# Patient Record
Sex: Female | Born: 1960 | Race: White | Hispanic: No | Marital: Married | Smoking: Never smoker
Health system: Southern US, Community
[De-identification: ages and names within clinical notes are randomized; demographics above are authoritative.]

## PROBLEM LIST (undated history)

## (undated) DIAGNOSIS — N809 Endometriosis, unspecified: Secondary | ICD-10-CM

## (undated) DIAGNOSIS — G43909 Migraine, unspecified, not intractable, without status migrainosus: Secondary | ICD-10-CM

## (undated) DIAGNOSIS — M3500 Sicca syndrome, unspecified: Secondary | ICD-10-CM

## (undated) DIAGNOSIS — Z87442 Personal history of urinary calculi: Secondary | ICD-10-CM

## (undated) DIAGNOSIS — F329 Major depressive disorder, single episode, unspecified: Secondary | ICD-10-CM

## (undated) DIAGNOSIS — D259 Leiomyoma of uterus, unspecified: Secondary | ICD-10-CM

## (undated) DIAGNOSIS — E039 Hypothyroidism, unspecified: Secondary | ICD-10-CM

## (undated) DIAGNOSIS — Z8669 Personal history of other diseases of the nervous system and sense organs: Secondary | ICD-10-CM

## (undated) DIAGNOSIS — K219 Gastro-esophageal reflux disease without esophagitis: Secondary | ICD-10-CM

## (undated) DIAGNOSIS — N9489 Other specified conditions associated with female genital organs and menstrual cycle: Secondary | ICD-10-CM

## (undated) DIAGNOSIS — R011 Cardiac murmur, unspecified: Secondary | ICD-10-CM

## (undated) DIAGNOSIS — M1990 Unspecified osteoarthritis, unspecified site: Secondary | ICD-10-CM

## (undated) DIAGNOSIS — G4700 Insomnia, unspecified: Secondary | ICD-10-CM

## (undated) DIAGNOSIS — M19072 Primary osteoarthritis, left ankle and foot: Secondary | ICD-10-CM

## (undated) DIAGNOSIS — F32A Depression, unspecified: Secondary | ICD-10-CM

## (undated) DIAGNOSIS — M19042 Primary osteoarthritis, left hand: Secondary | ICD-10-CM

## (undated) DIAGNOSIS — M9909 Segmental and somatic dysfunction of abdomen and other regions: Secondary | ICD-10-CM

## (undated) DIAGNOSIS — N29 Other disorders of kidney and ureter in diseases classified elsewhere: Secondary | ICD-10-CM

## (undated) DIAGNOSIS — N189 Chronic kidney disease, unspecified: Secondary | ICD-10-CM

## (undated) DIAGNOSIS — S82143A Displaced bicondylar fracture of unspecified tibia, initial encounter for closed fracture: Secondary | ICD-10-CM

## (undated) DIAGNOSIS — M19071 Primary osteoarthritis, right ankle and foot: Secondary | ICD-10-CM

## (undated) DIAGNOSIS — N950 Postmenopausal bleeding: Secondary | ICD-10-CM

## (undated) DIAGNOSIS — E559 Vitamin D deficiency, unspecified: Secondary | ICD-10-CM

## (undated) DIAGNOSIS — D219 Benign neoplasm of connective and other soft tissue, unspecified: Secondary | ICD-10-CM

## (undated) DIAGNOSIS — M170 Bilateral primary osteoarthritis of knee: Secondary | ICD-10-CM

## (undated) DIAGNOSIS — M19041 Primary osteoarthritis, right hand: Secondary | ICD-10-CM

## (undated) HISTORY — DX: Insomnia, unspecified: G47.00

## (undated) HISTORY — DX: Primary osteoarthritis, left hand: M19.042

## (undated) HISTORY — DX: Endometriosis, unspecified: N80.9

## (undated) HISTORY — DX: Gastro-esophageal reflux disease without esophagitis: K21.9

## (undated) HISTORY — DX: Other disorders of kidney and ureter in diseases classified elsewhere: N29

## (undated) HISTORY — DX: Hypothyroidism, unspecified: E03.9

## (undated) HISTORY — DX: Bilateral primary osteoarthritis of knee: M17.0

## (undated) HISTORY — DX: Vitamin D deficiency, unspecified: E55.9

## (undated) HISTORY — DX: Primary osteoarthritis, right hand: M19.041

## (undated) HISTORY — DX: Unspecified osteoarthritis, unspecified site: M19.90

## (undated) HISTORY — DX: Cardiac murmur, unspecified: R01.1

## (undated) HISTORY — DX: Primary osteoarthritis, left ankle and foot: M19.072

## (undated) HISTORY — DX: Benign neoplasm of connective and other soft tissue, unspecified: D21.9

## (undated) HISTORY — DX: Chronic kidney disease, unspecified: N18.9

## (undated) HISTORY — DX: Primary osteoarthritis, right ankle and foot: M19.071

## (undated) HISTORY — DX: Sicca syndrome, unspecified: M35.00

## (undated) HISTORY — DX: Major depressive disorder, single episode, unspecified: F32.9

## (undated) HISTORY — PX: DIAGNOSTIC LAPAROSCOPY: SUR761

## (undated) HISTORY — DX: Other disorders of calcium metabolism: E83.59

## (undated) HISTORY — DX: Depression, unspecified: F32.A

## (undated) HISTORY — DX: Migraine, unspecified, not intractable, without status migrainosus: G43.909

## (undated) HISTORY — DX: Displaced bicondylar fracture of unspecified tibia, initial encounter for closed fracture: S82.143A

---

## 1991-10-29 HISTORY — PX: PELVIC LAPAROSCOPY: SHX162

## 1992-10-28 HISTORY — PX: LAPAROSCOPY: SHX197

## 1999-01-17 ENCOUNTER — Encounter: Payer: Self-pay | Admitting: Gynecology

## 1999-01-17 ENCOUNTER — Ambulatory Visit (HOSPITAL_COMMUNITY): Admission: RE | Admit: 1999-01-17 | Discharge: 1999-01-17 | Payer: Self-pay | Admitting: Gynecology

## 2002-05-19 ENCOUNTER — Other Ambulatory Visit: Admission: RE | Admit: 2002-05-19 | Discharge: 2002-05-19 | Payer: Self-pay | Admitting: Gynecology

## 2003-02-21 ENCOUNTER — Encounter: Payer: Self-pay | Admitting: Gynecology

## 2003-02-21 ENCOUNTER — Ambulatory Visit (HOSPITAL_COMMUNITY): Admission: RE | Admit: 2003-02-21 | Discharge: 2003-02-21 | Payer: Self-pay | Admitting: Gynecology

## 2004-01-23 ENCOUNTER — Other Ambulatory Visit: Admission: RE | Admit: 2004-01-23 | Discharge: 2004-01-23 | Payer: Self-pay | Admitting: Gynecology

## 2004-04-06 ENCOUNTER — Ambulatory Visit (HOSPITAL_COMMUNITY): Admission: RE | Admit: 2004-04-06 | Discharge: 2004-04-06 | Payer: Self-pay | Admitting: Gynecology

## 2005-03-14 ENCOUNTER — Other Ambulatory Visit: Admission: RE | Admit: 2005-03-14 | Discharge: 2005-03-14 | Payer: Self-pay | Admitting: Gynecology

## 2006-09-26 ENCOUNTER — Ambulatory Visit (HOSPITAL_COMMUNITY): Admission: RE | Admit: 2006-09-26 | Discharge: 2006-09-26 | Payer: Self-pay | Admitting: Gynecology

## 2006-10-14 ENCOUNTER — Other Ambulatory Visit: Admission: RE | Admit: 2006-10-14 | Discharge: 2006-10-14 | Payer: Self-pay | Admitting: Gynecology

## 2008-01-08 ENCOUNTER — Ambulatory Visit (HOSPITAL_COMMUNITY): Admission: RE | Admit: 2008-01-08 | Discharge: 2008-01-08 | Payer: Self-pay | Admitting: Gynecology

## 2008-04-05 ENCOUNTER — Other Ambulatory Visit: Admission: RE | Admit: 2008-04-05 | Discharge: 2008-04-05 | Payer: Self-pay | Admitting: Gynecology

## 2009-02-14 ENCOUNTER — Ambulatory Visit (HOSPITAL_COMMUNITY): Admission: RE | Admit: 2009-02-14 | Discharge: 2009-02-14 | Payer: Self-pay | Admitting: Orthopedic Surgery

## 2010-11-12 ENCOUNTER — Ambulatory Visit (HOSPITAL_COMMUNITY)
Admission: RE | Admit: 2010-11-12 | Discharge: 2010-11-12 | Payer: Self-pay | Source: Home / Self Care | Attending: Unknown Physician Specialty | Admitting: Unknown Physician Specialty

## 2012-04-29 ENCOUNTER — Encounter: Payer: Self-pay | Admitting: Internal Medicine

## 2012-04-29 ENCOUNTER — Ambulatory Visit (INDEPENDENT_AMBULATORY_CARE_PROVIDER_SITE_OTHER): Payer: 59 | Admitting: Internal Medicine

## 2012-04-29 ENCOUNTER — Ambulatory Visit (HOSPITAL_BASED_OUTPATIENT_CLINIC_OR_DEPARTMENT_OTHER)
Admission: RE | Admit: 2012-04-29 | Discharge: 2012-04-29 | Disposition: A | Discharge status: Home / Self Care | Payer: 59 | Source: Ambulatory Visit | Attending: Internal Medicine | Admitting: Internal Medicine

## 2012-04-29 VITALS — BP 148/80 | HR 65 | Temp 97.3°F | Resp 16 | Ht 66.0 in | Wt 161.0 lb

## 2012-04-29 DIAGNOSIS — M2550 Pain in unspecified joint: Secondary | ICD-10-CM

## 2012-04-29 DIAGNOSIS — R454 Irritability and anger: Secondary | ICD-10-CM

## 2012-04-29 DIAGNOSIS — D72819 Decreased white blood cell count, unspecified: Secondary | ICD-10-CM

## 2012-04-29 DIAGNOSIS — J309 Allergic rhinitis, unspecified: Secondary | ICD-10-CM

## 2012-04-29 DIAGNOSIS — G5620 Lesion of ulnar nerve, unspecified upper limb: Secondary | ICD-10-CM

## 2012-04-29 DIAGNOSIS — K219 Gastro-esophageal reflux disease without esophagitis: Secondary | ICD-10-CM

## 2012-04-29 DIAGNOSIS — E039 Hypothyroidism, unspecified: Secondary | ICD-10-CM

## 2012-04-29 DIAGNOSIS — Z8669 Personal history of other diseases of the nervous system and sense organs: Secondary | ICD-10-CM

## 2012-04-29 DIAGNOSIS — Z87442 Personal history of urinary calculi: Secondary | ICD-10-CM

## 2012-04-29 DIAGNOSIS — Z1231 Encounter for screening mammogram for malignant neoplasm of breast: Secondary | ICD-10-CM | POA: Insufficient documentation

## 2012-04-29 DIAGNOSIS — Z139 Encounter for screening, unspecified: Secondary | ICD-10-CM

## 2012-04-29 LAB — TSH: TSH: 1.712 u[IU]/mL (ref 0.350–4.500)

## 2012-04-29 LAB — CBC WITH DIFFERENTIAL/PLATELET
Basophils Absolute: 0 10*3/uL (ref 0.0–0.1)
Basophils Relative: 0 % (ref 0–1)
Eosinophils Absolute: 0.1 10*3/uL (ref 0.0–0.7)
HCT: 40.5 % (ref 36.0–46.0)
MCHC: 33.6 g/dL (ref 30.0–36.0)
Monocytes Absolute: 0.4 10*3/uL (ref 0.1–1.0)
Neutro Abs: 2.6 10*3/uL (ref 1.7–7.7)
RDW: 13.1 % (ref 11.5–15.5)

## 2012-04-29 LAB — URIC ACID: Uric Acid, Serum: 2.5 mg/dL (ref 2.4–7.0)

## 2012-04-29 MED ORDER — NABUMETONE 500 MG PO TABS: 500.0000 mg | ORAL_TABLET | Freq: Two times a day (BID) | ORAL | Status: AC

## 2012-04-30 LAB — RHEUMATOID FACTOR: Rhuematoid fact SerPl-aCnc: <10 IU/mL (ref <=14)

## 2012-04-30 LAB — COMPLETE METABOLIC PANEL WITH GFR
AST: 15 U/L (ref 0–37)
Albumin: 4.6 g/dL (ref 3.5–5.2)
Alkaline Phosphatase: 73 U/L (ref 39–117)
BUN: 11 mg/dL (ref 6–23)
Creat: 0.52 mg/dL (ref 0.50–1.10)
Potassium: 4.5 mEq/L (ref 3.5–5.3)
Total Bilirubin: 0.3 mg/dL (ref 0.3–1.2)

## 2012-04-30 LAB — VITAMIN D 25 HYDROXY (VIT D DEFICIENCY, FRACTURES): Vit D, 25-Hydroxy: 31 ng/mL (ref 30–89)

## 2012-05-01 ENCOUNTER — Encounter: Payer: Self-pay | Admitting: Internal Medicine

## 2012-05-01 DIAGNOSIS — G5620 Lesion of ulnar nerve, unspecified upper limb: Secondary | ICD-10-CM | POA: Insufficient documentation

## 2012-05-01 DIAGNOSIS — R454 Irritability and anger: Secondary | ICD-10-CM | POA: Insufficient documentation

## 2012-05-01 DIAGNOSIS — K219 Gastro-esophageal reflux disease without esophagitis: Secondary | ICD-10-CM | POA: Insufficient documentation

## 2012-05-01 DIAGNOSIS — J309 Allergic rhinitis, unspecified: Secondary | ICD-10-CM | POA: Insufficient documentation

## 2012-05-01 DIAGNOSIS — Z87442 Personal history of urinary calculi: Secondary | ICD-10-CM | POA: Insufficient documentation

## 2012-05-01 DIAGNOSIS — D72819 Decreased white blood cell count, unspecified: Secondary | ICD-10-CM | POA: Insufficient documentation

## 2012-05-01 DIAGNOSIS — Z8669 Personal history of other diseases of the nervous system and sense organs: Secondary | ICD-10-CM | POA: Insufficient documentation

## 2012-05-01 DIAGNOSIS — E039 Hypothyroidism, unspecified: Secondary | ICD-10-CM | POA: Insufficient documentation

## 2012-05-01 NOTE — Progress Notes (Signed)
Subjective:    Patient ID: Shelly [name], female    DOB: 08-11-1961, 51 y.o.   MRN: [id number]  HPI Shelly [name] is here for first visit to establish primary care.  PMH of GERD controlled with Zantac,allergic rhinitis, irritability treated with Wellbutrin in the past,  Migraine headache treated with Excedrin Migraine, hypothyroidism diagnosed by blood test at age [age] yo., chronic low WBC per her report, R renal calculi in past, and chronic arthralgias.  She was told by her neurologist Dr. 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    Shelly [name] continues to have chronic arthralgias although she denies joint warmth or redness.  Occasionally fingers swell,  She does have an aunt with RA.  She has never seen a rheumatologist but would like to be referred to one.  She take Ibuprofen fairly regularly.    She also has noted 15 lb weight gain over last 1.5 years and would like her thyroid checked since it has been a while.  She is behind on preventive exams, such as pap and mammmogram  Allergies  Allergen Reactions  . Compazine (Prochlorperazine Edisylate) Other (See Comments)    Muscle contractions   Past Medical History  Diagnosis Date  . Depression   . Migraine   . GERD (gastroesophageal reflux disease)   . Hypothyroid    Past Surgical History  Procedure Date  . Laparoscopy 1994    endometriosis   History   Social History  . Marital Status: Married    Spouse Name: N/A    Number of Children: N/A  . Years of Education: N/A   Occupational History  . Not on file.   Social History Main Topics  . Smoking status: Never Smoker   . Smokeless tobacco: Never Used  . Alcohol Use: No  . Drug Use: No  . Sexually Active: Yes -- Female partner(s)   Other Topics Concern  . Not on file   Social History Narrative  . No narrative on file   Family History  Problem Relation Age of Onset  . Hypertension Mother   . Hypertension Father   . Hypertension Brother   .  Hypertension Maternal Grandmother   . Hypertension Maternal Grandfather   . Kidney failure Maternal Grandfather   . Aneurysm Maternal Grandfather   . Stroke Father   . Cancer Maternal Aunt 42    breast  . Cancer Cousin     breast   Patient Active Problem List  Diagnosis  . Irritability  . GERD (gastroesophageal reflux disease)  . History of migraine  . Hypothyroidism  . History of renal calculi  . Leukopenia  . Ulnar nerve entrapment at elbow   Current Outpatient Prescriptions on File Prior to Visit  Medication Sig Dispense Refill  . amitriptyline (ELAVIL) 10 MG tablet Take 10 mg by mouth at bedtime. Takes 30mg  @@ hs      . levothyroxine (SYNTHROID, LEVOTHROID) 75 MCG tablet Take 75 mcg by mouth daily.      . ranitidine (ZANTAC) 75 MG tablet Take 75 mg by mouth daily.           Review of Systems    see HPI Objective:   Physical Exam Physical Exam  Nursing note and vitals reviewed.  Constitutional: She is oriented to person, place, and time. She appears well-developed and well-nourished.  HENT:  Head: Normocephalic and atraumatic.  Cardiovascular: Normal rate and regular rhythm. Exam reveals no gallop and no friction rub.  No  murmur heard.  Pulmonary/Chest: Breath sounds normal. She has no wheezes. She has no rales.  Neurological: She is alert and oriented to person, place, and time.  Skin: Skin is warm and dry.  Psychiatric: She has a normal mood and affect. Her behavior is normal. M/S  No active synovitis to any joints              Assessment & Plan:  Chronic arthralgia  Will get arthritis panel and refer to rheumatolgy  Ok to take Relafen 500 mg bid with food and Zantac.  Advised of GI and renal long term side effect with NSAIDS .  She voices understanding  GERD  See above  Hypothyroid  Will check levels today  History of renal calculi  History of  Low WBC per pt  History of ulnar nerve entrapment  Will get mammgoram today and schedule CPE

## 2012-05-04 ENCOUNTER — Telehealth: Payer: Self-pay | Admitting: *Deleted

## 2012-05-04 NOTE — Telephone Encounter (Signed)
Copy of labs mailed to pt's home address. 

## 2012-05-13 ENCOUNTER — Other Ambulatory Visit: Payer: Self-pay | Admitting: *Deleted

## 2012-05-13 ENCOUNTER — Telehealth: Payer: Self-pay | Admitting: Internal Medicine

## 2012-05-13 DIAGNOSIS — E039 Hypothyroidism, unspecified: Secondary | ICD-10-CM

## 2012-05-13 MED ORDER — LEVOTHYROXINE SODIUM 75 MCG PO TABS: 75.0000 ug | ORAL_TABLET | Freq: Every day | ORAL | Status: DC

## 2012-05-13 NOTE — Telephone Encounter (Signed)
Pt states she had her blood work and would like to know if she can get a renewal on her Levothyroxine Sodium (Tab) SYNTHROID, LEVOTHROID 75 MCG Take 75 mcg by mouth daily. Please call her at [phone]... Thank s

## 2012-05-13 NOTE — Telephone Encounter (Signed)
Pt called requesting RF on Synthroid.  RX sent to [facility].

## 2012-05-26 ENCOUNTER — Ambulatory Visit (HOSPITAL_COMMUNITY)
Admission: RE | Admit: 2012-05-26 | Discharge: 2012-05-26 | Disposition: A | Discharge status: Home / Self Care | Payer: 59 | Source: Ambulatory Visit | Attending: Internal Medicine | Admitting: Internal Medicine

## 2012-05-26 ENCOUNTER — Ambulatory Visit (INDEPENDENT_AMBULATORY_CARE_PROVIDER_SITE_OTHER): Payer: 59 | Admitting: Internal Medicine

## 2012-05-26 VITALS — BP 128/76 | HR 88 | Temp 98.0°F | Resp 16 | Ht 66.0 in | Wt 161.0 lb

## 2012-05-26 DIAGNOSIS — R350 Frequency of micturition: Secondary | ICD-10-CM

## 2012-05-26 DIAGNOSIS — R1032 Left lower quadrant pain: Secondary | ICD-10-CM

## 2012-05-26 DIAGNOSIS — R82998 Other abnormal findings in urine: Secondary | ICD-10-CM

## 2012-05-26 DIAGNOSIS — N831 Corpus luteum cyst of ovary, unspecified side: Secondary | ICD-10-CM | POA: Insufficient documentation

## 2012-05-26 DIAGNOSIS — N949 Unspecified condition associated with female genital organs and menstrual cycle: Secondary | ICD-10-CM | POA: Insufficient documentation

## 2012-05-26 DIAGNOSIS — D259 Leiomyoma of uterus, unspecified: Secondary | ICD-10-CM | POA: Insufficient documentation

## 2012-05-26 DIAGNOSIS — R8281 Pyuria: Secondary | ICD-10-CM

## 2012-05-26 LAB — POCT URINALYSIS DIPSTICK
Glucose, UA: NEGATIVE
Ketones, UA: NEGATIVE
Spec Grav, UA: 1.01

## 2012-05-26 LAB — POCT UA - MICROSCOPIC ONLY

## 2012-05-26 MED ORDER — CIPROFLOXACIN HCL 250 MG PO TABS: 250.0000 mg | ORAL_TABLET | Freq: Two times a day (BID) | ORAL | Status: AC

## 2012-05-26 NOTE — Progress Notes (Signed)
Subjective:    Patient ID: Shelly [name], female    DOB: 12/14/1960, 51 y.o.   MRN: [id number]  HPIIntermittent left lower quadrant abdominal and pelvic pain for the past 5 days/worse in the past 24 hours Sometimes stabbing, sometimes colicky. For the past 3 days she has had dysuria and frequency without hematuria. No definite fever but some night sweats last night. History of kidney stones 15-20 years ago. Last menstrual period one week ago within normal limits/married one partner History of endometriosis with ablation several years ago/continues with dysmenorrhea No current vaginal discharge/at least one ovarian cysts in the past No diarrhea or constipation   Patient Active Problem List  Diagnosis  . Irritability  . GERD (gastroesophageal reflux disease)  . History of migraine  . Hypothyroidism  . History of renal calculi  . Leukopenia  . Ulnar nerve entrapment at elbow  . Allergic rhinitis due to allergen  Current outpatient prescriptions:amitriptyline (ELAVIL) 10 MG tablet, Take 10 mg by mouth at bedtime. Takes 30mg  @@ hs, Disp: , Rfl: ;  levothyroxine (SYNTHROID, LEVOTHROID) 75 MCG tablet, Take 1 tablet (75 mcg total) by mouth daily., Disp: 30 tablet, Rfl: 0;  nabumetone (RELAFEN) 500 MG tablet, Take 1 tablet (500 mg total) by mouth 2 (two) times daily., Disp: 60 tablet, Rfl: 1 ranitidine (ZANTAC) 75 MG tablet, Take 75 mg by mouth daily., Disp: , Rfl: ;   Review of Systems All systems as in present illness for staple    Objective:   Physical Exam Filed Vitals:   05/26/12 0927  BP: 128/76  Pulse: 88  Temp: 98 F (36.7 C)  Resp: 16  In no acute distress/she was in enough pain 30 minutes ago that she couldn't drive and her daughter had to bring her HEENT clear Heart regular without murmur Lungs clear Abdomen soft/bowel sounds present/no organomegaly Tender to palpation in the left lower quadrant with slight rebound/mildly tender suprapubic No costovertebral angle  tenderness        Results for orders placed in visit on 05/26/12  POCT UA - MICROSCOPIC ONLY      Component Value Range   WBC, Ur, HPF, POC 5-6     RBC, urine, microscopic 2-3     Bacteria, U Microscopic trace     Mucus, UA pos     Epithelial cells, urine per micros 1-2     Crystals, Ur, HPF, POC neg     Casts, Ur, LPF, POC neg     Yeast, UA neg    POCT URINALYSIS DIPSTICK      Component Value Range   Color, UA pale     Clarity, UA clear     Glucose, UA neg     Bilirubin, UA neg     Ketones, UA neg     Spec Grav, UA 1.010     Blood, UA trace     pH, UA 5.5     Protein, UA neg     Urobilinogen, UA 0.2     Nitrite, UA neg     Leukocytes, UA Trace      Assessment & Plan:   1. Urinary frequency  POCT UA - Microscopic Only, POCT urinalysis dipstick, Urine culture, US Pelvis Complete, US Transvaginal Non-OB  2. Abdominal pain, LLQ  US Pelvis Complete, US Transvaginal Non-OB   She will start Cipro 250 twice a day #20 and proceed to [place] long for pelvic ultrasound to rule out ovarian cyst Other considerations include urinary tract infection and kidney  stone at the UP junction

## 2012-05-28 ENCOUNTER — Encounter: Payer: Self-pay | Admitting: Internal Medicine

## 2012-05-28 LAB — URINE CULTURE
Colony Count: NO GROWTH
Organism ID, Bacteria: NO GROWTH

## 2012-06-04 ENCOUNTER — Encounter: Payer: Self-pay | Admitting: Internal Medicine

## 2012-06-04 ENCOUNTER — Ambulatory Visit (HOSPITAL_BASED_OUTPATIENT_CLINIC_OR_DEPARTMENT_OTHER)
Admission: RE | Admit: 2012-06-04 | Discharge: 2012-06-04 | Disposition: A | Discharge status: Home / Self Care | Payer: 59 | Source: Ambulatory Visit | Attending: Internal Medicine | Admitting: Internal Medicine

## 2012-06-04 ENCOUNTER — Ambulatory Visit (INDEPENDENT_AMBULATORY_CARE_PROVIDER_SITE_OTHER): Payer: 59 | Admitting: Internal Medicine

## 2012-06-04 VITALS — BP 130/86 | HR 83 | Temp 97.0°F | Resp 16 | Ht 66.0 in | Wt 161.0 lb

## 2012-06-04 DIAGNOSIS — R319 Hematuria, unspecified: Secondary | ICD-10-CM | POA: Insufficient documentation

## 2012-06-04 DIAGNOSIS — N201 Calculus of ureter: Secondary | ICD-10-CM | POA: Insufficient documentation

## 2012-06-04 DIAGNOSIS — N200 Calculus of kidney: Secondary | ICD-10-CM

## 2012-06-04 DIAGNOSIS — R350 Frequency of micturition: Secondary | ICD-10-CM

## 2012-06-04 DIAGNOSIS — N1330 Unspecified hydronephrosis: Secondary | ICD-10-CM | POA: Insufficient documentation

## 2012-06-04 LAB — POCT URINALYSIS DIPSTICK
Glucose, UA: NEGATIVE
Nitrite, UA: NEGATIVE
Protein, UA: NEGATIVE
Urobilinogen, UA: NEGATIVE

## 2012-06-04 LAB — BASIC METABOLIC PANEL
BUN: 16 mg/dL (ref 6–23)
Calcium: 8.9 mg/dL (ref 8.4–10.5)
Glucose, Bld: 86 mg/dL (ref 70–99)
Potassium: 4.7 mEq/L (ref 3.5–5.3)
Sodium: 137 mEq/L (ref 135–145)

## 2012-06-04 MED ORDER — OXYCODONE-ACETAMINOPHEN 5-325 MG PO TABS: 1.0000 | ORAL_TABLET | Freq: Three times a day (TID) | ORAL | Status: AC | PRN

## 2012-06-04 NOTE — Addendum Note (Signed)
Addended by: Raechel Chute D on: 06/04/2012 05:13 PM   Modules accepted: Orders

## 2012-06-04 NOTE — Progress Notes (Addendum)
Subjective:    Patient ID: Shelly [name], female    DOB: [birth year], 51 y.o.   MRN: [id number]  HPI Shelly [name] is here for acute visit.  She was evaluated at [facility] urgent care on 7/30 and treated with Cipro.  for UTI.  She continues to have LLQ pain radiating to suprapubic area.  Culture shows no growth.  She has history of kidney stones when she was 21 but no problems since  No fever No N/V.  Pain comes and goes and she is not in pain now.  Pt had plans to go out of town in 2 days  Allergies  Allergen Reactions  . Compazine (Prochlorperazine Edisylate) Other (See Comments)    Muscle contractions   Past Medical History  Diagnosis Date  . Depression   . Migraine   . GERD (gastroesophageal reflux disease)   . Hypothyroid    Past Surgical History  Procedure Date  . Laparoscopy 1994    endometriosis   History   Social History  . Marital Status: Married    Spouse Name: N/A    Number of Children: N/A  . Years of Education: N/A   Occupational History  . Not on file.   Social History Main Topics  . Smoking status: Never Smoker   . Smokeless tobacco: Never Used  . Alcohol Use: No  . Drug Use: No  . Sexually Active: Yes -- Female partner(s)   Other Topics Concern  . Not on file   Social History Narrative  . No narrative on file   Family History  Problem Relation Age of Onset  . Hypertension Mother   . Hypertension Father   . Hypertension Brother   . Hypertension Maternal Grandmother   . Hypertension Maternal Grandfather   . Kidney failure Maternal Grandfather   . Aneurysm Maternal Grandfather   . Stroke Father   . Cancer Maternal Aunt 42    breast  . Cancer Cousin     breast   Patient Active Problem List  Diagnosis  . Irritability  . GERD (gastroesophageal reflux disease)  . History of migraine  . Hypothyroidism  . History of renal calculi  . Leukopenia  . Ulnar nerve entrapment at elbow  . Allergic rhinitis due to allergen  . Renal calculus, left    Current Outpatient Prescriptions on File Prior to Visit  Medication Sig Dispense Refill  . amitriptyline (ELAVIL) 10 MG tablet Take 10 mg by mouth at bedtime. Takes 30mg  @@ hs      . ciprofloxacin (CIPRO) 250 MG tablet Take 1 tablet (250 mg total) by mouth 2 (two) times daily.  20 tablet  0  . levothyroxine (SYNTHROID, LEVOTHROID) 75 MCG tablet Take 1 tablet (75 mcg total) by mouth daily.  30 tablet  0  . nabumetone (RELAFEN) 500 MG tablet Take 1 tablet (500 mg total) by mouth 2 (two) times daily.  60 tablet  1  . ranitidine (ZANTAC) 75 MG tablet Take 75 mg by mouth daily.             Review of Systems See HPI    Objective:   Physical Exam Physical Exam  Nursing note and vitals reviewed.  Constitutional: She is oriented to person, place, and time. She appears well-developed and well-nourished.  HENT:  Head: Normocephalic and atraumatic.  Cardiovascular: Normal rate and regular rhythm. Exam reveals no gallop and no friction rub.  No murmur heard.  Pulmonary/Chest: Breath sounds normal. She has no wheezes. She has no rales.  ABD:  No CVA tenderness.  Mild tenderness cuprapubic area.  No rebound or peritoneal signs.  BS+ no HSM  Neurological: She is alert and oriented to person, place, and time.  Skin: Skin is warm and dry.  Psychiatric: She has a normal mood and affect. Her behavior is normal.              Assessment & Plan:  1)  ABd pain/ hematuria .  Will get ABd/pelvic CT today.  Suspect calculi  Check creatinine today Rx Percocet 5/325 1 q8 h prn  Hisotry of renal calculi   Addendum: See CT report  4 mm distal ureteral stone on L with mild hydronephrosis.  Pt notified via telephone and I spoke with Triage nurse at [facility] who will place pt on schedule in am.  Pt advised not to go to [facility] until evaluated by Urology  She voices understanding

## 2012-06-04 NOTE — Patient Instructions (Addendum)
To have CT today  Further plans based on results

## 2012-06-08 NOTE — Progress Notes (Signed)
Copy of labs mailed to pt's home address. 

## 2012-06-22 ENCOUNTER — Other Ambulatory Visit: Payer: Self-pay | Admitting: Internal Medicine

## 2012-06-22 DIAGNOSIS — E039 Hypothyroidism, unspecified: Secondary | ICD-10-CM

## 2012-06-22 MED ORDER — LEVOTHYROXINE SODIUM 75 MCG PO TABS: 75.0000 ug | ORAL_TABLET | Freq: Every day | ORAL | Status: DC

## 2012-06-22 NOTE — Telephone Encounter (Signed)
Pt needs refill for Levothyroxine 75 micrograms.  Needs 90 day supply for prescription. Call into [facility] [phone]. Pt phone number [phone].

## 2012-06-22 NOTE — Telephone Encounter (Signed)
Routed med RF to Dr Constance Goltz

## 2012-07-10 ENCOUNTER — Other Ambulatory Visit: Payer: Self-pay | Admitting: Urology

## 2012-07-21 ENCOUNTER — Ambulatory Visit (HOSPITAL_BASED_OUTPATIENT_CLINIC_OR_DEPARTMENT_OTHER): Admission: RE | Admit: 2012-07-21 | Payer: 59 | Source: Ambulatory Visit | Admitting: Urology

## 2012-07-21 ENCOUNTER — Encounter (HOSPITAL_BASED_OUTPATIENT_CLINIC_OR_DEPARTMENT_OTHER): Admission: RE | Payer: Self-pay | Source: Ambulatory Visit

## 2012-07-21 SURGERY — CYSTOURETEROSCOPY, WITH RETROGRADE PYELOGRAM AND STENT INSERTION
Anesthesia: General | Laterality: Left

## 2012-08-12 ENCOUNTER — Encounter: Payer: Self-pay | Admitting: Internal Medicine

## 2012-08-12 ENCOUNTER — Ambulatory Visit (INDEPENDENT_AMBULATORY_CARE_PROVIDER_SITE_OTHER): Payer: 59 | Admitting: Internal Medicine

## 2012-08-12 VITALS — BP 136/88 | HR 76 | Temp 97.7°F | Resp 16 | Wt 165.0 lb

## 2012-08-12 DIAGNOSIS — Z8249 Family history of ischemic heart disease and other diseases of the circulatory system: Secondary | ICD-10-CM | POA: Insufficient documentation

## 2012-08-12 DIAGNOSIS — K219 Gastro-esophageal reflux disease without esophagitis: Secondary | ICD-10-CM

## 2012-08-12 DIAGNOSIS — Z8669 Personal history of other diseases of the nervous system and sense organs: Secondary | ICD-10-CM

## 2012-08-12 DIAGNOSIS — Z0000 Encounter for general adult medical examination without abnormal findings: Secondary | ICD-10-CM

## 2012-08-12 DIAGNOSIS — Z124 Encounter for screening for malignant neoplasm of cervix: Secondary | ICD-10-CM

## 2012-08-12 DIAGNOSIS — Z1151 Encounter for screening for human papillomavirus (HPV): Secondary | ICD-10-CM

## 2012-08-12 DIAGNOSIS — J309 Allergic rhinitis, unspecified: Secondary | ICD-10-CM

## 2012-08-12 DIAGNOSIS — E039 Hypothyroidism, unspecified: Secondary | ICD-10-CM

## 2012-08-12 DIAGNOSIS — N200 Calculus of kidney: Secondary | ICD-10-CM

## 2012-08-12 LAB — POCT URINALYSIS DIPSTICK
Ketones, UA: NEGATIVE
Leukocytes, UA: NEGATIVE
Nitrite, UA: NEGATIVE
Protein, UA: NEGATIVE
pH, UA: 6.5

## 2012-08-12 LAB — LIPID PANEL
HDL: 49 mg/dL (ref >39)
LDL Cholesterol: 107 mg/dL — ABNORMAL HIGH (ref 0–99)

## 2012-08-12 NOTE — Progress Notes (Signed)
Subjective:    Patient ID: Shelly [name], female    DOB: 11/20/1960, 51 y.o.   MRN: [id number]  HPI [name] is here for comprehensive eval.   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  She did see Dr. Annabell Howells  She has seen Dr. Corliss Skains for her arthraligias  She is on high dose vitamind D now  She reports strong FH of AAA  GF, 2 uncles,  Cousin died of AAA and her aunt.    Migraine controlled with Excedrin migraine  Hypothyroidism  TSH normal in summer  Allergies  Allergen Reactions  . Compazine (Prochlorperazine Edisylate) Other (See Comments)    Muscle contractions   Past Medical History  Diagnosis Date  . Depression   . Migraine   . GERD (gastroesophageal reflux disease)   . Hypothyroid    Past Surgical History  Procedure Date  . Laparoscopy 1994    endometriosis   History   Social History  . Marital Status: Married    Spouse Name: N/A    Number of Children: N/A  . Years of Education: N/A   Occupational History  . Not on file.   Social History Main Topics  . Smoking status: Never Smoker   . Smokeless tobacco: Never Used  . Alcohol Use: No  . Drug Use: No  . Sexually Active: Yes -- Female partner(s)   Other Topics Concern  . Not on file   Social History Narrative  . No narrative on file   Family History  Problem Relation Age of Onset  . Hypertension Mother   . Hypertension Father   . Hypertension Brother   . Hypertension Maternal Grandmother   . Hypertension Maternal Grandfather   . Kidney failure Maternal Grandfather   . Aneurysm Maternal Grandfather   . Stroke Father   . Cancer Maternal Aunt 42    breast  . Cancer Cousin     breast   Patient Active Problem List  Diagnosis  . Irritability  . GERD (gastroesophageal reflux disease)  . History of migraine  . Hypothyroidism  . History of renal calculi  . Leukopenia  . Ulnar nerve entrapment at elbow  . Allergic rhinitis due to allergen  .  Renal calculus, left  . Family history of abdominal aortic aneurysm   Current Outpatient Prescriptions on File Prior to Visit  Medication Sig Dispense Refill  . amitriptyline (ELAVIL) 10 MG tablet Take 10 mg by mouth at bedtime. Takes 30mg  @@ hs      . levothyroxine (SYNTHROID, LEVOTHROID) 75 MCG tablet Take 1 tablet (75 mcg total) by mouth daily.  90 tablet  3  . nabumetone (RELAFEN) 500 MG tablet Take 1 tablet (500 mg total) by mouth 2 (two) times daily.  60 tablet  1  . ranitidine (ZANTAC) 75 MG tablet Take 75 mg by mouth daily.           Review of Systems  All other systems reviewed and are negative.       Objective:   Physical Exam  Physical Exam  Vital signs and nursing note reviewed  Constitutional: She is oriented to person, place, and time. She appears well-developed and well-nourished. She is cooperative.  HENT:  Head: Normocephalic and atraumatic.  Right Ear: Tympanic membrane normal.  Left Ear: Tympanic membrane normal.  Nose: Nose normal.  Mouth/Throat: Oropharynx is clear and moist and mucous membranes are normal. No oropharyngeal exudate or posterior oropharyngeal erythema.  Eyes: Conjunctivae and EOM are normal. Pupils are equal, round, and reactive to light.  Neck: Neck supple. No JVD present. Carotid bruit is not present. No mass and no thyromegaly present.  Cardiovascular: Regular rhythm, normal heart sounds, intact distal pulses and normal pulses.  Exam reveals no gallop and no friction rub.   No murmur heard. Pulses:      Dorsalis pedis pulses are 2+ on the right side, and 2+ on the left side.  Pulmonary/Chest: Breath sounds normal. She has no wheezes. She has no rhonchi. She has no rales. Right breast exhibits no mass, no nipple discharge and no skin change. Left breast exhibits no mass, no nipple discharge and no skin change.  Abdominal: Soft. Bowel sounds are normal. She exhibits no distension and no mass. There is no hepatosplenomegaly. There is no  tenderness. There is no CVA tenderness.  Genitourinary: Rectum normal, vagina normal and uterus normal. Rectal exam shows no mass. Guaiac negative stool. No labial fusion. There is no lesion on the right labia. There is no lesion on the left labia. Cervix exhibits no motion tenderness. Right adnexum displays no mass, no tenderness and no fullness. Left adnexum displays no mass, no tenderness and no fullness. No erythema around the vagina.  Musculoskeletal:       No active synovitis to any joint.    Lymphadenopathy:       Right cervical: No superficial cervical adenopathy present.      Left cervical: No superficial cervical adenopathy present.       Right axillary: No pectoral and no lateral adenopathy present.       Left axillary: No pectoral and no lateral adenopathy present.      Right: No inguinal adenopathy present.       Left: No inguinal adenopathy present.  Neurological: She is alert and oriented to person, place, and time. She has normal strength and normal reflexes. No cranial nerve deficit or sensory deficit. She displays a negative Romberg sign. Coordination and gait normal.  Skin: Skin is warm and dry. No abrasion, no bruising, no ecchymosis and no rash noted. No cyanosis. Nails show no clubbing.  Psychiatric: She has a normal mood and affect. Her speech is normal and behavior is normal.          Assessment & Plan:         Assessment & Plan:  Health Maintenance:  She will get flu vaccine by hospital.  UTD with mm pap today  See scanned HM sheet  Hypothyroidism  Continue  meds  Renal calculi  Managed by Dr. Annabell Howells  Arthralgias   Managed Dr. Corliss Skains  Will check Vitamin D today  Strong FH of AAA will get screening ultrasound  GERD  Continue  Zantac  If worsens she is to call

## 2012-08-12 NOTE — Patient Instructions (Addendum)
See me as needed  To have abd ultrasound  Stop by xray to schedule

## 2012-08-13 LAB — VITAMIN D 25 HYDROXY (VIT D DEFICIENCY, FRACTURES): Vit D, 25-Hydroxy: 47 ng/mL (ref 30–89)

## 2012-08-17 ENCOUNTER — Encounter: Payer: Self-pay | Admitting: *Deleted

## 2012-08-18 ENCOUNTER — Telehealth: Payer: Self-pay | Admitting: *Deleted

## 2012-08-18 ENCOUNTER — Encounter: Payer: Self-pay | Admitting: *Deleted

## 2012-08-18 NOTE — Telephone Encounter (Signed)
-   pap letter sent 

## 2012-08-18 NOTE — Telephone Encounter (Signed)
Message copied by Mathews Robinsons on Tue Aug 18, 2012 11:55 AM ------      Message from: Raechel Chute D      Created: Mon Aug 17, 2012  5:30 PM       Send pap letter that test was negative.  Repeat in two years

## 2012-08-19 ENCOUNTER — Telehealth: Payer: Self-pay | Admitting: *Deleted

## 2012-08-19 NOTE — Telephone Encounter (Signed)
-  pap letter sent to pt home address 

## 2012-08-31 ENCOUNTER — Telehealth: Payer: Self-pay | Admitting: *Deleted

## 2012-08-31 NOTE — Telephone Encounter (Signed)
Pt has not had [place] completed yet

## 2012-09-14 ENCOUNTER — Telehealth: Payer: Self-pay | Admitting: *Deleted

## 2012-09-14 ENCOUNTER — Ambulatory Visit (HOSPITAL_BASED_OUTPATIENT_CLINIC_OR_DEPARTMENT_OTHER)
Admission: RE | Admit: 2012-09-14 | Discharge: 2012-09-14 | Disposition: A | Discharge status: Home / Self Care | Payer: 59 | Source: Ambulatory Visit | Attending: Internal Medicine | Admitting: Internal Medicine

## 2012-09-14 ENCOUNTER — Other Ambulatory Visit: Payer: Self-pay | Admitting: Internal Medicine

## 2012-09-14 DIAGNOSIS — Z8249 Family history of ischemic heart disease and other diseases of the circulatory system: Secondary | ICD-10-CM

## 2012-09-14 NOTE — Telephone Encounter (Signed)
Informed pt - [place] report

## 2012-09-14 NOTE — Telephone Encounter (Signed)
Message copied by Mathews Robinsons on Mon Sep 14, 2012  3:08 PM ------      Message from: Raechel Chute D      Created: Mon Sep 14, 2012 12:34 PM       Call Inetta Fermo and let her know that her abd aortic ultrasound shows no aneurysm            Thanks

## 2012-10-07 ENCOUNTER — Other Ambulatory Visit: Payer: Self-pay | Admitting: *Deleted

## 2012-10-07 MED ORDER — FLUTICASONE PROPIONATE 50 MCG/ACT NA SUSP: 2.0000 | Freq: Every day | NASAL | Status: DC

## 2012-10-07 NOTE — Telephone Encounter (Signed)
Pt is also requesting fluticasone 50 mcg

## 2012-10-07 NOTE — Telephone Encounter (Signed)
Shelly [name]  Check to see if 25 mg of Elavil is OK

## 2012-10-08 ENCOUNTER — Telehealth: Payer: Self-pay | Admitting: *Deleted

## 2012-10-08 NOTE — Telephone Encounter (Signed)
Pt medication verified

## 2013-01-06 ENCOUNTER — Other Ambulatory Visit: Payer: Self-pay | Admitting: *Deleted

## 2013-01-06 NOTE — Telephone Encounter (Signed)
Shelly [name]   This pt has seen Dr. Corliss Skains for her joint pains.  Call pt and have her refill meds for her joint pains with Dr. Reginia Forts office  You can fax back refill request to pharmacy and write on request  "To be filled by Dr. Corliss Skains"

## 2013-01-06 NOTE — Telephone Encounter (Signed)
Refill request

## 2013-01-14 NOTE — Telephone Encounter (Signed)
Called in rx

## 2013-07-01 ENCOUNTER — Other Ambulatory Visit: Payer: Self-pay | Admitting: Internal Medicine

## 2013-07-01 NOTE — Telephone Encounter (Signed)
Refill request notified pt via Vm that she will need to have her labs drawn before we can refill more than 30

## 2013-07-02 ENCOUNTER — Other Ambulatory Visit: Payer: Self-pay | Admitting: *Deleted

## 2013-07-02 DIAGNOSIS — Z1329 Encounter for screening for other suspected endocrine disorder: Secondary | ICD-10-CM

## 2013-07-02 DIAGNOSIS — D72819 Decreased white blood cell count, unspecified: Secondary | ICD-10-CM

## 2013-07-02 DIAGNOSIS — Z1321 Encounter for screening for nutritional disorder: Secondary | ICD-10-CM

## 2013-07-02 DIAGNOSIS — Z13220 Encounter for screening for lipoid disorders: Secondary | ICD-10-CM

## 2013-07-02 DIAGNOSIS — E039 Hypothyroidism, unspecified: Secondary | ICD-10-CM

## 2013-07-02 NOTE — Telephone Encounter (Signed)
Notified pt via VM that refill for synthroid for 30 days and labs need to be drawn

## 2013-07-21 LAB — CBC WITH DIFFERENTIAL/PLATELET
Basophils Relative: 0 % (ref 0–1)
Eosinophils Relative: 1 % (ref 0–5)
HCT: 39.7 % (ref 36.0–46.0)
Hemoglobin: 13.4 g/dL (ref 12.0–15.0)
MCH: 32.4 pg (ref 26.0–34.0)
MCHC: 33.8 g/dL (ref 30.0–36.0)
MCV: 96.1 fL (ref 78.0–100.0)
Monocytes Absolute: 0.3 10*3/uL (ref 0.1–1.0)
Monocytes Relative: 8 % (ref 3–12)
Neutro Abs: 2.3 10*3/uL (ref 1.7–7.7)
RDW: 12.9 % (ref 11.5–15.5)

## 2013-07-21 LAB — COMPREHENSIVE METABOLIC PANEL
ALT: 29 U/L (ref 0–35)
AST: 20 U/L (ref 0–37)
Alkaline Phosphatase: 84 U/L (ref 39–117)
BUN: 16 mg/dL (ref 6–23)
Calcium: 9.3 mg/dL (ref 8.4–10.5)
Creat: 0.66 mg/dL (ref 0.50–1.10)
Total Bilirubin: 0.5 mg/dL (ref 0.3–1.2)

## 2013-07-21 LAB — LIPID PANEL
HDL: 58 mg/dL (ref >39)
Total CHOL/HDL Ratio: 3.2 Ratio
VLDL: 14 mg/dL (ref 0–40)

## 2013-07-22 LAB — VITAMIN D 25 HYDROXY (VIT D DEFICIENCY, FRACTURES): Vit D, 25-Hydroxy: 39 ng/mL (ref 30–89)

## 2013-07-23 ENCOUNTER — Encounter: Payer: Self-pay | Admitting: *Deleted

## 2013-09-29 ENCOUNTER — Other Ambulatory Visit: Payer: Self-pay | Admitting: Internal Medicine

## 2013-09-29 NOTE — Telephone Encounter (Signed)
Refill request

## 2013-10-28 DIAGNOSIS — Z860101 Personal history of adenomatous and serrated colon polyps: Secondary | ICD-10-CM

## 2013-10-28 DIAGNOSIS — D126 Benign neoplasm of colon, unspecified: Secondary | ICD-10-CM

## 2013-10-28 DIAGNOSIS — Z86010 Personal history of colonic polyps: Secondary | ICD-10-CM

## 2013-10-28 HISTORY — DX: Benign neoplasm of colon, unspecified: D12.6

## 2013-10-28 HISTORY — DX: Personal history of adenomatous and serrated colon polyps: Z86.0101

## 2013-10-28 HISTORY — DX: Personal history of colonic polyps: Z86.010

## 2013-11-03 ENCOUNTER — Ambulatory Visit (INDEPENDENT_AMBULATORY_CARE_PROVIDER_SITE_OTHER): Payer: 59 | Admitting: Internal Medicine

## 2013-11-03 ENCOUNTER — Encounter: Payer: Self-pay | Admitting: Internal Medicine

## 2013-11-03 VITALS — BP 132/84 | HR 80 | Temp 98.1°F | Resp 18 | Ht 65.5 in | Wt 169.0 lb

## 2013-11-03 DIAGNOSIS — L84 Corns and callosities: Secondary | ICD-10-CM

## 2013-11-03 DIAGNOSIS — E039 Hypothyroidism, unspecified: Secondary | ICD-10-CM

## 2013-11-03 DIAGNOSIS — Z0000 Encounter for general adult medical examination without abnormal findings: Secondary | ICD-10-CM

## 2013-11-03 DIAGNOSIS — R1310 Dysphagia, unspecified: Secondary | ICD-10-CM

## 2013-11-03 LAB — POCT URINALYSIS DIPSTICK
BILIRUBIN UA: NEGATIVE
GLUCOSE UA: NEGATIVE
Ketones, UA: NEGATIVE
LEUKOCYTES UA: NEGATIVE
NITRITE UA: NEGATIVE
Protein, UA: NEGATIVE
RBC UA: NEGATIVE
Spec Grav, UA: 1.015
Urobilinogen, UA: NEGATIVE
pH, UA: 6.5

## 2013-11-03 MED ORDER — PANTOPRAZOLE SODIUM 40 MG PO TBEC: 40.0000 mg | DELAYED_RELEASE_TABLET | Freq: Every day | ORAL | Status: DC

## 2013-11-03 NOTE — Progress Notes (Signed)
   Subjective:    Patient ID: Shelly [name], female    DOB: 1961-01-13, 53 y.o.   MRN: [id number]  HPI Shelly [name] is her for CPE.  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  She does report several week of difficulty with swallowing solids.  She does have GERD and takes omeprazole on occasion.  No weight loss No FH GI malignancy.  No N/V  She also reports she has thickened callus on plantar surface of her Left foot.  She would like to see a podiatrist  No problem with migraines recently.     Review of Systems  Cardiovascular: Negative for chest pain, palpitations and leg swelling.  Gastrointestinal: Negative for nausea, abdominal pain and abdominal distention.  All other systems reviewed and are negative.       Objective:   Physical Exam Physical Exam  Nursing note and vitals reviewed.  Constitutional: She is oriented to person, place, and time. She appears well-developed and well-nourished.  HENT:  Head: Normocephalic and atraumatic.  Right Ear: Tympanic membrane and ear canal normal. No drainage. Tympanic membrane is not injected and not erythematous.  Left Ear: Tympanic membrane and ear canal normal. No drainage. Tympanic membrane is not injected and not erythematous.  Nose: Nose normal. Right sinus exhibits no maxillary sinus tenderness and no frontal sinus tenderness. Left sinus exhibits no maxillary sinus tenderness and no frontal sinus tenderness.  Mouth/Throat: Oropharynx is clear and moist. No oral lesions. No oropharyngeal exudate.  Eyes: Conjunctivae and EOM are normal. Pupils are equal, round, and reactive to light.  Neck: Normal range of motion. Neck supple. No JVD present. Carotid bruit is not present. No mass and no thyromegaly present.  Cardiovascular: Normal rate, regular rhythm, S1 normal, S2 normal and intact distal pulses. Exam reveals no gallop and no friction rub.  No murmur heard.  Pulses:  Carotid pulses are 2+ on the right side, and 2+ on the left side.    Dorsalis pedis pulses are 2+ on the right side, and 2+ on the left side.  No carotid bruit. No LE edema  Pulmonary/Chest: Breath sounds normal. She has no wheezes. She has no rales. She exhibits no tenderness.  Abdominal: Soft. Bowel sounds are normal. She exhibits no distension and no mass. There is no hepatosplenomegaly. There is no tenderness. There is no CVA tenderness.  Musculoskeletal: Normal range of motion.  No active synovitis to joints.  Lymphadenopathy:  She has no cervical adenopathy.  She has no axillary adenopathy.  Right: No inguinal and no supraclavicular adenopathy present.  Left: No inguinal and no supraclavicular adenopathy present.  Neurological: She is alert and oriented to person, place, and time. She has normal strength and normal reflexes. She displays no tremor. No cranial nerve deficit or sensory deficit. Coordination and gait normal.  Skin: Skin is warm and dry. No rash noted. No cyanosis. Nails show no clubbing.  Psychiatric: She has a normal mood and affect. Her speech is normal and behavior is normal. Cognition and memory are normal.           Assessment & Plan:  Health Maintenance  [facility]  Will refer to screening colonoscopy  Dysphagia  Will schedule Barium swallow  GERD   RX protonix 40 mg daily  Hypothyroidism continue meds  Plantar callus left foot  Will refer to [facility]  Chronic mild leukopenia  Renal calculus  See me in 8 weeks or sooner prn

## 2013-11-03 NOTE — Patient Instructions (Signed)
Take protonix  40 mg daily  Will refer to GI for dysphagia and screening colonoscopy  Will schedule barium swallow  Will refer to Dr. Paulla Dolly podiatry  See me in 8-10 weeks  30 mins

## 2013-11-05 ENCOUNTER — Ambulatory Visit (HOSPITAL_COMMUNITY)
Admission: RE | Admit: 2013-11-05 | Discharge: 2013-11-05 | Disposition: A | Discharge status: Home / Self Care | Payer: 59 | Source: Ambulatory Visit | Attending: Internal Medicine | Admitting: Internal Medicine

## 2013-11-05 DIAGNOSIS — R1310 Dysphagia, unspecified: Secondary | ICD-10-CM | POA: Insufficient documentation

## 2013-11-05 DIAGNOSIS — K219 Gastro-esophageal reflux disease without esophagitis: Secondary | ICD-10-CM | POA: Insufficient documentation

## 2013-11-05 DIAGNOSIS — R12 Heartburn: Secondary | ICD-10-CM | POA: Insufficient documentation

## 2013-11-08 ENCOUNTER — Telehealth: Payer: Self-pay | Admitting: Internal Medicine

## 2013-11-08 NOTE — Telephone Encounter (Signed)
Left message for pt to call about her barium swallow results

## 2013-11-09 ENCOUNTER — Telehealth: Payer: Self-pay | Admitting: Internal Medicine

## 2013-11-09 NOTE — Telephone Encounter (Signed)
Spoke with pt and informed of barium swallow results  Advised to continue Protonix and keep appt with GI later this month

## 2013-11-17 ENCOUNTER — Other Ambulatory Visit: Payer: Self-pay | Admitting: Internal Medicine

## 2013-11-17 ENCOUNTER — Ambulatory Visit (HOSPITAL_COMMUNITY)
Admission: RE | Admit: 2013-11-17 | Discharge: 2013-11-17 | Disposition: A | Discharge status: Home / Self Care | Payer: 59 | Source: Ambulatory Visit | Attending: Internal Medicine | Admitting: Internal Medicine

## 2013-11-17 DIAGNOSIS — Z0000 Encounter for general adult medical examination without abnormal findings: Secondary | ICD-10-CM

## 2013-11-17 DIAGNOSIS — Z1231 Encounter for screening mammogram for malignant neoplasm of breast: Secondary | ICD-10-CM

## 2013-11-24 ENCOUNTER — Encounter: Payer: Self-pay | Admitting: Gastroenterology

## 2013-11-24 ENCOUNTER — Ambulatory Visit (INDEPENDENT_AMBULATORY_CARE_PROVIDER_SITE_OTHER): Payer: 59 | Admitting: Gastroenterology

## 2013-11-24 VITALS — BP 130/78 | HR 64 | Ht 65.5 in | Wt 170.2 lb

## 2013-11-24 DIAGNOSIS — R1319 Other dysphagia: Secondary | ICD-10-CM

## 2013-11-24 DIAGNOSIS — K219 Gastro-esophageal reflux disease without esophagitis: Secondary | ICD-10-CM

## 2013-11-24 DIAGNOSIS — Z1211 Encounter for screening for malignant neoplasm of colon: Secondary | ICD-10-CM

## 2013-11-24 MED ORDER — PEG-KCL-NACL-NASULF-NA ASC-C 100 G PO SOLR: 1.0000 | Freq: Once | ORAL | Status: DC

## 2013-11-24 NOTE — Patient Instructions (Signed)
You have been scheduled for an endoscopy and colonoscopy with propofol. Please follow the written instructions given to you at your visit today. Please pick up your prep at the pharmacy within the next 1-3 days. If you use inhalers (even only as needed), please bring them with you on the day of your procedure. Your physician has requested that you go to www.startemmi.com and enter the access code given to you at your visit today. This web site gives a general overview about your procedure. However, you should still follow specific instructions given to you by our office regarding your preparation for the procedure.  Patient advised to avoid spicy, acidic, citrus, chocolate, mints, fruit and fruit juices.  Limit the intake of caffeine, alcohol and Soda.  Don't exercise too soon after eating.  Don't lie down within 3-4 hours of eating.  Elevate the head of your bed.  Thank you for choosing me and [facility].  Pricilla Riffle. Dagoberto Ligas., MD., [facility]

## 2013-11-24 NOTE — Progress Notes (Signed)
    History of Present Illness: This is a 53 year old female who relates a 4 year history of recurrent reflux symptoms and intermittent solid food dysphasia. She has solid food dysphagia about every 2 months. She is taking Prilosec, ranitidine and most recently pantoprazole. PPI medications are generally very effective in controlling her reflux symptoms however ranitidine has not been completely effective. Esophagram performed earlier this month was negative. She occasionally has burning and gnawing epigastric pain. Denies weight loss, constipation, diarrhea, change in stool caliber, melena, hematochezia, nausea, vomiting, chest pain.  Review of Systems: Pertinent positive and negative review of systems were noted in the above HPI section. All other review of systems were otherwise negative.  Current Medications, Allergies, Past Medical History, Past Surgical History, Family History and Social History were reviewed in [employer] record.  Physical Exam: General: Well developed , well nourished, no acute distress Head: Normocephalic and atraumatic Eyes:  sclerae anicteric, EOMI Ears: Normal auditory acuity Mouth: No deformity or lesions Neck: Supple, no masses or thyromegaly Lungs: Clear throughout to auscultation Heart: Regular rate and rhythm; no murmurs, rubs or bruits Abdomen: Soft, non tender and non distended. No masses, hepatosplenomegaly or hernias noted. Normal Bowel sounds Rectal: Deferred to colonoscopy Musculoskeletal: Symmetrical with no gross deformities  Skin: No lesions on visible extremities Pulses:  Normal pulses noted Extremities: No clubbing, cyanosis, edema or deformities noted Neurological: Alert oriented x 4, grossly nonfocal Cervical Nodes:  No significant cervical adenopathy Inguinal Nodes: No significant inguinal adenopathy Psychological:  Alert and cooperative. Normal mood and affect  Assessment and Recommendations:  1. GERD and  dysphagia. Despite negative esophagram her symptoms are suggestive of an esophageal stricture. She may have an underlying esophageal motility disorder. Continue pantoprazole 40 mg daily and standard antireflux measures. Schedule endoscopy with dilation. The risks, benefits, and alternatives to endoscopy with possible biopsy and possible dilation were discussed with the patient and they consent to proceed.   2. Colorectal cancer screening, average risk. Schedule colonoscopy. The risks, benefits, and alternatives to colonoscopy with possible biopsy and possible polypectomy were discussed with the patient and they consent to proceed.

## 2013-12-17 ENCOUNTER — Other Ambulatory Visit: Payer: Self-pay | Admitting: Internal Medicine

## 2013-12-17 NOTE — Telephone Encounter (Signed)
Refill request last filled 12/13

## 2013-12-23 ENCOUNTER — Ambulatory Visit: Payer: 59 | Admitting: Podiatry

## 2014-01-05 ENCOUNTER — Encounter: Payer: Self-pay | Admitting: Internal Medicine

## 2014-01-05 ENCOUNTER — Ambulatory Visit (INDEPENDENT_AMBULATORY_CARE_PROVIDER_SITE_OTHER): Payer: Self-pay | Admitting: Internal Medicine

## 2014-01-05 VITALS — BP 114/71 | HR 76 | Temp 97.8°F | Resp 18 | Wt 171.0 lb

## 2014-01-05 DIAGNOSIS — N949 Unspecified condition associated with female genital organs and menstrual cycle: Secondary | ICD-10-CM

## 2014-01-05 DIAGNOSIS — E039 Hypothyroidism, unspecified: Secondary | ICD-10-CM

## 2014-01-05 DIAGNOSIS — K219 Gastro-esophageal reflux disease without esophagitis: Secondary | ICD-10-CM

## 2014-01-05 DIAGNOSIS — R109 Unspecified abdominal pain: Secondary | ICD-10-CM

## 2014-01-05 DIAGNOSIS — N938 Other specified abnormal uterine and vaginal bleeding: Secondary | ICD-10-CM

## 2014-01-05 LAB — CBC WITH DIFFERENTIAL/PLATELET
Basophils Absolute: 0 10*3/uL (ref 0.0–0.1)
Basophils Relative: 0 % (ref 0–1)
Eosinophils Absolute: 0 10*3/uL (ref 0.0–0.7)
Eosinophils Relative: 1 % (ref 0–5)
HCT: 40.2 % (ref 36.0–46.0)
HEMOGLOBIN: 13.4 g/dL (ref 12.0–15.0)
LYMPHS ABS: 1 10*3/uL (ref 0.7–4.0)
LYMPHS PCT: 27 % (ref 12–46)
MCH: 31.3 pg (ref 26.0–34.0)
MCHC: 33.3 g/dL (ref 30.0–36.0)
MCV: 93.9 fL (ref 78.0–100.0)
MONOS PCT: 12 % (ref 3–12)
Monocytes Absolute: 0.4 10*3/uL (ref 0.1–1.0)
NEUTROS ABS: 2.2 10*3/uL (ref 1.7–7.7)
NEUTROS PCT: 60 % (ref 43–77)
PLATELETS: 261 10*3/uL (ref 150–400)
RBC: 4.28 MIL/uL (ref 3.87–5.11)
RDW: 13 % (ref 11.5–15.5)
WBC: 3.7 10*3/uL — AB (ref 4.0–10.5)

## 2014-01-05 LAB — COMPREHENSIVE METABOLIC PANEL
ALBUMIN: 4.4 g/dL (ref 3.5–5.2)
ALT: 22 U/L (ref 0–35)
AST: 15 U/L (ref 0–37)
Alkaline Phosphatase: 65 U/L (ref 39–117)
BUN: 18 mg/dL (ref 6–23)
CHLORIDE: 107 meq/L (ref 96–112)
CO2: 20 meq/L (ref 19–32)
Calcium: 8.5 mg/dL (ref 8.4–10.5)
Creat: 0.56 mg/dL (ref 0.50–1.10)
GLUCOSE: 87 mg/dL (ref 70–99)
POTASSIUM: 4.5 meq/L (ref 3.5–5.3)
SODIUM: 137 meq/L (ref 135–145)
TOTAL PROTEIN: 6.9 g/dL (ref 6.0–8.3)
Total Bilirubin: 0.3 mg/dL (ref 0.2–1.2)

## 2014-01-05 LAB — TSH: TSH: 1.453 u[IU]/mL (ref 0.350–4.500)

## 2014-01-05 NOTE — Patient Instructions (Signed)
Will set up referral to Dr. Rolly Pancake    See me as

## 2014-01-05 NOTE — Progress Notes (Signed)
Subjective:    Patient ID: Shelly [name], female    DOB: [birth year], 53 y.o.   MRN: [id number]  HPI  Shelly [name] is here for follow up.  She tells me she is much improved after taking Protonix.    Esophagram normal but still had symtpoms that suggets  Stricture.  Her GI MD has upper endoscopy scheduled soon.    She reports she has had heavy menses last few months.  Changing tampon q2h..  3 days ago had suprapubic pain that lasted a few hours.  No dysuria, no urgency No N/V.  She reports she has endometriosis - TVUS in 2013 shows anterior fibroid.    Allergies  Allergen Reactions  . Compazine [Prochlorperazine Edisylate] Other (See Comments)    Muscle contractions   Past Medical History  Diagnosis Date  . Depression   . Migraine   . GERD (gastroesophageal reflux disease)   . Hypothyroid    Past Surgical History  Procedure Laterality Date  . Laparoscopy  1994    endometriosis   History   Social History  . Marital Status: Married    Spouse Name: N/A    Number of Children: 2  . Years of Education: N/A   Occupational History  . RN [employer]   Social History Main Topics  . Smoking status: Never Smoker   . Smokeless tobacco: Never Used  . Alcohol Use: No  . Drug Use: No  . Sexual Activity: Yes    Partners: Male   Other Topics Concern  . Not on file   Social History Narrative  . No narrative on file   Family History  Problem Relation Age of Onset  . Hypertension Mother   . Hypertension Father   . Hypertension Brother   . Hypertension Maternal Grandmother   . Hypertension Maternal Grandfather   . Kidney failure Maternal Grandfather   . Aneurysm Maternal Grandfather   . Stroke Father   . Cancer Maternal Aunt 42    breast  . Cancer Cousin     breast   Patient Active Problem List   Diagnosis Date Noted  . DUB (dysfunctional uterine bleeding) 01/05/2014  . Family history of abdominal aortic aneurysm 08/12/2012  . Renal calculus, left 06/04/2012  .  Irritability 05/01/2012  . GERD (gastroesophageal reflux disease) 05/01/2012  . History of migraine 05/01/2012  . Hypothyroidism 05/01/2012  . History of renal calculi 05/01/2012  . Leukopenia 05/01/2012  . 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 Allergic rhinitis due to allergen 05/01/2012   Current Outpatient Prescriptions on File Prior to Visit  Medication Sig Dispense Refill  . b complex vitamins tablet Take 1 tablet by mouth daily.      . cholecalciferol (VITAMIN D) 1000 UNITS tablet Take 2,000 Units by mouth daily.      . fluticasone (FLONASE) 50 MCG/ACT nasal spray Place 1 spray into both nostrils daily. As needed for four weeks then stop  16 g  0  . levothyroxine (SYNTHROID, LEVOTHROID) 75 MCG tablet TAKE 1 TABLET BY MOUTH ONCE DAILY  90 tablet  1  . nabumetone (RELAFEN) 500 MG tablet Take 500 mg by mouth 2 (two) times daily as needed.      . pantoprazole (PROTONIX) 40 MG tablet Take 1 tablet (40 mg total) by mouth daily.  30 tablet  3  . amitriptyline (ELAVIL) 10 MG tablet Take 25 mg by mouth at bedtime. Takes 25 mg      . peg 3350 powder (MOVIPREP)  100 G SOLR Take 1 kit (200 g total) by mouth once.  1 kit  0   No current facility-administered medications on file prior to visit.      Review of Systems    see HPI Objective:   Physical Exam Physical Exam  Nursing note and vitals reviewed.  Constitutional: She is oriented to person, place, and time. She appears well-developed and well-nourished.  HENT:  Head: Normocephalic and atraumatic.  Cardiovascular: Normal rate and regular rhythm. Exam reveals no gallop and no friction rub.  No murmur heard.  Pulmonary/Chest: Breath sounds normal. She has no wheezes. She has no rales. Abd:  BS + no HSM  nontender all quadrants to deep palpation.  No peritioneal signs  Neurological: She is alert and oriented to person, place, and time.  Skin: Skin is warm and dry.  Psychiatric: She has a normal mood and affect. Her behavior  is normal.       Assessment & Plan:  GERD/ subjective stricture symptoms.  Upper endoscopy pending  Continue Protonix 40 mg  Lower abd pain  U/A today normal  Etiology unclear - exam not worrisome.  Counseled if pain returns to see me in office. Pt voices understanding  DUB  May be anovulatory cycles but wilth history of endometriosis and fibroids will refer to GYN for further assessment.    Check TSH, CBC   Hypothyroidism  See above

## 2014-01-10 ENCOUNTER — Encounter: Payer: Self-pay | Admitting: *Deleted

## 2014-01-11 ENCOUNTER — Telehealth: Payer: Self-pay | Admitting: Obstetrics and Gynecology

## 2014-01-11 NOTE — Progress Notes (Signed)
Hello Deb,  I will look forward to seeing your patient.  Have a good week.  [place]

## 2014-01-11 NOTE — Telephone Encounter (Signed)
LMTCB to schedule a new patient doctor referral with Dr. Silva. °

## 2014-01-13 NOTE — Telephone Encounter (Signed)
LMTCB to schedule.

## 2014-01-14 ENCOUNTER — Encounter: Payer: Self-pay | Admitting: Gastroenterology

## 2014-01-14 ENCOUNTER — Ambulatory Visit (AMBULATORY_SURGERY_CENTER): Payer: Self-pay | Admitting: Gastroenterology

## 2014-01-14 VITALS — BP 111/73 | HR 51 | Temp 96.6°F | Resp 18 | Ht 65.5 in | Wt 170.0 lb

## 2014-01-14 DIAGNOSIS — D126 Benign neoplasm of colon, unspecified: Secondary | ICD-10-CM

## 2014-01-14 DIAGNOSIS — R1319 Other dysphagia: Secondary | ICD-10-CM

## 2014-01-14 DIAGNOSIS — K219 Gastro-esophageal reflux disease without esophagitis: Secondary | ICD-10-CM

## 2014-01-14 DIAGNOSIS — Z1211 Encounter for screening for malignant neoplasm of colon: Secondary | ICD-10-CM

## 2014-01-14 MED ORDER — SODIUM CHLORIDE 0.9 % IV SOLN: 500.0000 mL | INTRAVENOUS | Status: DC

## 2014-01-14 NOTE — Op Note (Addendum)
[place]  [address] [place], [address]   ENDOSCOPY PROCEDURE REPORT  PATIENT: Shelly [name], Shelly [name]  MR#: [id number] BIRTHDATE: [birth year] , 53  yrs. old GENDER: Female ENDOSCOPIST: Ladene Artist, MD, [facility] REFERRED BY:  Emi Belfast, M.D. PROCEDURE DATE:  01/14/2014 PROCEDURE:  EGD, diagnostic and Savary dilation of esophagus ASA CLASS:     Class II INDICATIONS:  Dysphagia.   History of esophageal reflux. MEDICATIONS: There was residual sedation effect present from prior procedure, MAC sedation, administered by CRNA, and propofol (Diprivan) 100mg  IV TOPICAL ANESTHETIC: none DESCRIPTION OF PROCEDURE: After the risks benefits and alternatives of the procedure were thoroughly explained, informed consent was obtained.  The LB [id number] endoscope was introduced through the mouth and advanced to the second portion of the duodenum. Without limitations.  The instrument was slowly withdrawn as the mucosa was fully examined.  ESOPHAGUS: A sublte stricture was found at the gastroesophageal junction.  The stenosis was traversable with the endoscope.   The esophagus was otherwise normal. STOMACH: Mild gastritis (patchy erythema) was found in the gastric antrum.   The stomach otherwise appeared normal. DUODENUM: Patchy erythema in the buld was noted. The duodenal mucosa showed no abnormalities in the second portion of the duodenum. Retroflexed views revealed no abnormalities. A guidewire was placed and the scope was then withdrawn from the patient. 17 mm Savary dilator was passed with no resistance and no heme noted  and the procedure completed.  COMPLICATIONS: There were no complications.  ENDOSCOPIC IMPRESSION: 1.   Stricture at the gastroesophageal junction; dilated 2.   Gastritis in the antrum 3.   Mild duodenitis  RECOMMENDATIONS: 1.  Anti-reflux regimen long term 2.  Continue PPI daily long term 3.  Post dilation instructions  eSigned:   Ladene Artist, MD, [facility] 01/14/2014 2:05 PM Revised: 01/14/2014 2:05 PM

## 2014-01-14 NOTE — Op Note (Signed)
[place]  [address] [place], [address]   COLONOSCOPY PROCEDURE REPORT  PATIENT: Shelly [name], Shelly [name]  MR#: [id number] BIRTHDATE: Sep 01, 1961 , 53  yrs. old GENDER: Female ENDOSCOPIST: Ladene Artist, MD, [facility] REFERRED PR:FFMBWGY Coralyn Mark, M.D. PROCEDURE DATE:  01/14/2014 PROCEDURE:   Colonoscopy with biopsy First Screening Colonoscopy - Avg.  risk and is 50 yrs.  old or older Yes.  Prior Negative Screening - Now for repeat screening. N/A  History of Adenoma - Now for follow-up colonoscopy & has been > or = to 3 yrs.  N/A  Polyps Removed Today? Yes. ASA CLASS:   Class II INDICATIONS:average risk screening. MEDICATIONS: MAC sedation, administered by CRNA and propofol (Diprivan) 300mg  IV DESCRIPTION OF PROCEDURE:   After the risks benefits and alternatives of the procedure were thoroughly explained, informed consent was obtained.  A digital rectal exam revealed no abnormalities of the rectum.   The LB [id number]  endoscope was introduced through the anus and advanced to the cecum, which was identified by both the appendix and ileocecal valve. No adverse events experienced with a tortuous colon.   The quality of the prep was excellent, using MoviPrep  The instrument was then slowly withdrawn as the colon was fully examined.  COLON FINDINGS: A sessile polyp measuring 3 mm in size was found in the transverse colon.  A polypectomy was performed with cold forceps.  The resection was complete and the polyp tissue was completely retrieved.   A sessile polyp measuring 5 mm in size was found in the sigmoid colon.  A polypectomy was performed with a cold snare.  The resection was complete and the polyp tissue was completely retrieved.   The colon was otherwise normal.  There was no diverticulosis, inflammation, polyps or cancers unless previously stated.  Retroflexed views revealed no abnormalities. The time to cecum=3 minutes 34 seconds.  Withdrawal time=12  minutes 30 seconds.  The scope was withdrawn and the procedure completed.  COMPLICATIONS: There were no complications.  ENDOSCOPIC IMPRESSION: 1.   Sessile polyp measuring 3 mm in the transverse colon; polypectomy performed with cold forceps 2.   Sessile polyp measuring 5 mm in the sigmoid colon; polypectomy performed with a cold snare  RECOMMENDATIONS: 1.  Await pathology results 2.  Repeat colonoscopy in 5 years if polyp(s) adenomatous; otherwise 10 years  eSigned:  Ladene Artist, MD, [facility] 01/14/2014 1:46 PM

## 2014-01-14 NOTE — Patient Instructions (Addendum)
YOU HAD AN ENDOSCOPIC PROCEDURE TODAY AT [facility]: Refer to the procedure report that was given to you for any specific questions about what was found during the examination.  If the procedure report does not answer your questions, please call your gastroenterologist to clarify.  If you requested that your care partner not be given the details of your procedure findings, then the procedure report has been included in a sealed envelope for you to review at your convenience later.  YOU SHOULD EXPECT: Some feelings of bloating in the abdomen. Passage of more gas than usual.  Walking can help get rid of the air that was put into your GI tract during the procedure and reduce the bloating. If you had a lower endoscopy (such as a colonoscopy or flexible sigmoidoscopy) you may notice spotting of blood in your stool or on the toilet paper. If you underwent a bowel prep for your procedure, then you may not have a normal bowel movement for a few days.  DIET: See dilation diet handout-  Drink plenty of fluids but you should avoid alcoholic beverages for 24 hours.  ACTIVITY: Your care partner should take you home directly after the procedure.  You should plan to take it easy, moving slowly for the rest of the day.  You can resume normal activity the day after the procedure however you should NOT DRIVE or use heavy machinery for 24 hours (because of the sedation medicines used during the test).    SYMPTOMS TO REPORT IMMEDIATELY: A gastroenterologist can be reached at any hour.  During normal business hours, 8:30 AM to 5:00 PM Monday through Friday, call [phone].  After hours and on weekends, please call the GI answering service at [phone] who will take a message and have the physician on call contact you.   Following lower endoscopy (colonoscopy or flexible sigmoidoscopy):  Excessive amounts of blood in the stool  Significant tenderness or worsening of abdominal pains  Swelling of  the abdomen that is new, acute  Fever of 100F or higher  Following upper endoscopy (EGD)  Vomiting of blood or coffee ground material  New chest pain or pain under the shoulder blades  Painful or persistently difficult swallowing  New shortness of breath  Fever of 100F or higher  Black, tarry-looking stools  FOLLOW UP: If any biopsies were taken you will be contacted by phone or by letter within the next 1-3 weeks.  Call your gastroenterologist if you have not heard about the biopsies in 3 weeks.  Our staff will call the home number listed on your records the next business day following your procedure to check on you and address any questions or concerns that you may have at that time regarding the information given to you following your procedure. This is a courtesy call and so if there is no answer at the home number and we have not heard from you through the emergency physician on call, we will assume that you have returned to your regular daily activities without incident.  SIGNATURES/CONFIDENTIALITY: You and/or your care partner have signed paperwork which will be entered into your electronic medical record.  These signatures attest to the fact that that the information above on your After Visit Summary has been reviewed and is understood.  Full responsibility of the confidentiality of this discharge information lies with you and/or your care-partner.  Polyps, dilation diet, stricture, gastritis, anti-reflux regimen-handouts given  Repeat colonoscopy will be determined by pathology.  Continue  protonix daily.

## 2014-01-14 NOTE — Progress Notes (Signed)
Called to room to assist during endoscopic procedure.  Patient ID and intended procedure confirmed with present staff. Received instructions for my participation in the procedure from the performing physician.  

## 2014-01-14 NOTE — Progress Notes (Signed)
Procedure ends, to recovery, report given and VSS. 

## 2014-01-17 ENCOUNTER — Telehealth: Payer: Self-pay | Admitting: *Deleted

## 2014-01-17 NOTE — Telephone Encounter (Signed)
LMTCB to schedule.

## 2014-01-17 NOTE — Telephone Encounter (Signed)
  Follow up Call-  Call back number 01/14/2014  Post procedure Call Back phone  # [phone]  Permission to leave phone message Yes     Patient questions:  Do you have a fever, pain , or abdominal swelling? no Pain Score  0 *  Have you tolerated food without any problems? yes  Have you been able to return to your normal activities? yes  Do you have any questions about your discharge instructions: Diet   no Medications  no Follow up visit  no  Do you have questions or concerns about your Care? no  Actions: * If pain score is 4 or above: No action needed, pain <4.  Information provided via husband,he stated that she was back to normal on Saturday.

## 2014-01-19 ENCOUNTER — Encounter: Payer: Self-pay | Admitting: Gastroenterology

## 2014-02-02 ENCOUNTER — Ambulatory Visit (INDEPENDENT_AMBULATORY_CARE_PROVIDER_SITE_OTHER): Payer: 59 | Admitting: Obstetrics and Gynecology

## 2014-02-02 ENCOUNTER — Encounter: Payer: Self-pay | Admitting: Obstetrics and Gynecology

## 2014-02-02 ENCOUNTER — Other Ambulatory Visit: Payer: Self-pay | Admitting: Internal Medicine

## 2014-02-02 VITALS — BP 122/80 | HR 76 | Resp 16 | Ht 66.0 in | Wt 170.5 lb

## 2014-02-02 DIAGNOSIS — D259 Leiomyoma of uterus, unspecified: Secondary | ICD-10-CM

## 2014-02-02 DIAGNOSIS — N920 Excessive and frequent menstruation with regular cycle: Secondary | ICD-10-CM

## 2014-02-02 DIAGNOSIS — D219 Benign neoplasm of connective and other soft tissue, unspecified: Secondary | ICD-10-CM

## 2014-02-02 NOTE — Addendum Note (Signed)
Addended by: Tacy Learn, BROOK E on: 02/02/2014 01:20 PM   Modules accepted: Orders

## 2014-02-02 NOTE — Patient Instructions (Addendum)
We will call you to schedule the sonohysterogram and endometrial biopsy.   Fibroids Fibroids are lumps (tumors) that can occur any place in a woman's body. These lumps are not cancerous. Fibroids vary in size, weight, and where they grow. HOME CARE  Do not take aspirin.  Write down the number of pads or tampons you use during your period. Tell your doctor. This can help determine the best treatment for you. GET HELP RIGHT AWAY IF:  You have pain in your lower belly (abdomen) that is not helped with medicine.  You have cramps that are not helped with medicine.  You have more bleeding between or during your period.  You feel lightheaded or pass out (faint).  Your lower belly pain gets worse. MAKE SURE YOU:  Understand these instructions.  Will watch your condition.  Will get help right away if you are not doing well or get worse. Document Released: 11/16/2010 Document Revised: 01/06/2012 Document Reviewed: 11/16/2010 [facility] Patient Information 2014 [place], [place].  Endometrial Biopsy Endometrial biopsy is a procedure in which a tissue sample is taken from inside the uterus. The tissue sample is then looked at under a microscope to see if the tissue is normal or abnormal. The endometrium is the lining of the uterus. This procedure helps determine where you are in your menstrual cycle and how hormone levels are affecting the lining of the uterus. This procedure may also be used to evaluate uterine bleeding or to diagnose endometrial cancer, tuberculosis, polyps, or inflammatory conditions.  LET [facility] CARE PROVIDER KNOW ABOUT:  Any allergies you have.  All medicines you are taking, including vitamins, herbs, eye drops, creams, and over-the-counter medicines.  Previous problems you or members of your family have had with the use of anesthetics.  Any blood disorders you have.  Previous surgeries you have had.  Medical conditions you have.  Possibility of  pregnancy. RISKS AND COMPLICATIONS Generally, this is a safe procedure. However, as with any procedure, complications can occur. Possible complications include:  Bleeding.  Pelvic infection.  Puncture of the uterine wall with the biopsy device (rare). BEFORE THE PROCEDURE   Keep a record of your menstrual cycles as directed by your health care provider. You may need to schedule your procedure for a specific time in your cycle.  You may want to bring a sanitary pad to wear home after the procedure.  Arrange for someone to drive you home after the procedure if you will be given a medicine to help you relax (sedative). PROCEDURE   You may be given a sedative to relax you.  You will lie on an exam table with your feet and legs supported as in a pelvic exam.  Your health care provider will insert an instrument (speculum) into your vagina to see your cervix.  Your cervix will be cleansed with an antiseptic solution. A medicine (local anesthetic) will be used to numb the cervix.  A forceps instrument (tenaculum) will be used to hold your cervix steady for the biopsy.  A thin, rodlike instrument (uterine sound) will be inserted through your cervix to determine the length of your uterus and the location where the biopsy sample will be removed.  A thin, flexible tube (catheter) will be inserted through your cervix and into the uterus. The catheter is used to collect the biopsy sample from your endometrial tissue.  The catheter and speculum will then be removed, and the tissue sample will be sent to a lab for examination. AFTER THE PROCEDURE  You will rest in a recovery area until you are ready to go home.  You may have mild cramping and a small amount of vaginal bleeding for a few days after the procedure. This is normal.  Make sure you find out how to get your test results. Document Released: 02/14/2005 Document Revised: 06/16/2013 Document Reviewed: 03/31/2013 [facility] Patient  Information 2014 [place], [place].

## 2014-02-02 NOTE — Progress Notes (Signed)
Patient ID: RICCI PAFF, female   DOB: [birth year], 53 y.o.   MRN: [id number] GYNECOLOGY VISIT  PCP:  Emi Belfast, MD  Referring provider:   HPI: 53 y.o.   Married  Caucasian  female   [id number] with Patient's last menstrual period was 01/09/2014.   here for  Abnormal uterine bleeding.  No menses from August to November, 2014.  Menses monthly December - March.   No cycles yet this month and having hot flashes.  Then returned and are heavy.  Changing tampon every 2 hours.  Last 5 - 7 days.  Last menstruation was painful during the premenstrual period.   History of fibroids and endometriosis. Last ultrasound in 2013.   Fibroids were noted from an ultrasound done at [facility] when had problems with kidney stones.  Two fibroids noted - one anterior 3.2 cm and one posterior 2.7 cm.  Left CL cyst 2.8 cm and normal right ovary.  Hypothyroidism, on replacement. TSH normal 01/05/14.  History of painful menses since starting menstruation.  Cannot take Advil due to gastritis symptoms.  Had a diagnosis of gastritis and duodenitis.   Also had arthritis. Takes Relafen.   Cold intolerance.  Cannot control temperature of extremities.  Asking about doing a full panel of thyroid function.  Has never done thyroid antibodies.  Weight gain.  Hair thinning.   Hgb 13.4 on 01/05/14.  Hgb:  PCP  GYNECOLOGIC HISTORY: Patient's last menstrual period was 01/09/2014. Sexually active:  yes Partner preference: female Contraception:  none  Menopausal hormone therapy: n/a DES exposure:  no  Blood transfusions:  no  Sexually transmitted diseases:   no GYN procedures and prior surgeries:  Laparoscopy due to endometriosis in 1993. Last mammogram:11-17-13 wnl:[facility]                Last pap and high risk HPV testing:   07/2012 wnl:neg HR HPV. History of abnormal pap smear: no    OB History   Grav Para Term Preterm Abortions TAB SAB Ect Mult Living   0 0 0 0 0 0 0 0 0 0      Obstetric Comments   Has 2  adopted children       LIFESTYLE: Exercise:   no           Tobacco:   no Alcohol:      no Drug use:  no  Patient Active Problem List   Diagnosis Date Noted  . DUB (dysfunctional uterine bleeding) 01/05/2014  . Family history of abdominal aortic aneurysm 08/12/2012  . Renal calculus, left 06/04/2012  . Irritability 05/01/2012  . GERD (gastroesophageal reflux disease) 05/01/2012  . History of migraine 05/01/2012  . Hypothyroidism 05/01/2012  . History of renal calculi 05/01/2012  . Leukopenia 05/01/2012  . 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 Allergic rhinitis due to allergen 05/01/2012    Past Medical History  Diagnosis Date  . Depression   . Migraine   . GERD (gastroesophageal reflux disease)   . Hypothyroid   . Chronic kidney disease     kidney stones  . Endometriosis   . Fibroid   . Heart murmur     Past Surgical History  Procedure Laterality Date  . Laparoscopy  1994    endometriosis  . Pelvic laparoscopy  1993    d/t endometriosis--Dr. Matthew Saras    Current Outpatient Prescriptions  Medication Sig Dispense Refill  . amitriptyline (ELAVIL) 10 MG tablet Take 25 mg by mouth at bedtime. Takes 25 mg      .  b complex vitamins tablet Take 1 tablet by mouth daily.      . calcium-vitamin D 250-100 MG-UNIT per tablet Take 1 tablet by mouth 2 (two) times daily.      . cholecalciferol (VITAMIN D) 1000 UNITS tablet Take 2,000 Units by mouth daily.      . fluticasone (FLONASE) 50 MCG/ACT nasal spray Place 1 spray into both nostrils daily. As needed for four weeks then stop  16 g  0  . Ginger, Zingiber officinalis, (GINGER PO) Take 1 tablet by mouth daily.      Marland Kitchen levothyroxine (SYNTHROID, LEVOTHROID) 75 MCG tablet TAKE 1 TABLET BY MOUTH ONCE DAILY  90 tablet  1  . nabumetone (RELAFEN) 500 MG tablet Take 500 mg by mouth 2 (two) times daily as needed.      . NON FORMULARY Take 1 tablet by mouth daily. Tumeric      . pantoprazole (PROTONIX) 40 MG tablet Take 1 tablet  (40 mg total) by mouth daily.  30 tablet  3   No current facility-administered medications for this visit.     ALLERGIES: Compazine  Family History  Problem Relation Age of Onset  . Hypertension Mother   . Hypertension Father   . Stroke Father   . Diabetes Father   . Hypertension Brother   . Hypertension Maternal Grandmother   . Hypertension Maternal Grandfather   . Kidney failure Maternal Grandfather   . Aneurysm Maternal Grandfather   . Cancer Maternal Aunt 42    breast  . Cancer Cousin     breast    History   Social History  . Marital Status: Married    Spouse Name: N/A    Number of Children: 2  . Years of Education: N/A   Occupational History  . RN [employer]   Social History Main Topics  . Smoking status: Never Smoker   . Smokeless tobacco: Never Used  . Alcohol Use: No  . Drug Use: No  . Sexual Activity: Yes    Partners: Male    Birth Control/ Protection: None   Other Topics Concern  . Not on file   Social History Narrative  . No narrative on file   [employer];  Nurse in NICU at [place]:  Pertinent items are noted in HPI.  PHYSICAL EXAMINATION:    BP 122/80  Pulse 76  Resp 16  Ht 5\' 6"  (1.676 m)  Wt 170 lb 8 oz (77.338 kg)  BMI 27.53 kg/m2  LMP 01/09/2014   Wt Readings from Last 3 Encounters:  02/02/14 170 lb 8 oz (77.338 kg)  01/14/14 170 lb (77.111 kg)  01/05/14 171 lb (77.565 kg)     Ht Readings from Last 3 Encounters:  02/02/14 5\' 6"  (1.676 m)  01/14/14 5' 5.5" (1.664 m)  11/24/13 5' 5.5" (1.664 m)    General appearance: alert, cooperative and appears stated age Head: Normocephalic, without obvious abnormality, atraumatic Neck: no adenopathy, supple, symmetrical, trachea midline and thyroid not enlarged, symmetric, no tenderness/mass/nodules Lungs: clear to auscultation bilaterally Heart: regular rate and rhythm Abdomen: soft, non-tender; no masses,  no organomegaly No abnormal inguinal nodes palpated Neurologic:  Grossly normal  Pelvic: External genitalia:  no lesions              Urethra:  normal appearing urethra with no masses, tenderness or lesions              Bartholins and Skenes: normal  Vagina: normal appearing vagina with normal color and discharge, no lesions              Cervix: normal appearance                 Bimanual Exam:  Uterus:  uterus is normal size, shape, consistency and nontender                                      Adnexa: normal adnexa in size, nontender and no masses                                      Rectovaginal: Confirms                                      Anus:  normal sphincter tone, no lesions  ASSESSMENT  Menorrhagia. Uterine fibroids.  History of endometriosis and infertility.  Hypothyroidism.  On Synthroid. Possible Raynaud's. Perimenopausal female.  History of gastritis and duodenitis.   PLAN  Written nformation to patient about fibroids.  Return for sonohysterogrm and endometrial biopsy.  Plan to follow depending on results from evaluation.  I discussed briefly OCPs, IUD, Lysteda as possible options.  Patient will discuss her hypothyroidism and intolerance to cold with Dr. Coralyn Mark or her Rheumatologist.   An After Visit Summary was printed and given to the patient.  45 minutes face to face time of which over 50% was spent in counseling.

## 2014-02-02 NOTE — Telephone Encounter (Signed)
Refill request

## 2014-02-04 ENCOUNTER — Telehealth: Payer: Self-pay | Admitting: Obstetrics and Gynecology

## 2014-02-04 NOTE — Telephone Encounter (Signed)
Left message for patient to call back. Need to go over benefits and schedule SHGM. °

## 2014-02-04 NOTE — Telephone Encounter (Signed)
Patient returned call. I advised patient of $25 copay quote received for [facility]. Scheduled SHGM. Advised patient of cancellation policy and cancellation fee. Patient agreeable. Mailed the In-Office procedure form that includes appointment date and time, patient copay, and cancellation policy.

## 2014-02-17 ENCOUNTER — Ambulatory Visit (INDEPENDENT_AMBULATORY_CARE_PROVIDER_SITE_OTHER): Payer: 59 | Admitting: Podiatrist

## 2014-02-17 ENCOUNTER — Encounter: Payer: Self-pay | Admitting: Podiatrist

## 2014-02-17 ENCOUNTER — Ambulatory Visit (INDEPENDENT_AMBULATORY_CARE_PROVIDER_SITE_OTHER): Payer: 59

## 2014-02-17 VITALS — BP 136/79 | HR 74 | Resp 16 | Ht 66.0 in | Wt 170.0 lb

## 2014-02-17 DIAGNOSIS — M2040 Other hammer toe(s) (acquired), unspecified foot: Secondary | ICD-10-CM

## 2014-02-17 DIAGNOSIS — M216X9 Other acquired deformities of unspecified foot: Secondary | ICD-10-CM

## 2014-02-17 DIAGNOSIS — M722 Plantar fascial fibromatosis: Secondary | ICD-10-CM

## 2014-02-17 DIAGNOSIS — M7660 Achilles tendinitis, unspecified leg: Secondary | ICD-10-CM

## 2014-02-17 DIAGNOSIS — Q828 Other specified congenital malformations of skin: Secondary | ICD-10-CM

## 2014-02-17 NOTE — Patient Instructions (Signed)

## 2014-02-17 NOTE — Progress Notes (Signed)
   Subjective:    Patient ID: Shelly [name], female    DOB: 1961/07/19, 53 y.o.   MRN: [id number]  HPI Comments: N callous L left 2nd MPJ plantar D 2 years on and off O began after an episode of plantar fasciitis C hard, painful skin A walking or weightbearing T home pedicure     Review of Systems  Musculoskeletal: Positive for arthralgias.       Overall arthritis  All other systems reviewed and are negative.      Objective:   Physical Exam Neurovascular status is intact with palpable pedal pulses at 2/4 DP and PT bilateral and capillary refill time within normal limits. Neurological sensation is also intact bilaterally both upper critically and protectively. Musculoskeletal examination reveals good muscle, strength, tone and stability. Musculature intact with dorsiflexion, plantarflexion, inversion, eversion. Plantarflexed 2nd metatarsal head is present.  Hyperkeratotic lesion present submet 2 left foot.  Intact integument is present post debridement.      Assessment & Plan:  Prominent metatarsal; hyperkeratotic lesion x 1  Plan:  Debridement of the lesion is carried out.  Recommended orthotics to ofload the lesion and help suppport the foot to prevent plantar fasciitis from recurring.  i will see her back when these are ready for pick up.

## 2014-02-23 ENCOUNTER — Telehealth: Payer: Self-pay | Admitting: Obstetrics and Gynecology

## 2014-02-23 NOTE — Telephone Encounter (Signed)
Spoke with patient. Message from [place] given as seen below. SHGM scheduled for next Thursday 4/7 at 8:00am with an 8:45 consult with Dr.Silva (time per Gay Filler). Patient agreeable to date and time. Will call back with any further needs, questions or concerns.  Routing to provider for final review. Patient agreeable to disposition. Will close encounter.

## 2014-02-23 NOTE — Telephone Encounter (Signed)
Patient is scheduled for a SHSG 02/24/14. She started her menstrual cycle last night. Is she fine to keep the appointment.

## 2014-02-23 NOTE — Telephone Encounter (Signed)
Left message to call [name] at [phone]. 

## 2014-02-23 NOTE — Telephone Encounter (Signed)
Dr.Silva, patient started cycle yesterday and is scheduled for [facility] tomorrow. Not on birth control.Patient states that she is not bleeding heavily and that "It is like a normal period." Patient states she is comfortable coming in tomorrow for procedure if it is okay with Dr.Silva. Advised would send Dr.Silva a message regarding appointment date and time and would give patient a call back. Patient agreeable.   Dr.Silva, okay for patient to keep [facility] appointment tomorrow or would you like her to schedule for next week?

## 2014-02-23 NOTE — Telephone Encounter (Signed)
Dr. Quincy Simmonds, patient started cycle yesterday and is scheduled for [facility] tomorrow. Not on birth control. SHGM for

## 2014-02-23 NOTE — Telephone Encounter (Signed)
[name],  Reschedule for next week if possible. If no time slot for this, proceed with visit tomorrow.   CC - Shelly [name]

## 2014-02-24 ENCOUNTER — Other Ambulatory Visit: Payer: 59 | Admitting: Obstetrics and Gynecology

## 2014-02-24 ENCOUNTER — Other Ambulatory Visit: Payer: 59

## 2014-03-03 ENCOUNTER — Encounter: Payer: Self-pay | Admitting: Obstetrics and Gynecology

## 2014-03-03 ENCOUNTER — Ambulatory Visit (INDEPENDENT_AMBULATORY_CARE_PROVIDER_SITE_OTHER): Payer: 59

## 2014-03-03 ENCOUNTER — Other Ambulatory Visit: Payer: Self-pay | Admitting: Obstetrics and Gynecology

## 2014-03-03 ENCOUNTER — Ambulatory Visit (INDEPENDENT_AMBULATORY_CARE_PROVIDER_SITE_OTHER): Payer: 59 | Admitting: Obstetrics and Gynecology

## 2014-03-03 VITALS — Ht 66.0 in | Wt 168.0 lb

## 2014-03-03 DIAGNOSIS — N920 Excessive and frequent menstruation with regular cycle: Secondary | ICD-10-CM

## 2014-03-03 DIAGNOSIS — D259 Leiomyoma of uterus, unspecified: Secondary | ICD-10-CM

## 2014-03-03 DIAGNOSIS — D219 Benign neoplasm of connective and other soft tissue, unspecified: Secondary | ICD-10-CM

## 2014-03-03 NOTE — Patient Instructions (Addendum)
Norethindrone tablets (contraception) What is this medicine? NORETHINDRONE (nor eth IN drone) is an oral contraceptive. The product contains a female hormone known as a progestin. It is used to prevent pregnancy. This medicine may be used for other purposes; ask your health care provider or pharmacist if you have questions. COMMON BRAND NAME(S): Camila, [place]in , [place], [place], [place] , [place], Nor-QD, Nora-BE, Ortho Micronor What should I tell my health care provider before I take this medicine? They need to know if you have any of these conditions: -blood vessel disease or blood clots -breast, cervical, or vaginal cancer -diabetes -heart disease -kidney disease -liver disease -mental depression -migraine -seizures -stroke -vaginal bleeding -an unusual or allergic reaction to norethindrone, other medicines, foods, dyes, or preservatives -pregnant or trying to get pregnant -breast-feeding How should I use this medicine? Take this medicine by mouth with a glass of water. You may take it with or without food. Follow the directions on the prescription label. Take this medicine at the same time each day and in the order directed on the package. Do not take your medicine more often than directed. Contact your pediatrician regarding the use of this medicine in children. Special care may be needed. This medicine has been used in female children who have started having menstrual periods. A patient package insert for the product will be given with each prescription and refill. Read this sheet carefully each time. The sheet may change frequently. Overdosage: If you think you have taken too much of this medicine contact a poison control center or emergency room at once. NOTE: This medicine is only for you. Do not share this medicine with others. What if I miss a dose? Try not to miss a dose. Every time you miss a dose or take a dose late your chance of pregnancy increases. When 1 pill is missed  (even if only 3 hours late), take the missed pill as soon as possible and continue taking a pill each day at the regular time (use a back up method of birth control for the next 48 hours). If more than 1 dose is missed, use an additional birth control method for the rest of your pill pack until menses occurs. Contact your health care professional if more than 1 dose has been missed. What may interact with this medicine? Do not take this medicine with any of the following medications: -amprenavir or fosamprenavir -bosentan This medicine may also interact with the following medications: -antibiotics or medicines for infections, especially rifampin, rifabutin, rifapentine, and griseofulvin, and possibly penicillins or tetracyclines -aprepitant -barbiturate medicines, such as phenobarbital -carbamazepine -felbamate -modafinil -oxcarbazepine -phenytoin -ritonavir or other medicines for HIV infection or AIDS -St. John's wort -topiramate This list may not describe all possible interactions. Give your health care provider a list of all the medicines, herbs, non-prescription drugs, or dietary supplements you use. Also tell them if you smoke, drink alcohol, or use illegal drugs. Some items may interact with your medicine. What should I watch for while using this medicine? Visit your doctor or health care professional for regular checks on your progress. You will need a regular breast and pelvic exam and Pap smear while on this medicine. Use an additional method of birth control during the first cycle that you take these tablets. If you have any reason to think you are pregnant, stop taking this medicine right away and contact your doctor or health care professional. If you are taking this medicine for hormone related problems, it may take several  cycles of use to see improvement in your condition. This medicine does not protect you against HIV infection (AIDS) or any other sexually transmitted  diseases. What side effects may I notice from receiving this medicine? Side effects that you should report to your doctor or health care professional as soon as possible: -breast tenderness or discharge -pain in the abdomen, chest, groin or leg -severe headache -skin rash, itching, or hives -sudden shortness of breath -unusually weak or tired -vision or speech problems -yellowing of skin or eyes Side effects that usually do not require medical attention (report to your doctor or health care professional if they continue or are bothersome): -changes in sexual desire -change in menstrual flow -facial hair growth -fluid retention and swelling -headache -irritability -nausea -weight gain or loss This list may not describe all possible side effects. Call your doctor for medical advice about side effects. You may report side effects to FDA at 1-800-FDA-1088. Where should I keep my medicine? Keep out of the reach of children. Store at room temperature between 15 and 30 degrees C (59 and 86 degrees F). Throw away any unused medicine after the expiration date. NOTE: This sheet is a summary. It may not cover all possible information. If you have questions about this medicine, talk to your doctor, pharmacist, or health care provider.  2014, Elsevier/Gold Standard. (2012-07-03 16:41:35)   We will call in about one week with biopsy results.

## 2014-03-03 NOTE — Progress Notes (Signed)
Subjective  Patient is here for sonohysterogram and endometrial biopsy for painful and heavy menses. Has known fibroids and history of endometriosis.  Has migraines and with vision change twice.  Increased if does not eat.   Objective  See ultrasound below - uterus with 3 fibroids - 2.6, 2.6, 1.9 cm - intramural.  EMS 8.63 mm.  Normal ovaries.  Small fluid around the right adnexa.       Procedure - sonohysterogram Consent performed. Speculum placed in vagina. Sterile prep of cervix with betadine. Cannula placed inside endometrial cavity without difficulty. Speculum removed. Sterile saline injected.    No          filling defect noted. Cannula removed. No complication.   Procedure - endometrial biopsy Consent performed. Speculum place in vagina.  Sterile prep of cervix with betadine.  Pipelle placed to    7      cm without difficulty twice. Tissue obtained and sent to pathology. Speculum removed.  No complications.  Assessment  Menorrhagia. Dysmenorrhea. Fibroids. History of endometriosis. History of migraines - possibly with aura.   Plan  Will check EMB results. Discussion with patient regarding treatment options provided normal biopsy result. Declines Mirena and hysterectomy. Prefers OrthoMicronor at this time. If progesterone only OCP not a good fit, will consider endometrial ablation.   20 minutes face to face time of which over 50% was spent in counseling.   After visit summary to patient.

## 2014-03-07 LAB — IPS OTHER TISSUE BIOPSY

## 2014-03-09 ENCOUNTER — Telehealth: Payer: Self-pay | Admitting: Emergency Medicine

## 2014-03-09 NOTE — Telephone Encounter (Signed)
Message left to return call to [place] at [phone].   Will need results and rx.

## 2014-03-09 NOTE — Telephone Encounter (Signed)
Message copied by Michele Mcalpine on Wed Mar 09, 2014  8:47 AM ------      Message from: [place], [place]: Mon Mar 07, 2014  5:18 PM       Olivia Mackie,            Please inform patient of endometrial biopsy results which are normal.      Patient had indicated an interest in doing a progesterone only OCP.            If she would still like to do this, I will prescribe Ortho Micronor for a three month trial, and the patient can come in for a recheck to monitor the response.             She would need to start the Ortho Micro with her next menses.             CC- Marisa Sprinkles ------

## 2014-03-10 NOTE — Telephone Encounter (Signed)
Spoke with patient. Results and message given from [place] as seen below. Patient is agreeable and verbalizes understanding. Patient states that she would like to do some more research and consider all options before beginning progesterone only OCP. Patient will call back if she decides as would like to begin OCP.  Routing to provider for final review. Patient agreeable to disposition. Will close encounter

## 2014-03-11 ENCOUNTER — Ambulatory Visit (INDEPENDENT_AMBULATORY_CARE_PROVIDER_SITE_OTHER): Payer: 59 | Admitting: *Deleted

## 2014-03-11 DIAGNOSIS — M722 Plantar fascial fibromatosis: Secondary | ICD-10-CM

## 2014-03-11 NOTE — Patient Instructions (Signed)

## 2014-03-11 NOTE — Progress Notes (Signed)
   Subjective:    Patient ID: Shelly [name], female    DOB: [birth year], 53 y.o.   MRN: [id number]  HPI PICK UP ORTHOTICS AND GIVEN INSTRUCTION.   Review of Systems     Objective:   Physical Exam        Assessment & Plan:

## 2014-03-22 ENCOUNTER — Other Ambulatory Visit: Payer: Self-pay | Admitting: Internal Medicine

## 2014-03-22 NOTE — Telephone Encounter (Signed)
Refill request

## 2014-03-29 ENCOUNTER — Other Ambulatory Visit: Payer: Self-pay | Admitting: Internal Medicine

## 2014-03-30 ENCOUNTER — Telehealth: Payer: Self-pay | Admitting: Obstetrics and Gynecology

## 2014-03-30 NOTE — Telephone Encounter (Signed)
Message from: [place], [place]: Mon Mar 07, 2014 5:18 PM  Olivia Mackie,  Please inform patient of endometrial biopsy results which are normal.  Patient had indicated an interest in doing a progesterone only OCP.  If she would still like to do this, I will prescribe Ortho Micronor for a three month trial, and the patient can come in for a recheck to monitor the response.  She would need to start the Ortho Micro with her next menses.

## 2014-03-30 NOTE — Telephone Encounter (Signed)
Message left to return call to [place] at [phone].   Can order ortho micronor per Dr. Elza Rafter order. Need to know what pharmacy to send to and give instructions.

## 2014-03-30 NOTE — Telephone Encounter (Signed)
Patient is calling to let Dr Quincy Simmonds know what she has decided for treatment. She wants to do the progesterone bc pills.

## 2014-03-31 ENCOUNTER — Other Ambulatory Visit: Payer: Self-pay | Admitting: Internal Medicine

## 2014-04-01 MED ORDER — NORETHINDRONE 0.35 MG PO TABS: 1.0000 | ORAL_TABLET | Freq: Every day | ORAL | Status: DC

## 2014-04-01 NOTE — Telephone Encounter (Signed)
Spoke with patient and message from Dr. Quincy Simmonds given.  Verbalized understanding and agreeable to plan.  Will folllow up with Dr. Quincy Simmonds.   Routed rx to [facility].   Routing to provider for final review. Patient agreeable to disposition. Will close encounter

## 2014-04-01 NOTE — Telephone Encounter (Signed)
Refill request

## 2014-05-02 ENCOUNTER — Other Ambulatory Visit: Payer: Self-pay | Admitting: Internal Medicine

## 2014-05-13 ENCOUNTER — Telehealth: Payer: Self-pay | Admitting: Obstetrics and Gynecology

## 2014-05-13 NOTE — Telephone Encounter (Signed)
Spoke with patient and gave recommendations per Dr. Sabra Heck, per Dr. Elza Rafter note.  Patient states she will stop OCP's and made appt to see Dr. Quincy Simmonds to discuss endometrial ablation and further recommendations on 07-01-14.  Offered earlier appt but patient going to be out of town for 2 weeks and this was next available.

## 2014-05-13 NOTE — Telephone Encounter (Signed)
Pt is having some problems with burning and cramping in her legs after taking progesterone only bc pills. Would like to speak with nurse.

## 2014-05-13 NOTE — Telephone Encounter (Signed)
Patient is on second pack of progesterone only pills. She states she did well with first pack. Now experiencing burning in both legs. States burning pain worse in R upper thigh, especially in areas of varicose veins. No redness or swelling of legs, no sob. Patient with hx of migraines. Hx of fibroids and heavy painful periods. Patient feels she is infertile and not using ocp for birth control, but to help with pain of periods.  Advised patient to stop with ocp today and not restart and to see if pain in legs resolves with dc of progesterone only birth control. Not to restart without doctor orders. Patient states she is aware of other options as suggested by Dr. Quincy Simmonds and will follow up to discuss.  Advised would route message to Dr. Sabra Heck and call her back with any further instructions.

## 2014-05-13 NOTE — Telephone Encounter (Signed)
Returning a call to Amanda.

## 2014-05-13 NOTE — Telephone Encounter (Signed)
Dr. Elza Rafter note said she would recommend an endometrial ablation if the OCPs didn't work or she had unacceptable side effects.  She probably needs an OV again.

## 2014-06-21 ENCOUNTER — Telehealth: Payer: Self-pay | Admitting: Obstetrics and Gynecology

## 2014-06-21 NOTE — Telephone Encounter (Signed)
Need to rs pts appt can move to Monday 06/27/14 in am see Tiffany

## 2014-07-01 ENCOUNTER — Institutional Professional Consult (permissible substitution): Payer: 59 | Admitting: Obstetrics and Gynecology

## 2014-08-29 ENCOUNTER — Encounter: Payer: Self-pay | Admitting: Obstetrics and Gynecology

## 2014-10-13 ENCOUNTER — Other Ambulatory Visit: Payer: Self-pay | Admitting: *Deleted

## 2014-10-13 NOTE — Telephone Encounter (Signed)
Patient requesting 90 day supply- eh

## 2014-10-14 MED ORDER — PANTOPRAZOLE SODIUM 40 MG PO TBEC: 40.0000 mg | DELAYED_RELEASE_TABLET | Freq: Every day | ORAL | Status: DC

## 2014-11-07 ENCOUNTER — Other Ambulatory Visit: Payer: Self-pay | Admitting: Internal Medicine

## 2014-11-07 NOTE — Telephone Encounter (Signed)
Refill request

## 2014-11-23 ENCOUNTER — Encounter: Payer: Self-pay | Admitting: Internal Medicine

## 2014-12-21 ENCOUNTER — Encounter: Payer: Self-pay | Admitting: *Deleted

## 2015-01-10 ENCOUNTER — Encounter: Payer: Self-pay | Admitting: *Deleted

## 2015-01-10 ENCOUNTER — Ambulatory Visit (INDEPENDENT_AMBULATORY_CARE_PROVIDER_SITE_OTHER): Payer: 59 | Admitting: Internal Medicine

## 2015-01-10 ENCOUNTER — Encounter: Payer: Self-pay | Admitting: Internal Medicine

## 2015-01-10 VITALS — BP 128/80 | HR 63 | Resp 16 | Ht 65.5 in | Wt 151.0 lb

## 2015-01-10 DIAGNOSIS — N938 Other specified abnormal uterine and vaginal bleeding: Secondary | ICD-10-CM

## 2015-01-10 DIAGNOSIS — Z8249 Family history of ischemic heart disease and other diseases of the circulatory system: Secondary | ICD-10-CM

## 2015-01-10 DIAGNOSIS — Z0000 Encounter for general adult medical examination without abnormal findings: Secondary | ICD-10-CM

## 2015-01-10 DIAGNOSIS — Z1211 Encounter for screening for malignant neoplasm of colon: Secondary | ICD-10-CM

## 2015-01-10 DIAGNOSIS — E038 Other specified hypothyroidism: Secondary | ICD-10-CM

## 2015-01-10 DIAGNOSIS — Z1151 Encounter for screening for human papillomavirus (HPV): Secondary | ICD-10-CM

## 2015-01-10 DIAGNOSIS — Z124 Encounter for screening for malignant neoplasm of cervix: Secondary | ICD-10-CM

## 2015-01-10 DIAGNOSIS — K219 Gastro-esophageal reflux disease without esophagitis: Secondary | ICD-10-CM

## 2015-01-10 LAB — CBC WITH DIFFERENTIAL/PLATELET
Basophils Absolute: 0 10*3/uL (ref 0.0–0.1)
Basophils Relative: 0 % (ref 0–1)
EOS PCT: 1 % (ref 0–5)
Eosinophils Absolute: 0 10*3/uL (ref 0.0–0.7)
HCT: 42.3 % (ref 36.0–46.0)
Hemoglobin: 14 g/dL (ref 12.0–15.0)
LYMPHS ABS: 1.1 10*3/uL (ref 0.7–4.0)
LYMPHS PCT: 30 % (ref 12–46)
MCH: 31.5 pg (ref 26.0–34.0)
MCHC: 33.1 g/dL (ref 30.0–36.0)
MCV: 95.1 fL (ref 78.0–100.0)
MONO ABS: 0.3 10*3/uL (ref 0.1–1.0)
MPV: 11.1 fL (ref 8.6–12.4)
Monocytes Relative: 9 % (ref 3–12)
NEUTROS ABS: 2.1 10*3/uL (ref 1.7–7.7)
Neutrophils Relative %: 60 % (ref 43–77)
PLATELETS: 251 10*3/uL (ref 150–400)
RBC: 4.45 MIL/uL (ref 3.87–5.11)
RDW: 12.6 % (ref 11.5–15.5)
WBC: 3.5 10*3/uL — ABNORMAL LOW (ref 4.0–10.5)

## 2015-01-10 LAB — COMPLETE METABOLIC PANEL WITH GFR
ALBUMIN: 4.1 g/dL (ref 3.5–5.2)
ALT: 19 U/L (ref 0–35)
AST: 16 U/L (ref 0–37)
Alkaline Phosphatase: 96 U/L (ref 39–117)
BUN: 15 mg/dL (ref 6–23)
CALCIUM: 9.1 mg/dL (ref 8.4–10.5)
CHLORIDE: 107 meq/L (ref 96–112)
CO2: 26 mEq/L (ref 19–32)
CREATININE: 0.59 mg/dL (ref 0.50–1.10)
GFR, Est African American: >89 mL/min
GLUCOSE: 92 mg/dL (ref 70–99)
POTASSIUM: 4.5 meq/L (ref 3.5–5.3)
Sodium: 140 mEq/L (ref 135–145)
Total Bilirubin: 0.5 mg/dL (ref 0.2–1.2)
Total Protein: 6.9 g/dL (ref 6.0–8.3)

## 2015-01-10 LAB — POCT URINALYSIS DIPSTICK
Bilirubin, UA: NEGATIVE
Glucose, UA: NEGATIVE
KETONES UA: NEGATIVE
Leukocytes, UA: NEGATIVE
Nitrite, UA: NEGATIVE
PH UA: 6.5
PROTEIN UA: NEGATIVE
RBC UA: NEGATIVE
SPEC GRAV UA: 1.01
UROBILINOGEN UA: NEGATIVE

## 2015-01-10 LAB — HEMOCCULT GUIAC POC 1CARD (OFFICE): FECAL OCCULT BLD: NEGATIVE

## 2015-01-10 LAB — LIPID PANEL
CHOLESTEROL: 174 mg/dL (ref 0–200)
HDL: 60 mg/dL (ref >=46)
LDL Cholesterol: 104 mg/dL — ABNORMAL HIGH (ref 0–99)
Total CHOL/HDL Ratio: 2.9 Ratio
Triglycerides: 50 mg/dL (ref <150)
VLDL: 10 mg/dL (ref 0–40)

## 2015-01-10 LAB — T4, FREE: Free T4: 1.55 ng/dL (ref 0.80–1.80)

## 2015-01-10 LAB — TSH: TSH: 1.077 u[IU]/mL (ref 0.350–4.500)

## 2015-01-10 LAB — T3, FREE: T3 FREE: 3 pg/mL (ref 2.3–4.2)

## 2015-01-10 LAB — VITAMIN B12: Vitamin B-12: 331 pg/mL (ref 211–911)

## 2015-01-10 NOTE — Progress Notes (Signed)
Subjective:    Patient ID: Shelly [name], female    DOB: 04-Dec-1960, 54 y.o.   MRN: [id number]  HPI  02/2014 GYN note Assessment  Menorrhagia. Dysmenorrhea. Fibroids. History of endometriosis. History of migraines - possibly with aura.   Plan  Will check EMB results. Discussion with patient regarding treatment options provided normal biopsy result. Declines Mirena and hysterectomy. Prefers OrthoMicronor at this time. If progesterone only OCP not a good fit, will consider endometrial ablation.   20 minutes face to face time of which over 50% was spent in counseling.   After visit summary to patient.           R            >                         TODAY  Shelly [name] is here for CPE  HM pap today, repeat colonoscopy due 2020,  Mm is due pt will schedule  She is happy as she has lost 20 lbs with the 56 day diet.  Pt reports 3 relatives have been found to have brain anuerysm pt has been told she has a congenital predisposition and she would like to discuss with a neurologist . Pt is asymptomatic   GERD she is on Protonix but would like to try to come off of this    Problem list reviewed   Allergies  Allergen Reactions  . Compazine [Prochlorperazine Edisylate] Other (See Comments)    Muscle contractions   Past Medical History  Diagnosis Date  . Depression   . Migraine   . GERD (gastroesophageal reflux disease)   . Hypothyroid   . Chronic kidney disease     kidney stones  . Endometriosis   . Fibroid   . Heart murmur    Past Surgical History  Procedure Laterality Date  . Laparoscopy  1994    endometriosis  . Pelvic laparoscopy  1993    d/t endometriosis--Dr. Matthew Saras   History   Social History  . Marital Status: Married    Spouse Name: N/A  . Number of Children: 2  . Years of Education: N/A   Occupational History  . RN [employer]   Social History Main Topics  . Smoking status: Never Smoker   . Smokeless tobacco: Never Used   . Alcohol Use: No  . Drug Use: No  . Sexual Activity:    Partners: Male    Birth Control/ Protection: None   Other Topics Concern  . Not on file   Social History Narrative   Family History  Problem Relation Age of Onset  . Hypertension Mother   . Hypertension Father   . Stroke Father   . Diabetes Father   . Hypertension Brother   . Hypertension Maternal Grandmother   . Hypertension Maternal Grandfather   . Kidney failure Maternal Grandfather   . Aneurysm Maternal Grandfather   . Cancer Maternal Aunt 42    breast  . Cancer Cousin     breast   Patient Active Problem List   Diagnosis Date Noted  . DUB (dysfunctional uterine bleeding) 01/05/2014  . Family history of abdominal aortic aneurysm 08/12/2012  . Renal calculus, left 06/04/2012  . Irritability 05/01/2012  . GERD (gastroesophageal reflux disease) 05/01/2012  . History of migraine 05/01/2012  . Hypothyroidism 05/01/2012  . History of renal calculi 05/01/2012  . Leukopenia 05/01/2012  . 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  Allergic rhinitis due to allergen 05/01/2012   Current Outpatient Prescriptions on File Prior to Visit  Medication Sig Dispense Refill  . amitriptyline (ELAVIL) 10 MG tablet Take 25 mg by mouth at bedtime. Takes 25 mg    . b complex vitamins tablet Take 1 tablet by mouth daily.    . calcium-vitamin D 250-100 MG-UNIT per tablet Take 1 tablet by mouth 2 (two) times daily.    . cholecalciferol (VITAMIN D) 1000 UNITS tablet Take 2,000 Units by mouth daily.    . fluticasone (FLONASE) 50 MCG/ACT nasal spray PLACE 1 SPRAY INTO BOTH NOSTRILS DAILY AS NEEDED FOR FOUR WEEKS THEN STOP 16 g 0  . Ginger, Zingiber officinalis, (GINGER PO) Take 1 tablet by mouth daily.    Marland Kitchen levothyroxine (SYNTHROID, LEVOTHROID) 75 MCG tablet TAKE 1 TABLET BY MOUTH ONCE DAILY 90 tablet 0  . nabumetone (RELAFEN) 500 MG tablet Take 500 mg by mouth 2 (two) times daily as needed.    . NON FORMULARY Take 1 tablet by mouth  daily. Tumeric    . norethindrone (ORTHO MICRONOR) 0.35 MG tablet Take 1 tablet (0.35 mg total) by mouth daily. 1 Package 2  . pantoprazole (PROTONIX) 40 MG tablet Take 1 tablet (40 mg total) by mouth daily. 90 tablet 1   No current facility-administered medications on file prior to visit.     Review of Systems  Respiratory: Negative for cough, chest tightness, shortness of breath and wheezing.   Cardiovascular: Negative for chest pain, palpitations and leg swelling.  All other systems reviewed and are negative.      Objective:   Physical Exam Physical Exam  Nursing note and vitals reviewed.  Constitutional: She is oriented to person, place, and time. She appears well-developed and well-nourished.  HENT:  Head: Normocephalic and atraumatic.  Right Ear: Tympanic membrane and ear canal normal. No drainage. Tympanic membrane is not injected and not erythematous.  Left Ear: Tympanic membrane and ear canal normal. No drainage. Tympanic membrane is not injected and not erythematous.  Nose: Nose normal. Right sinus exhibits no maxillary sinus tenderness and no frontal sinus tenderness. Left sinus exhibits no maxillary sinus tenderness and no frontal sinus tenderness.  Mouth/Throat: Oropharynx is clear and moist. No oral lesions. No oropharyngeal exudate.  Eyes: Conjunctivae and EOM are normal. Pupils are equal, round, and reactive to light.  Neck: Normal range of motion. Neck supple. No JVD present. Carotid bruit is not present. No mass and no thyromegaly present.  Cardiovascular: Normal rate, regular rhythm, S1 normal, S2 normal and intact distal pulses. Exam reveals no gallop and no friction rub.  No murmur heard.  Pulses:  Carotid pulses are 2+ on the right side, and 2+ on the left side.  Dorsalis pedis pulses are 2+ on the right side, and 2+ on the left side.  No carotid bruit. No LE edema  Pulmonary/Chest: Breath sounds normal. She has no wheezes. She has no rales. She exhibits no  tenderness.  Breast no discrete mass no nipple discharge no axillary adenopathy bilaterally Abdominal: Soft. Bowel sounds are normal. She exhibits no distension and no mass. There is no hepatosplenomegaly. There is no tenderness. There is no CVA tenderness.  Musculoskeletal: Normal range of motion.  No active synovitis to joints.  Lymphadenopathy:  She has no cervical adenopathy.  She has no axillary adenopathy.  Right: No inguinal and no supraclavicular adenopathy present.  Left: No inguinal and no supraclavicular adenopathy present.  Neurological: She is alert and oriented to person,  place, and time. She has normal strength and normal reflexes. She displays no tremor. No cranial nerve deficit or sensory deficit. Coordination and gait normal.  Skin: Skin is warm and dry. No rash noted. No cyanosis. Nails show no clubbing.  Psychiatric: She has a normal mood and affect. Her speech is normal and behavior is normal. Cognition and memory are normal.           Assessment & Plan:  HM  Pap today pt is a non-smoker  Will schedule 3d mm  Colonoscopy due 2020  GERD  Advised to continue Protonix  OK to try probiotic of choice   Renal calculi  Increase fluids to 1.5 to 2 liters daily  Intentional wt loss  Keep up the good work   DUB managed by gynecology.  Pt off OC's now and no further bleeding issues  Strong Fh of brain aneurysm pt would like ot speak to neurologist about MRA imaging  .  She is asymptomatic from a neurologic standpoint

## 2015-01-11 LAB — VITAMIN D 25 HYDROXY (VIT D DEFICIENCY, FRACTURES): VIT D 25 HYDROXY: 24 ng/mL — AB (ref 30–100)

## 2015-01-11 LAB — CYTOLOGY - PAP

## 2015-01-12 ENCOUNTER — Other Ambulatory Visit: Payer: Self-pay | Admitting: Internal Medicine

## 2015-01-12 ENCOUNTER — Encounter: Payer: Self-pay | Admitting: Internal Medicine

## 2015-01-12 ENCOUNTER — Encounter: Payer: Self-pay | Admitting: Neurology

## 2015-01-12 ENCOUNTER — Ambulatory Visit (INDEPENDENT_AMBULATORY_CARE_PROVIDER_SITE_OTHER): Payer: 59 | Admitting: Neurology

## 2015-01-12 VITALS — BP 130/74 | HR 76 | Temp 98.0°F | Resp 20 | Ht 66.0 in | Wt 149.5 lb

## 2015-01-12 DIAGNOSIS — G43009 Migraine without aura, not intractable, without status migrainosus: Secondary | ICD-10-CM

## 2015-01-12 DIAGNOSIS — Z8249 Family history of ischemic heart disease and other diseases of the circulatory system: Secondary | ICD-10-CM

## 2015-01-12 NOTE — Patient Instructions (Addendum)
Usually we screen people for aneurysms if they have at least one (usually two) first-degree relatives with history of cerebral aneurysm.  Since you have several non-first degree relatives with aneurysms, I think it would be appropriate to check.  We will get MRA of the head.  I will contact you with results. [facility]  01/27/15 1:45PM

## 2015-01-12 NOTE — Progress Notes (Signed)
NEUROLOGY CONSULTATION NOTE  Shelly [name] MRN: [id number] DOB: Nov 13, 1960  Referring provider: Dr. Coralyn Mark Primary care provider: Dr. Coralyn Mark  Reason for consult:  Indication to screen for cerebral aneurysm.  HISTORY OF PRESENT ILLNESS: Shelly [name] is a 54 year old right-handed woman with depression, migraine, GERD, and hypothyroidism who presents for evaluation and need to screen for cerebral aneurysms.  Records reviewed.  She has a strong family history of both cerebral and aortic aneurysms on her mother's side.  Her aunt was diagnosed with a cerebral aneurysm last summer after experiencing a headache.  She also had two second cousins who had ruptured cerebral aneurysms, one in her 27s and another in his 72s.  Family history of aortic aneurysms include her grandfather, sister and uncle.    She does have a history of migraines since she was young.  It is bilateral and usually located around the eyes and back of the head and neck.  It is usually a pounding or throbbing quality. The headache is not too debilitating, but the associated nausea and vomiting is problematic.  She usually gets them twice a year.  Sometimes she wakes up with one.  She reports that she had an MRI of the brain two years ago due to what was diagnosed as cervicogenic headaches.  She was also found to have cubital tunnel syndrome affecting her hands.  PAST MEDICAL HISTORY: Past Medical History  Diagnosis Date  . Depression   . Migraine   . GERD (gastroesophageal reflux disease)   . Hypothyroid   . Chronic kidney disease     kidney stones  . Endometriosis   . Fibroid   . Heart murmur     PAST SURGICAL HISTORY: Past Surgical History  Procedure Laterality Date  . Laparoscopy  1994    endometriosis  . Pelvic laparoscopy  1993    d/t endometriosis--Dr. Matthew Saras    MEDICATIONS: Current Outpatient Prescriptions on File Prior to Visit  Medication Sig Dispense Refill  . amitriptyline (ELAVIL) 10 MG  tablet Take 25 mg by mouth at bedtime. Takes 25 mg    . calcium-vitamin D 250-100 MG-UNIT per tablet Take 1 tablet by mouth 2 (two) times daily.    . fluticasone (FLONASE) 50 MCG/ACT nasal spray PLACE 1 SPRAY INTO BOTH NOSTRILS DAILY AS NEEDED FOR FOUR WEEKS THEN STOP 16 g 0  . Ginger, Zingiber officinalis, (GINGER PO) Take 1 tablet by mouth daily.    Marland Kitchen levothyroxine (SYNTHROID, LEVOTHROID) 75 MCG tablet TAKE 1 TABLET BY MOUTH ONCE DAILY 90 tablet 0  . pantoprazole (PROTONIX) 40 MG tablet Take 1 tablet (40 mg total) by mouth daily. 90 tablet 1   No current facility-administered medications on file prior to visit.    ALLERGIES: Allergies  Allergen Reactions  . Compazine [Prochlorperazine Edisylate] Other (See Comments)    Muscle contractions    FAMILY HISTORY: Family History  Problem Relation Age of Onset  . Hypertension Mother   . Hypertension Father   . Stroke Father   . Diabetes Father   . Heart disease Father   . Hypertension Brother   . Hypertension Maternal Grandmother   . Hypertension Maternal Grandfather   . Kidney failure Maternal Grandfather   . Aneurysm Maternal Grandfather   . Cancer Maternal Aunt 42    breast  . Aneurysm Maternal Aunt   . Cancer Cousin     breast    SOCIAL HISTORY: History   Social History  . Marital Status: Married  Spouse Name: N/A  . Number of Children: 2  . 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 Smoking status: Never Smoker   . Smokeless tobacco: Never Used  . Alcohol Use: No  . Drug Use: No  . Sexual Activity:    Partners: Male    Birth Control/ Protection: None   Other Topics Concern  . Not on file   Social History Narrative    REVIEW OF SYSTEMS: Constitutional: No fevers, chills, or sweats, no generalized fatigue, change in appetite Eyes: No visual changes, double vision, eye pain Ear, nose and throat: No hearing loss, ear pain, nasal congestion, sore  throat Cardiovascular: No chest pain, palpitations Respiratory:  No shortness of breath at rest or with exertion, wheezes GastrointestinaI: No nausea, vomiting, diarrhea, abdominal pain, fecal incontinence Genitourinary:  No dysuria, urinary retention or frequency Musculoskeletal:  No neck pain, back pain Integumentary: No rash, pruritus, skin lesions Neurological: as above Psychiatric: No depression, insomnia, anxiety Endocrine: No palpitations, fatigue, diaphoresis, mood swings, change in appetite, change in weight, increased thirst Hematologic/Lymphatic:  No anemia, purpura, petechiae. Allergic/Immunologic: no itchy/runny eyes, nasal congestion, recent allergic reactions, rashes  PHYSICAL EXAM: Filed Vitals:   01/12/15 1034  BP: 130/74  Pulse: 76  Temp: 98 F (36.7 C)  Resp: 20   General: No acute distress Head:  Normocephalic/atraumatic Eyes:  fundi unremarkable, without vessel changes, exudates, hemorrhages or papilledema. Neck: supple, no paraspinal tenderness, full range of motion Back: No paraspinal tenderness Heart: regular rate and rhythm Lungs: Clear to auscultation bilaterally. Vascular: No carotid bruits. Neurological Exam: Mental status: alert and oriented to person, place, and time, recent and remote memory intact, fund of knowledge intact, attention and concentration intact, speech fluent and not dysarthric, language intact. Cranial nerves: CN I: not tested CN II: pupils equal, round and reactive to light, visual fields intact, fundi unremarkable, without vessel changes, exudates, hemorrhages or papilledema. CN III, IV, VI:  full range of motion, no nystagmus, no ptosis CN V: facial sensation intact CN VII: upper and lower face symmetric CN VIII: hearing intact CN IX, X: gag intact, uvula midline CN XI: sternocleidomastoid and trapezius muscles intact CN XII: tongue midline Bulk & Tone: normal, no fasciculations. Motor:  5/5 throughout Sensation:  Reduced  light touch sensation in the 4th and 5th digits of the left hand.  Pinprick and vibration intact. Deep Tendon Reflexes:  2+ throughout, toes downgoing Finger to nose testing:  No dysmetria Heel to shin:  No dysmetria Gait:  Normal station and stride.  Able to turn and walk in tandem. Romberg negative.  IMPRESSION: Family history of cerebral and aortic aneurysms.   Episodic migraine without aura, not intractable.  The need to screen an asymptomatic patient for cerebral aneurysms is usually based on first-degree relatives with history of cerebral aneurysms.  Family history of a cerebral aneurysm in one first-degree relative increases risk of aneurysm from 2.3% to 4%.  If two or more first-degree relatives have a history of a cerebral aneurysm, the risk increases to 8%.  The American Heart Association and American Stroke Association recommend screening patients with family history of two first-degree relatives with known intracranial aneurysms.  Patients with a single first-degree relative with a known aneurysm may undergo screening.  In Shelly [name]'s case, she has strong family history of cerebral (and aortic) aneurysms, however they do not include first degree relatives.  That being said, none of her  first degree relatives such as her mother or siblings have ever been screened, so it is not certain if they may have asymptomatic cerebral aneurysms.  Due to the number of family members with history of cerebral aneurysms (although not first-degree relatives), as well as her history of migraines,  I would favor checking an MRA of the head.  We addressed the risks of detection such as associated anxiety, need for subsequent testing, and how it may affect need in getting insurance.  I will contact her with results and further management, if needed.  45 minutes spent with patient, over 50% spent discussing the statistics and risks of detention.  Thank you for allowing me to take part in the care of this  patient.  Metta Clines, DO  CC:  Emi Belfast, MD

## 2015-01-12 NOTE — Telephone Encounter (Signed)
Refill request

## 2015-01-13 ENCOUNTER — Encounter: Payer: Self-pay | Admitting: Internal Medicine

## 2015-01-13 MED ORDER — LEVOTHYROXINE SODIUM 75 MCG PO TABS: 75.0000 ug | ORAL_TABLET | Freq: Every day | ORAL | Status: DC

## 2015-01-13 MED ORDER — FLUTICASONE PROPIONATE 50 MCG/ACT NA SUSP: NASAL | Status: DC

## 2015-01-27 ENCOUNTER — Ambulatory Visit (HOSPITAL_COMMUNITY): Admission: RE | Admit: 2015-01-27 | Payer: 59 | Source: Ambulatory Visit

## 2015-03-23 ENCOUNTER — Other Ambulatory Visit: Payer: Self-pay | Admitting: Internal Medicine

## 2015-03-29 ENCOUNTER — Other Ambulatory Visit: Payer: Self-pay | Admitting: Internal Medicine

## 2015-03-29 ENCOUNTER — Ambulatory Visit (HOSPITAL_COMMUNITY)
Admission: RE | Admit: 2015-03-29 | Discharge: 2015-03-29 | Disposition: A | Discharge status: Home / Self Care | Payer: 59 | Source: Ambulatory Visit | Attending: Internal Medicine | Admitting: Internal Medicine

## 2015-03-29 DIAGNOSIS — Z1231 Encounter for screening mammogram for malignant neoplasm of breast: Secondary | ICD-10-CM | POA: Insufficient documentation

## 2015-03-29 DIAGNOSIS — Z0000 Encounter for general adult medical examination without abnormal findings: Secondary | ICD-10-CM

## 2015-12-04 MED FILL — XIIDRA 5% EYE DROPS: 5 | 30 days supply | Qty: 60 | Fill #1

## 2015-12-20 DIAGNOSIS — M62838 Other muscle spasm: Secondary | ICD-10-CM | POA: Diagnosis not present

## 2015-12-20 DIAGNOSIS — M170 Bilateral primary osteoarthritis of knee: Secondary | ICD-10-CM | POA: Diagnosis not present

## 2015-12-20 DIAGNOSIS — M542 Cervicalgia: Secondary | ICD-10-CM | POA: Diagnosis not present

## 2015-12-20 DIAGNOSIS — M1120 Other chondrocalcinosis, unspecified site: Secondary | ICD-10-CM | POA: Diagnosis not present

## 2015-12-26 MED FILL — LEVOTHYROXINE 75 MCG TABLET: 75 | 90 days supply | Qty: 90 | Fill #0

## 2016-01-23 DIAGNOSIS — E559 Vitamin D deficiency, unspecified: Secondary | ICD-10-CM | POA: Diagnosis not present

## 2016-01-23 DIAGNOSIS — Z0000 Encounter for general adult medical examination without abnormal findings: Secondary | ICD-10-CM | POA: Diagnosis not present

## 2016-01-23 DIAGNOSIS — Z23 Encounter for immunization: Secondary | ICD-10-CM | POA: Diagnosis not present

## 2016-01-23 DIAGNOSIS — R202 Paresthesia of skin: Secondary | ICD-10-CM | POA: Diagnosis not present

## 2016-01-23 DIAGNOSIS — Z13220 Encounter for screening for lipoid disorders: Secondary | ICD-10-CM | POA: Diagnosis not present

## 2016-01-23 DIAGNOSIS — F329 Major depressive disorder, single episode, unspecified: Secondary | ICD-10-CM | POA: Diagnosis not present

## 2016-01-30 MED FILL — XIIDRA 5% EYE DROPS: 5 | 30 days supply | Qty: 60 | Fill #2

## 2016-02-01 MED FILL — BUPROPION HCL SR 200 MG TAB: 200 | 90 days supply | Qty: 90 | Fill #1

## 2016-02-01 MED FILL — PANTOPRAZOLE SOD DR 40 MG T: 40 | 90 days supply | Qty: 90 | Fill #1

## 2016-02-19 ENCOUNTER — Encounter: Payer: Self-pay | Admitting: Neurology

## 2016-02-19 ENCOUNTER — Ambulatory Visit (INDEPENDENT_AMBULATORY_CARE_PROVIDER_SITE_OTHER): Payer: 59 | Admitting: Neurology

## 2016-02-19 VITALS — BP 132/88 | HR 68 | Ht 66.0 in | Wt 153.0 lb

## 2016-02-19 DIAGNOSIS — M5412 Radiculopathy, cervical region: Secondary | ICD-10-CM

## 2016-02-19 DIAGNOSIS — M542 Cervicalgia: Secondary | ICD-10-CM

## 2016-02-19 NOTE — Progress Notes (Signed)
NEUROLOGY FOLLOW UP OFFICE NOTE  JACKOLYN ALMENDARIZ VC:[id number]  HISTORY OF PRESENT ILLNESS: Shelly [name] is a 55 year old whom I previously seen for migraines and family history of cerebral aneurysm, now presents for neck pain and spinal numbness.    For several years, she has suffered from neck pain.  She denies preceding event.   It is posterior on the left side and radiates into the left side of her occiput.  There is a discomfort involving the back of her head.  There is no pain radiating down the left arm, but she notes numbness in the 4th and 5th digits of her left hand.  She denies weakness.  She has received trigger point injections in her trapezius muscles, which have helped in the past.  Coughing exacerbates it..  She also reports sensation of numbness running horizontally under the left shoulder blade.  There is no associated pain.  In October, she woke up one morning with non-radiating pain across her lower back.  She denies preceding event.  She was prescribed baclofen.  At first, she couldn't walk but this improved after a couple of weeks.  She continues to have mild low back pain.  She never had the MRA of the head performed last year due to cost.  PAST MEDICAL HISTORY: Past Medical History  Diagnosis Date  . Depression   . Migraine   . GERD (gastroesophageal reflux disease)   . Hypothyroid   . Chronic kidney disease     kidney stones  . Endometriosis   . Fibroid   . Heart murmur     MEDICATIONS: Current Outpatient Prescriptions on File Prior to Visit  Medication Sig Dispense Refill  . calcium-vitamin D 250-100 MG-UNIT per tablet Take 1 tablet by mouth 2 (two) times daily.    . fluticasone (FLONASE) 50 MCG/ACT nasal spray PLACE 1 SPRAY INTO BOTH NOSTRILS DAILY AS NEEDED FOR FOUR WEEKS THEN STOP 16 g 0  . levothyroxine (SYNTHROID, LEVOTHROID) 75 MCG tablet Take 1 tablet (75 mcg total) by mouth daily. 90 tablet 1  . pantoprazole (PROTONIX) 40 MG tablet Take 1 tablet  (40 mg total) by mouth daily. 90 tablet 1  . amitriptyline (ELAVIL) 10 MG tablet Take 25 mg by mouth at bedtime. Reported on 02/19/2016    . Ginger, Zingiber officinalis, (GINGER PO) Take 1 tablet by mouth daily. Reported on 02/19/2016     No current facility-administered medications on file prior to visit.    ALLERGIES: Allergies  Allergen Reactions  . Compazine [Prochlorperazine Edisylate] Other (See Comments)    Muscle contractions    FAMILY HISTORY: Family History  Problem Relation Age of Onset  . Hypertension Mother   . Hypertension Father   . Stroke Father   . Diabetes Father   . Heart disease Father   . Hypertension Brother   . Hypertension Maternal Grandmother   . Hypertension Maternal Grandfather   . Kidney failure Maternal Grandfather   . Aneurysm Maternal Grandfather   . Cancer Maternal Aunt 42    breast  . Aneurysm Maternal Aunt   . Cancer Cousin     breast    SOCIAL HISTORY: Social History   Social History  . Marital Status: Married    Spouse Name: N/A  . Number of Children: 2  . 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 Smoking status: Never Smoker   . Smokeless tobacco: Never Used  .  Alcohol Use: No  . Drug Use: No  . Sexual Activity:    Partners: Male    Birth Control/ Protection: None   Other Topics Concern  . Not on file   Social History Narrative    REVIEW OF SYSTEMS: Constitutional: No fevers, chills, or sweats, no generalized fatigue, change in appetite Eyes: No visual changes, double vision, eye pain Ear, nose and throat: No hearing loss, ear pain, nasal congestion, sore throat Cardiovascular: No chest pain, palpitations Respiratory:  No shortness of breath at rest or with exertion, wheezes GastrointestinaI: No nausea, vomiting, diarrhea, abdominal pain, fecal incontinence Genitourinary:  No dysuria, urinary retention or frequency Musculoskeletal:  No neck pain, back  pain Integumentary: No rash, pruritus, skin lesions Neurological: as above Psychiatric: No depression, insomnia, anxiety Endocrine: No palpitations, fatigue, diaphoresis, mood swings, change in appetite, change in weight, increased thirst Hematologic/Lymphatic:  No anemia, purpura, petechiae. Allergic/Immunologic: no itchy/runny eyes, nasal congestion, recent allergic reactions, rashes  PHYSICAL EXAM: Filed Vitals:   02/19/16 0958  BP: 132/88  Pulse: 68   General: No acute distress.  Patient appears well-groomed.  normal body habitus. Head:  Normocephalic/atraumatic Eyes:  Fundi examined but not visualized Neck: supple, no paraspinal tenderness, full range of motion.  Left suboccipital tenderness. Heart:  Regular rate and rhythm Lungs:  Clear to auscultation bilaterally Back: No paraspinal tenderness Neurological Exam: alert and oriented to person, place, and time. Attention span and concentration intact, recent and remote memory intact, fund of knowledge intact.  Speech fluent and not dysarthric, language intact.  CN II-XII intact. Bulk and tone normal, muscle strength 5/5 throughout.  Sensation to light touch, temperature and vibration intact.  Deep tendon reflexes 2+ throughout, toes downgoing.  Finger to nose and heel to shin testing intact.  Gait normal, Romberg negative.  IMPRESSION: 1.  Cervicalgia, likely musculoskeletal.  2.  Also, possible cervical radiculopathy versus ulnar neuropathy involving the left upper extremity (she has history of ulnar neuropathy on right). 3.  Low back pain 4.  Family history of cerebral aneurysm.  Since she did not have any first degree relatives with aneurysm, screening is not warranted.  She denies any headaches/severe headaches.  PLAN: 1.  Refer to Dr. Hulan Saas for OMT. 2.  If OMT not effective, will get MRI of cervical spine 3.  Follow up   30 minutes spent face to face with patient, over 50% spent discussing management and  diagnosis.  Metta Clines, DO  CC:  Emi Belfast, MD

## 2016-02-19 NOTE — Progress Notes (Signed)
Chart forwarded.  

## 2016-02-19 NOTE — Patient Instructions (Signed)
The neck pain is likely a combination of arthritis and muscle spasm.  The numbness in the hand may suggest a pinched nerve as well, however it can easily be another ulnar neuropathy.  First, I will refer you to Dr. Hulan Saas, who performs osteopathic manipulative medicine.  He can address your low-back pain as well.  Follow up after a few sessions.  If not helpful, then we can pursue MRI of the cervical spine.

## 2016-03-01 ENCOUNTER — Encounter: Payer: Self-pay | Admitting: Family Medicine

## 2016-03-01 ENCOUNTER — Ambulatory Visit (INDEPENDENT_AMBULATORY_CARE_PROVIDER_SITE_OTHER): Payer: 59 | Admitting: Family Medicine

## 2016-03-01 ENCOUNTER — Ambulatory Visit (INDEPENDENT_AMBULATORY_CARE_PROVIDER_SITE_OTHER)
Admission: RE | Admit: 2016-03-01 | Discharge: 2016-03-01 | Disposition: A | Discharge status: Home / Self Care | Payer: 59 | Source: Ambulatory Visit | Attending: Family Medicine | Admitting: Family Medicine

## 2016-03-01 VITALS — BP 118/80 | HR 72 | Ht 65.5 in | Wt 152.0 lb

## 2016-03-01 DIAGNOSIS — M542 Cervicalgia: Secondary | ICD-10-CM

## 2016-03-01 DIAGNOSIS — M9902 Segmental and somatic dysfunction of thoracic region: Secondary | ICD-10-CM

## 2016-03-01 DIAGNOSIS — M47812 Spondylosis without myelopathy or radiculopathy, cervical region: Secondary | ICD-10-CM | POA: Diagnosis not present

## 2016-03-01 DIAGNOSIS — G2589 Other specified extrapyramidal and movement disorders: Secondary | ICD-10-CM | POA: Diagnosis not present

## 2016-03-01 DIAGNOSIS — G8929 Other chronic pain: Secondary | ICD-10-CM

## 2016-03-01 DIAGNOSIS — M9903 Segmental and somatic dysfunction of lumbar region: Secondary | ICD-10-CM

## 2016-03-01 DIAGNOSIS — M9901 Segmental and somatic dysfunction of cervical region: Secondary | ICD-10-CM | POA: Diagnosis not present

## 2016-03-01 DIAGNOSIS — M999 Biomechanical lesion, unspecified: Secondary | ICD-10-CM | POA: Insufficient documentation

## 2016-03-01 MED ORDER — DICLOFENAC SODIUM 2 % TD SOLN: 2.0000 "application " | Freq: Two times a day (BID) | TRANSDERMAL | Status: DC

## 2016-03-01 MED ORDER — VITAMIN D (ERGOCALCIFEROL) 1.25 MG (50000 UNIT) PO CAPS: 50000.0000 [IU] | ORAL_CAPSULE | ORAL | Status: DC

## 2016-03-01 MED FILL — VIT D2 1.25 MG (50,000 UNIT: 1.25 MG | 84 days supply | Qty: 12 | Fill #0

## 2016-03-01 NOTE — Progress Notes (Signed)
Pre visit review using our clinic review tool, if applicable. No additional management support is needed unless otherwise documented below in the visit note. 

## 2016-03-01 NOTE — Patient Instructions (Signed)
Good to see you  Ice 20 minutes 2 times daily. Usually after activity and before bed. Exercises 3 times a week.  On wall with heels, butt shoulder and head touching for a goal of 5 minutes daily  Once weekly vitamin D for next 12 weeks pennsaid pinkie amount topically 2 times daily as needed.  Turmeric 500mg  twice daily  Tart cherry extract any dose at night  See me again in 3 weeks.

## 2016-03-01 NOTE — Assessment & Plan Note (Signed)
Exercises given, Discussed ergonomics. Discussed the importance of posterior. Patient will try topical anti-inflammatories. Follow-up in 3-4 weeks.

## 2016-03-01 NOTE — Assessment & Plan Note (Signed)
Decision today to treat with OMT was based on Physical Exam  After verbal consent patient was treated with HVLA, ME, FPR techniques in Cervical, thoracic, and lumbar and sacral areas  Patient tolerated the procedure well with improvement in symptoms  Patient given exercises, stretches and lifestyle modifications  See medications in patient instructions if given  Patient will follow up in 3 weeks

## 2016-03-01 NOTE — Assessment & Plan Note (Signed)
Patient is in more of a chronic neck pain. We discussed with patient about topical anti-inflammatories which was prescribed today. We discussed ergonomics and patient work with Product/process development scientist. Patient did respond well to osteopathic manipulation. Patient and will come back and see me again in 3 weeks.

## 2016-03-01 NOTE — Progress Notes (Signed)
[name] [place], [place] [address] Phone: [phone] Subjective:   Referred by Dr. Tomi Likens.   CC: neck pain  [id number] Shelly [name] is a 55 y.o. female coming in with complaint of neck pain. Patient describes pain as a dull, throbbing aching sensation that is associated with some headaches. Patient has had a past medical history significant for some anxiety that likely is contributing to it as well. Patient tries to be more active but finds difficulty doing such. Pain hurts her more when she is doing repetitive activity or standing for long amount of time. Can radiate towards her shoulder blades as well. Feels like sometimes there is even spots of numbness on her skin. Mild radiation down the arm but very minimal. Sometimes seems to go towards her elbow.    creases severity of pain a 6 out of 10. Nothing that stops her from activity but he can make difficult tasks from time to time.       Past Medical History  Diagnosis Date  . Depression   . Migraine   . GERD (gastroesophageal reflux disease)   . Hypothyroid   . Chronic kidney disease     kidney stones  . Endometriosis   . Fibroid   . Heart murmur    Past Surgical History  Procedure Laterality Date  . Laparoscopy  1994    endometriosis  . Pelvic laparoscopy  1993    d/t endometriosis--Dr. Matthew Saras   Social History   Social History  . Marital Status: Married    Spouse Name: N/A  . Number of Children: 2  . Years of Education: N/A   Occupational History  . RN [employer]   Social History Main Topics  . Smoking status: Never Smoker   . Smokeless tobacco: Never Used  . Alcohol Use: No  . Drug Use: No  . Sexual Activity:    Partners: Male    Birth Control/ Protection: None   Other Topics Concern  . None   Social History Narrative   Allergies  Allergen Reactions  . Compazine [Prochlorperazine Edisylate] Other (See Comments)    Muscle contractions    Family History  Problem Relation Age of Onset  . Hypertension Mother   . Hypertension Father   . Stroke Father   . Diabetes Father   . Heart disease Father   . Hypertension Brother   . Hypertension Maternal Grandmother   . Hypertension Maternal Grandfather   . Kidney failure Maternal Grandfather   . Aneurysm Maternal Grandfather   . Cancer Maternal Aunt 42    breast  . Aneurysm Maternal Aunt   . Cancer Cousin     breast    Past medical history, social, surgical and family history all reviewed in electronic medical record.  No pertanent information unless stated regarding to the chief complaint.   Review of Systems: No headache, visual changes, nausea, vomiting, diarrhea, constipation, dizziness, abdominal pain, skin rash, fevers, chills, night sweats, weight loss, swollen lymph nodes, body aches, joint swelling, muscle aches, chest pain, shortness of breath, mood changes.   Objective Blood pressure 118/80, pulse 72, height 5' 5.5" (1.664 m), weight 152 lb (68.947 kg), last menstrual period 02/22/2014, SpO2 99 %.  General: No apparent distress alert and oriented x3 mood and affect normal, dressed appropriately.  HEENT: Pupils equal, extraocular movements intact  Respiratory: Patient's speak in full sentences and does not appear short of breath  Cardiovascular: No lower extremity edema, non  tender, no erythema  Skin: Warm dry intact with no signs of infection or rash on extremities or on axial skeleton.  Abdomen: Soft nontender  Neuro: Cranial nerves II through XII are intact, neurovascularly intact in all extremities with 2+ DTRs and 2+ pulses.  Lymph: No lymphadenopathy of posterior or anterior cervical chain or axillae bilaterally.  Gait normal with good balance and coordination.  MSK:  Non tender with full range of motion and good stability and symmetric strength and tone of  elbows, wrist, hip, knee and ankles bilaterally.  Neck: Inspection unremarkable. No palpable  stepoffs. Negative Spurling's maneuver. Limitation on the left side. Especially with side bending as well as rotation Grip strength and sensation normal in bilateral hands Strength good C4 to T1 distribution No sensory change to C4 to T1 Negative Hoffman sign bilaterally Reflexes normal Poor scapular motion noted  Osteopathic findings C2 flexed rotated and side bent left C4 flexed rotated and side bent right T2 extended rotated and side bent left T8 extended rotated and side bent right L2 flexed rotated and side bent right Sacrum left on left    Impression and Recommendations:     This case required medical decision making of moderate complexity.      Note: This dictation was prepared with Dragon dictation along with smaller phrase technology. Any transcriptional errors that result from this process are unintentional.

## 2016-03-20 MED FILL — LEVOTHYROXINE 75 MCG TABLET: 75 | 90 days supply | Qty: 90 | Fill #1

## 2016-03-22 ENCOUNTER — Encounter: Payer: Self-pay | Admitting: Family Medicine

## 2016-03-22 ENCOUNTER — Ambulatory Visit (INDEPENDENT_AMBULATORY_CARE_PROVIDER_SITE_OTHER): Payer: 59 | Admitting: Family Medicine

## 2016-03-22 VITALS — BP 120/80 | HR 69 | Ht 65.5 in | Wt 154.0 lb

## 2016-03-22 DIAGNOSIS — M9902 Segmental and somatic dysfunction of thoracic region: Secondary | ICD-10-CM

## 2016-03-22 DIAGNOSIS — M9903 Segmental and somatic dysfunction of lumbar region: Secondary | ICD-10-CM

## 2016-03-22 DIAGNOSIS — G2589 Other specified extrapyramidal and movement disorders: Secondary | ICD-10-CM

## 2016-03-22 DIAGNOSIS — M999 Biomechanical lesion, unspecified: Secondary | ICD-10-CM

## 2016-03-22 DIAGNOSIS — M9901 Segmental and somatic dysfunction of cervical region: Secondary | ICD-10-CM

## 2016-03-22 NOTE — Assessment & Plan Note (Signed)
Decision today to treat with OMT was based on Physical Exam  After verbal consent patient was treated with HVLA, ME, FPR techniques in Cervical, thoracic, and lumbar and sacral areas  Patient tolerated the procedure well with improvement in symptoms  Patient given exercises, stretches and lifestyle modifications  See medications in patient instructions if given  Patient will follow up in 6-8 weeks

## 2016-03-22 NOTE — Patient Instructions (Signed)
Good to see you  Ice is your friend Keep doing what you are doing Stay active Keep it up! See me again in 6-8 weeks!

## 2016-03-22 NOTE — Progress Notes (Signed)
Pre visit review using our clinic review tool, if applicable. No additional management support is needed unless otherwise documented below in the visit note. 

## 2016-03-22 NOTE — Progress Notes (Signed)
[name] [place], [place] [address] Phone: [phone] Subjective:     CC: neck pain f/u  [id number] Shelly [name] is a 55 y.o. female coming in with complaint of neck pain. Patient sent have more of a scapular dysfunction and poor posture. Has been working on her ergonomics, taking the over-the-counter medications, doing some icing. States that she is feeling 80% better. States that the headaches is significantly less as well. Happy with the results of far. No side effects.       Past Medical History  Diagnosis Date  . Depression   . Migraine   . GERD (gastroesophageal reflux disease)   . Hypothyroid   . Chronic kidney disease     kidney stones  . Endometriosis   . Fibroid   . Heart murmur    Past Surgical History  Procedure Laterality Date  . Laparoscopy  1994    endometriosis  . Pelvic laparoscopy  1993    d/t endometriosis--Dr. Matthew Saras   Social History   Social History  . Marital Status: Married    Spouse Name: N/A  . Number of Children: 2  . 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 Smoking status: Never Smoker   . Smokeless tobacco: Never Used  . Alcohol Use: No  . Drug Use: No  . Sexual Activity:    Partners: Male    Birth Control/ Protection: None   Other Topics Concern  . None   Social History Narrative   Allergies  Allergen Reactions  . Compazine [Prochlorperazine Edisylate] Other (See Comments)    Muscle contractions   Family History  Problem Relation Age of Onset  . Hypertension Mother   . Hypertension Father   . Stroke Father   . Diabetes Father   . Heart disease Father   . Hypertension Brother   . Hypertension Maternal Grandmother   . Hypertension Maternal Grandfather   . Kidney failure Maternal Grandfather   . Aneurysm Maternal Grandfather   . Cancer Maternal Aunt 42    breast  . Aneurysm Maternal Aunt   . Cancer  Cousin     breast    Past medical history, social, surgical and family history all reviewed in electronic medical record.  No pertanent information unless stated regarding to the chief complaint.   Review of Systems: No headache, visual changes, nausea, vomiting, diarrhea, constipation, dizziness, abdominal pain, skin rash, fevers, chills, night sweats, weight loss, swollen lymph nodes, body aches, joint swelling, muscle aches, chest pain, shortness of breath, mood changes.   Objective Blood pressure 120/80, pulse 69, height 5' 5.5" (1.664 m), weight 154 lb (69.854 kg), last menstrual period 02/22/2014, SpO2 95 %.  General: No apparent distress alert and oriented x3 mood and affect normal, dressed appropriately.  HEENT: Pupils equal, extraocular movements intact  Respiratory: Patient's speak in full sentences and does not appear short of breath  Cardiovascular: No lower extremity edema, non tender, no erythema  Skin: Warm dry intact with no signs of infection or rash on extremities or on axial skeleton.  Abdomen: Soft nontender  Neuro: Cranial nerves II through XII are intact, neurovascularly intact in all extremities with 2+ DTRs and 2+ pulses.  Lymph: No lymphadenopathy of posterior or anterior cervical chain or axillae bilaterally.  Gait normal with good balance and coordination.  MSK:  Non tender with full range of motion and good stability  and symmetric strength and tone of  elbows, wrist, hip, knee and ankles bilaterally.  Neck: Inspection unremarkable. No palpable stepoffs. Negative Spurling's maneuver. Near full range of motion today with very minimal decrease in left-sided rotation. Grip strength and sensation normal in bilateral hands Strength good C4 to T1 distribution No sensory change to C4 to T1 Negative Hoffman sign bilaterally Reflexes normal Continued mild decrease in scapular motion with inferior winging on the right side. Improved from previous exam  though.   Osteopathic findings C2 flexed rotated and side bent left C4 flexed rotated and side bent right T2 extended rotated and side bent left Inhaled second rib T7 extended rotated and side bent right L2 flexed rotated and side bent right Sacrum left on left    Impression and Recommendations:     This case required medical decision making of moderate complexity.      Note: This dictation was prepared with Dragon dictation along with smaller phrase technology. Any transcriptional errors that result from this process are unintentional.

## 2016-03-22 NOTE — Assessment & Plan Note (Signed)
Patient is done significantly better with the postural training. We discussed ergonomics throughout the day again. Discussed proper lifting mechanics. We'll continue same medications. Does have muscle relaxers for breakthrough. Continue with the once weekly vitamin D supple mentation. Follow-up with me again in 6-8 weeks.

## 2016-05-01 MED FILL — ORTHOVISC 15 MG/ML SYRINGE: 30 | 28 days supply | Qty: 8 | Fill #0

## 2016-05-14 ENCOUNTER — Ambulatory Visit (INDEPENDENT_AMBULATORY_CARE_PROVIDER_SITE_OTHER): Payer: 59 | Admitting: Family Medicine

## 2016-05-14 ENCOUNTER — Encounter: Payer: Self-pay | Admitting: Family Medicine

## 2016-05-14 VITALS — BP 122/80 | HR 72 | Wt 150.0 lb

## 2016-05-14 DIAGNOSIS — M542 Cervicalgia: Secondary | ICD-10-CM | POA: Diagnosis not present

## 2016-05-14 DIAGNOSIS — M9902 Segmental and somatic dysfunction of thoracic region: Secondary | ICD-10-CM

## 2016-05-14 DIAGNOSIS — M9901 Segmental and somatic dysfunction of cervical region: Secondary | ICD-10-CM | POA: Diagnosis not present

## 2016-05-14 DIAGNOSIS — M9903 Segmental and somatic dysfunction of lumbar region: Secondary | ICD-10-CM | POA: Diagnosis not present

## 2016-05-14 DIAGNOSIS — M999 Biomechanical lesion, unspecified: Secondary | ICD-10-CM

## 2016-05-14 DIAGNOSIS — G8929 Other chronic pain: Secondary | ICD-10-CM

## 2016-05-14 NOTE — Assessment & Plan Note (Signed)
Still believe the patient's chronic pain is secondary to some anxiety as well as a poor posture. Encourage patient to continue to work on the strengthening the scapula. Patient will continue on the ergonomics at work as well. Patient will be more motivated to try to do this on a regular basis. Patient and will follow-up and see me again in 6 weeks.

## 2016-05-14 NOTE — Assessment & Plan Note (Signed)
Decision today to treat with OMT was based on Physical Exam  After verbal consent patient was treated with HVLA, ME, FPR techniques in Cervical, thoracic, and lumbar and sacral areas  Patient tolerated the procedure well with improvement in symptoms  Patient given exercises, stretches and lifestyle modifications  See medications in patient instructions if given  Patient will follow up in 6-8 weeks

## 2016-05-14 NOTE — Patient Instructions (Signed)
Good to see you  Ice is your friend Get back on the horse.  The exercises, and the posture See me again in 6 weeks.

## 2016-05-14 NOTE — Progress Notes (Signed)
[name] [place], [place] [address] Phone: [phone] Subjective:     CC: neck pain f/u  [id number] Shelly [name] is a 55 y.o. female coming in with complaint of neck pain. Patient sent have more of a scapular dysfunction and poor posture. Patient has not been doing her exercises much. Not as much improvement from previous exam. Continues to vitamin D and the other vitamins. States that she has noticed some more tightness. Has been doing more yard work recently. Just worsening of previous symptoms.       Past Medical History  Diagnosis Date  . Depression   . Migraine   . GERD (gastroesophageal reflux disease)   . Hypothyroid   . Chronic kidney disease     kidney stones  . Endometriosis   . Fibroid   . Heart murmur    Past Surgical History  Procedure Laterality Date  . Laparoscopy  1994    endometriosis  . Pelvic laparoscopy  1993    d/t endometriosis--Dr. Matthew Saras   Social History   Social History  . Marital Status: Married    Spouse Name: N/A  . Number of Children: 2  . Years of Education: N/A   Occupational History  . RN [employer]   Social History Main Topics  . Smoking status: Never Smoker   . Smokeless tobacco: Never Used  . Alcohol Use: No  . Drug Use: No  . Sexual Activity:    Partners: Male    Birth Control/ Protection: None   Other Topics Concern  . None   Social History Narrative   Allergies  Allergen Reactions  . Compazine [Prochlorperazine Edisylate] Other (See Comments)    Muscle contractions   Family History  Problem Relation Age of Onset  . Hypertension Mother   . Hypertension Father   . Stroke Father   . Diabetes Father   . Heart disease Father   . Hypertension Brother   . Hypertension Maternal Grandmother   . Hypertension Maternal Grandfather   . Kidney failure Maternal Grandfather   . Aneurysm Maternal Grandfather   . Cancer Maternal Aunt 42    breast  . Aneurysm  Maternal Aunt   . Cancer Cousin     breast    Past medical history, social, surgical and family history all reviewed in electronic medical record.  No pertanent information unless stated regarding to the chief complaint.   Review of Systems: No headache, visual changes, nausea, vomiting, diarrhea, constipation, dizziness, abdominal pain, skin rash, fevers, chills, night sweats, weight loss, swollen lymph nodes, body aches, joint swelling, muscle aches, chest pain, shortness of breath, mood changes.   Objective Blood pressure 122/80, pulse 72, weight 150 lb (68.04 kg), last menstrual period 02/22/2014.  General: No apparent distress alert and oriented x3 mood and affect normal, dressed appropriately.  HEENT: Pupils equal, extraocular movements intact  Respiratory: Patient's speak in full sentences and does not appear short of breath  Cardiovascular: No lower extremity edema, non tender, no erythema  Skin: Warm dry intact with no signs of infection or rash on extremities or on axial skeleton.  Abdomen: Soft nontender  Neuro: Cranial nerves II through XII are intact, neurovascularly intact in all extremities with 2+ DTRs and 2+ pulses.  Lymph: No lymphadenopathy of posterior or anterior cervical chain or axillae bilaterally.  Gait normal with good balance and coordination.  MSK:  Non tender with full range of motion and good stability and symmetric strength  and tone of  elbows, wrist, hip, knee and ankles bilaterally.  Neck: Inspection unremarkable. No palpable stepoffs. Negative Spurling's maneuver. Still some very mild tenderness in the left-sided rotation Grip strength and sensation normal in bilateral hands Strength good C4 to T1 distribution No sensory change to C4 to T1 Negative Hoffman sign bilaterally Reflexes normal Continued mild decrease in scapular motion with inferior winging on the right side. Possibly mild worsening from previous exam.   Osteopathic findings C2 flexed  rotated and side bent left C5 flexed rotated and side bent right T5 extended rotated and side bent left Inhaled second rib T9 extended rotated and side bent right L2 flexed rotated and side bent right Sacrum left on left    Impression and Recommendations:     This case required medical decision making of moderate complexity.      Note: This dictation was prepared with Dragon dictation along with smaller phrase technology. Any transcriptional errors that result from this process are unintentional.

## 2016-05-15 MED FILL — BUPROPION HCL SR 200 MG TAB: 200 | 90 days supply | Qty: 90 | Fill #0

## 2016-06-25 NOTE — Progress Notes (Signed)
[name], [place] [address] Phone: [phone] Subjective:     CC: neck pain f/u  [id number]  Shelly [name] is a 55 y.o. female coming in with complaint of neck pain. Patient sent have more of a scapular dysfunction and poor posture. Has been doing relatively well for quite some time. Patient describes the pain is still a dull aching sensation that seems to be intermittent. Has responded fairly well to over-the-counter medications. Once weekly vitamin D. Muscle strength and endurance. Patient has been working on Personal assistant. Patient states She is doing significantly better. States that her neck is really not having any pain. Patient states some mild upper back pain but nothing severe. Continuing to work out more regular basis. States that feels the vitamin D has been helpful.       Past Medical History:  Diagnosis Date  . Chronic kidney disease    kidney stones  . Depression   . Endometriosis   . Fibroid   . GERD (gastroesophageal reflux disease)   . Heart murmur   . Hypothyroid   . Migraine    Past Surgical History:  Procedure Laterality Date  . LAPAROSCOPY  1994   endometriosis  . PELVIC LAPAROSCOPY  1993   d/t endometriosis--Dr. Matthew Saras   Social History   Social History  . Marital status: Married    Spouse name: N/A  . Number of children: 2  . Years of education: N/A   Occupational History  . RN [employer]   Social History Main Topics  . Smoking status: Never Smoker  . Smokeless tobacco: Never Used  . Alcohol use No  . Drug use: No  . Sexual activity: Yes    Partners: Male    Birth control/ protection: None   Other Topics Concern  . None   Social History Narrative  . None   Allergies  Allergen Reactions  . Compazine [Prochlorperazine Edisylate] Other (See Comments)    Muscle contractions   Family History  Problem Relation Age of Onset  . Hypertension Mother   . Hypertension Father   .  Stroke Father   . Diabetes Father   . Heart disease Father   . Hypertension Brother   . Hypertension Maternal Grandmother   . Hypertension Maternal Grandfather   . Kidney failure Maternal Grandfather   . Aneurysm Maternal Grandfather   . Cancer Maternal Aunt 42    breast  . Aneurysm Maternal Aunt   . Cancer Cousin     breast    Past medical history, social, surgical and family history all reviewed in electronic medical record.  No pertanent information unless stated regarding to the chief complaint.   Review of Systems: No headache, visual changes, nausea, vomiting, diarrhea, constipation, dizziness, abdominal pain, skin rash, fevers, chills, night sweats, weight loss, swollen lymph nodes, body aches, joint swelling, muscle aches, chest pain, shortness of breath, mood changes.   Objective  Blood pressure 130/82, pulse 77, weight 150 lb (68 kg), last menstrual period 02/22/2014, SpO2 99 %.  General: No apparent distress alert and oriented x3 mood and affect normal, dressed appropriately.  HEENT: Pupils equal, extraocular movements intact  Respiratory: Patient's speak in full sentences and does not appear short of breath  Cardiovascular: No lower extremity edema, non tender, no erythema  Skin: Warm dry intact with no signs of infection or rash on extremities or on axial skeleton.  Abdomen: Soft nontender  Neuro: Cranial nerves II through XII  are intact, neurovascularly intact in all extremities with 2+ DTRs and 2+ pulses.  Lymph: No lymphadenopathy of posterior or anterior cervical chain or axillae bilaterally.  Gait normal with good balance and coordination.  MSK:  Non tender with full range of motion and good stability and symmetric strength and tone of  elbows, wrist, hip, knee and ankles bilaterally.  Neck: Inspection unremarkable. No palpable stepoffs. Negative Spurling's maneuver. Near full range of motion Grip strength and sensation normal in bilateral hands Strength good C4  to T1 distribution No sensory change to C4 to T1 Negative Hoffman sign bilaterally Reflexes normal Continued mild scapular dyskinesia   Osteopathic findings C2 flexed rotated and side bent left C5 flexed rotated and side bent right T5 extended rotated and side bent left Inhaled second rib T7 extended rotated and side bent right L2 flexed rotated and side bent right Sacrum left on left    Impression and Recommendations:     This case required medical decision making of moderate complexity.      Note: This dictation was prepared with Dragon dictation along with smaller phrase technology. Any transcriptional errors that result from this process are unintentional.

## 2016-06-25 NOTE — Assessment & Plan Note (Signed)
Decision today to treat with OMT was based on Physical Exam  After verbal consent patient was treated with HVLA, ME, FPR techniques in Cervical, thoracic, and lumbar and sacral areas  Patient tolerated the procedure well with improvement in symptoms  Patient given exercises, stretches and lifestyle modifications  See medications in patient instructions if given  Patient will follow up in 6-8 weeks

## 2016-06-25 NOTE — Assessment & Plan Note (Signed)
Overall patient seems to be doing relatively well. No significant change in management. 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 We discussed scapular retraction and could be beneficial. We discussed core strengthening. Follow-up again in 6-8 weeks.

## 2016-06-26 ENCOUNTER — Ambulatory Visit (INDEPENDENT_AMBULATORY_CARE_PROVIDER_SITE_OTHER): Payer: 59 | Admitting: Family Medicine

## 2016-06-26 ENCOUNTER — Encounter: Payer: Self-pay | Admitting: Family Medicine

## 2016-06-26 VITALS — BP 130/82 | HR 77 | Wt 150.0 lb

## 2016-06-26 DIAGNOSIS — M9901 Segmental and somatic dysfunction of cervical region: Secondary | ICD-10-CM

## 2016-06-26 DIAGNOSIS — M9903 Segmental and somatic dysfunction of lumbar region: Secondary | ICD-10-CM

## 2016-06-26 DIAGNOSIS — M9902 Segmental and somatic dysfunction of thoracic region: Secondary | ICD-10-CM

## 2016-06-26 DIAGNOSIS — M542 Cervicalgia: Secondary | ICD-10-CM | POA: Diagnosis not present

## 2016-06-26 DIAGNOSIS — M999 Biomechanical lesion, unspecified: Secondary | ICD-10-CM

## 2016-06-26 DIAGNOSIS — G8929 Other chronic pain: Secondary | ICD-10-CM

## 2016-06-26 MED FILL — PANTOPRAZOLE SOD DR 40 MG T: 40 | 90 days supply | Qty: 90 | Fill #0

## 2016-06-26 MED FILL — LEVOTHYROXINE 75 MCG TABLET: 75 | 90 days supply | Qty: 90 | Fill #2

## 2016-06-26 NOTE — Patient Instructions (Signed)
You are awesome Got nothing new because you are doing amazing.  See me again in 3 months.

## 2016-07-04 DIAGNOSIS — M170 Bilateral primary osteoarthritis of knee: Secondary | ICD-10-CM | POA: Diagnosis not present

## 2016-07-12 DIAGNOSIS — M1711 Unilateral primary osteoarthritis, right knee: Secondary | ICD-10-CM | POA: Diagnosis not present

## 2016-07-12 DIAGNOSIS — M1712 Unilateral primary osteoarthritis, left knee: Secondary | ICD-10-CM | POA: Diagnosis not present

## 2016-07-19 DIAGNOSIS — M1711 Unilateral primary osteoarthritis, right knee: Secondary | ICD-10-CM | POA: Diagnosis not present

## 2016-07-19 DIAGNOSIS — M1712 Unilateral primary osteoarthritis, left knee: Secondary | ICD-10-CM | POA: Diagnosis not present

## 2016-07-26 DIAGNOSIS — M170 Bilateral primary osteoarthritis of knee: Secondary | ICD-10-CM | POA: Diagnosis not present

## 2016-08-02 ENCOUNTER — Other Ambulatory Visit: Payer: Self-pay | Admitting: Internal Medicine

## 2016-08-02 DIAGNOSIS — Z1231 Encounter for screening mammogram for malignant neoplasm of breast: Secondary | ICD-10-CM

## 2016-08-10 MED FILL — BUPROPION HCL SR 200 MG TAB: 200 | 90 days supply | Qty: 90 | Fill #1

## 2016-08-12 MED FILL — NABUMETONE 500 MG TABLET: 500 | 30 days supply | Qty: 60 | Fill #2

## 2016-08-28 ENCOUNTER — Encounter: Payer: Self-pay | Admitting: *Deleted

## 2016-08-28 DIAGNOSIS — G4700 Insomnia, unspecified: Secondary | ICD-10-CM

## 2016-08-28 DIAGNOSIS — M3501 Sicca syndrome with keratoconjunctivitis: Secondary | ICD-10-CM

## 2016-08-28 DIAGNOSIS — E559 Vitamin D deficiency, unspecified: Secondary | ICD-10-CM

## 2016-08-28 DIAGNOSIS — H16229 Keratoconjunctivitis sicca, not specified as Sjogren's, unspecified eye: Secondary | ICD-10-CM

## 2016-08-28 DIAGNOSIS — N29 Other disorders of kidney and ureter in diseases classified elsewhere: Secondary | ICD-10-CM

## 2016-08-28 DIAGNOSIS — M170 Bilateral primary osteoarthritis of knee: Secondary | ICD-10-CM

## 2016-08-28 DIAGNOSIS — M1990 Unspecified osteoarthritis, unspecified site: Secondary | ICD-10-CM

## 2016-08-28 DIAGNOSIS — M19041 Primary osteoarthritis, right hand: Secondary | ICD-10-CM

## 2016-08-28 DIAGNOSIS — M62838 Other muscle spasm: Secondary | ICD-10-CM | POA: Insufficient documentation

## 2016-08-28 DIAGNOSIS — M19072 Primary osteoarthritis, left ankle and foot: Secondary | ICD-10-CM

## 2016-08-28 DIAGNOSIS — M3500 Sicca syndrome, unspecified: Secondary | ICD-10-CM

## 2016-08-28 DIAGNOSIS — M19071 Primary osteoarthritis, right ankle and foot: Secondary | ICD-10-CM

## 2016-08-28 DIAGNOSIS — M19042 Primary osteoarthritis, left hand: Secondary | ICD-10-CM

## 2016-08-28 DIAGNOSIS — M1120 Other chondrocalcinosis, unspecified site: Secondary | ICD-10-CM | POA: Insufficient documentation

## 2016-08-28 HISTORY — DX: Primary osteoarthritis, right ankle and foot: M19.071

## 2016-08-28 HISTORY — DX: Insomnia, unspecified: G47.00

## 2016-08-28 HISTORY — DX: Sjogren syndrome with keratoconjunctivitis: M35.01

## 2016-08-28 HISTORY — DX: Unspecified osteoarthritis, unspecified site: M19.90

## 2016-08-28 HISTORY — DX: Bilateral primary osteoarthritis of knee: M17.0

## 2016-08-28 HISTORY — DX: Primary osteoarthritis, left hand: M19.041

## 2016-08-28 HISTORY — DX: Sjogren syndrome, unspecified: M35.00

## 2016-08-28 HISTORY — DX: Other disorders of calcium metabolism: E83.59

## 2016-08-28 HISTORY — DX: Keratoconjunctivitis sicca, not specified as Sjogren's, unspecified eye: H16.229

## 2016-08-28 HISTORY — DX: Vitamin D deficiency, unspecified: E55.9

## 2016-08-28 NOTE — Progress Notes (Signed)
*IMAGE* Office Visit Note  Patient: Shelly [name]             Date of Birth: 12-03-1960           MRN: [id number]             PCP: Kelton Pillar, MD Referring: Lanice Shirts, * Visit Date: 08/29/2016 Occupation:@GUAROCC @    Subjective:  No chief complaint on file. Follow-up on inflammatory arthritis and pseudogout.  History of Present Illness: Shelly [name] is a 55 y.o. female seen 12/20/2015 for inflammatory arthritis which is being well addressed with NAB UMETONE.  Patient also has pseudogout and is responding well to the same medication. Patient's trapezius muscle spasms bilaterally was addressed by giving cortisone injection mixed with lidocaine. Her OA of the knee joint and knee joint pain or well addressed with giving Orthovisc injections on 03/31/2015 and then again sometime in spring summer of 2017.  Today the patient states that she is doing well with her inflammatory arthritis as well as pseudo gout. Relafen is working well for both. She uses it from time to time and is not a every day medicine at this time. The left great toe was affected by pseudogout in the past and it has not bothered her in a long time.  She is also observing a new diet that is less Pasta less bread and she has lost approximately 20 pounds and is feeling better. I've encouraged the patient to continue proper nutrition exercise.  Patient finished her Orthovisc injections to both knees 07/26/2016. She reports that she is doing very well with this. She rates her discomfort as 0 on a scale of 0-10. She does have occasional twinge to her right knee  Patient was also having trapezius muscle pain in the past as well as back problems and her PCP referred her to Dr. Gardenia Phlegm of [place] who is a D.O. He was able to give her some adjustments and patient felt immediate relief in her neck as well as her back. She plans to see him on a every 3-4 month basis since it's helped her out so much. I'm agreeable with  this plan and I've encouraged her to continue this.  Activities of Daily Living:  Patient reports morning stiffness for 15 minutes.   Patient Denies nocturnal pain.  Difficulty dressing/grooming: Denies Difficulty climbing stairs: Denies Difficulty getting out of chair: Denies Difficulty using hands for taps, buttons, cutlery, and/or writing: Denies   Review of Systems  Constitutional: Negative for fatigue.  HENT: Negative for mouth sores and mouth dryness.   Eyes: Negative for dryness.  Respiratory: Negative for shortness of breath.   Gastrointestinal: Negative for constipation and diarrhea.  Musculoskeletal: Negative for myalgias and myalgias.  Skin: Negative for sensitivity to sunlight.  Psychiatric/Behavioral: Negative for decreased concentration and sleep disturbance.    PMFS History:  Patient Active Problem List   Diagnosis Date Noted  . Inflammatory arthritis 08/28/2016  . Osteoarthritis of both hands 08/28/2016  . Osteoarthritis of both feet 08/28/2016  . Osteoarthritis of both knees 08/28/2016  . Sicca ([place]) 08/28/2016  . Insomnia 08/28/2016  . 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 Vitamin D deficiency 08/28/2016  . Pseudogout 08/28/2016  . Trapezius muscle spasm 08/28/2016  . Chronic neck pain 03/01/2016  . Scapular dyskinesis 03/01/2016  . Nonallopathic lesion of cervical region 03/01/2016  . Nonallopathic lesion of thoracic region 03/01/2016  . Nonallopathic lesion of lumbosacral region 03/01/2016  . DUB (dysfunctional uterine bleeding) 01/05/2014  .  Family history of abdominal aortic aneurysm 08/12/2012  . Renal calculus, left 06/04/2012  . Irritability 05/01/2012  . GERD (gastroesophageal reflux disease) 05/01/2012  . History of migraine 05/01/2012  . Hypothyroidism 05/01/2012  . History of renal calculi 05/01/2012  . Leukopenia 05/01/2012  . 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 Allergic rhinitis due to allergen 05/01/2012    Past Medical History:   Diagnosis Date  . Chronic kidney disease    kidney stones  . Depression   . Endometriosis   . Fibroid   . GERD (gastroesophageal reflux disease)   . Heart murmur   . Hypothyroid   . Inflammatory arthritis 08/28/2016  . Insomnia 08/28/2016  . Migraine   . Osteoarthritis of both feet 08/28/2016  . Osteoarthritis of both hands 08/28/2016  . Osteoarthritis of both knees 08/28/2016  . 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 Sicca ([place]) 08/28/2016  . Vitamin D deficiency 08/28/2016    Family History  Problem Relation Age of Onset  . Hypertension Mother   . Hypertension Father   . Stroke Father   . Diabetes Father   . Heart disease Father   . Hypertension Brother   . Hypertension Maternal Grandmother   . Hypertension Maternal Grandfather   . Kidney failure Maternal Grandfather   . Aneurysm Maternal Grandfather   . Cancer Maternal Aunt 42    breast  . Aneurysm Maternal Aunt   . Cancer Cousin     breast   Past Surgical History:  Procedure Laterality Date  . LAPAROSCOPY  1994   endometriosis  . PELVIC LAPAROSCOPY  1993   d/t endometriosis--Dr. Matthew Saras   Social History   Social History Narrative  . No narrative on file     Objective: Vital Signs: BP 128/72   Pulse 74   Resp 14   Ht 5' 5.5" (1.664 m)   Wt 153 lb (69.4 kg)   LMP 02/22/2014   BMI 25.07 kg/m    Physical Exam  Constitutional: She is oriented to person, place, and time. She appears well-developed and well-nourished.  HENT:  Head: Normocephalic and atraumatic.  Eyes: EOM are normal. Pupils are equal, round, and reactive to light.  Cardiovascular: Normal rate, regular rhythm and normal heart sounds.  Exam reveals no gallop and no friction rub.   No murmur heard. Pulmonary/Chest: Effort normal and breath sounds normal. She has no wheezes. She has no rales.  Abdominal: Soft. Bowel sounds are normal. She exhibits no distension. There is no tenderness. There is no guarding. No hernia.  Musculoskeletal: Normal range of  motion. She exhibits no edema, tenderness or deformity.  Lymphadenopathy:    She has no cervical adenopathy.  Neurological: She is alert and oriented to person, place, and time. Coordination normal.  Skin: Skin is warm and dry. Capillary refill takes less than 2 seconds. No rash noted.  Psychiatric: She has a normal mood and affect. Her behavior is normal.  Nursing note and vitals reviewed.    Musculoskeletal Exam:   Full range of motion of all joints Grip strength is equal and strong bilaterally Fibromyalgia tender points are all absent  CDAI Exam: CDAI Homunculus Exam:   Joint Counts:  CDAI Tender Joint count: 0 CDAI Swollen Joint count: 0   On examination today she does not have any synovitis.   Investigation: Findings:    Her last vitamin D on record was on 01/10/2015 when it was 24. By her PCP and patient states that it was addressed by the PCP.  She is planning on seeing her PCP in about January February for the annual physical exam at which time they'll address the vitamin D again. She is taking vitamin D supplements currently. Eyes her to discuss all of this with her PCP for the vitamin D   Imaging: No results found.  Speciality Comments: No specialty comments available.    Procedures:  No procedures performed Allergies: Compazine [prochlorperazine edisylate]   Assessment / Plan: Visit Diagnoses: Inflammatory arthritis  Pseudogout  Primary osteoarthritis of both knees  Trapezius muscle spasm   Patient is doing well with Relafen for her inflammatory arthritis and pseudogout. We want the patient to continue this.  Patient is doing well with adjustments from Dr. Gardenia Phlegm Dr. of osteopenia optically at lower. She will continue to see him every 3-4 months for adjustments when needed.  She is doing well with Orthovisc and she wants to restart the series again when it's time which is about 6 months from September 29. By my estimation at skin to be  approximately April 2018 that when she needs it. We will put a message in our system so that way we can remind herself to [facility] for the medication. We would like Orthovisc but if her insurance company decides Euflex or Hyalgan is the one that they prefer we will settle for those as well. She will need the injections to both knees. Patient is agreeable with this and wants to move 4. Already she seen benefit and she is not having any sounds still/creaking noises coming from her knees as she is ascending or descending the stairs.   She will get her labs through the PCPs office for an upcoming annual visit on January February 2018. Therefore not ordering any labs. I do see the last labs that we have for the patient is dated March 2016 which showed normal CMP with GFR and normal CBC with differential except for slightly low white count at 3.5  Return to clinic in 6 months  Refill Relafen Orders: No orders of the defined types were placed in this encounter.  Meds ordered this encounter  Medications  . nabumetone (RELAFEN) 500 MG tablet    Sig: Take 1 tablet (500 mg total) by mouth daily.    Dispense:  90 tablet    Refill:  1    Face-to-face time spent with patient was 30 minutes. 50% of time was spent in counseling and coordination of care.  Follow-Up Instructions: Return in about 6 months (around 02/26/2017) for Inflam Arthrits, PseudoGout, OA KJ.

## 2016-08-29 ENCOUNTER — Ambulatory Visit (INDEPENDENT_AMBULATORY_CARE_PROVIDER_SITE_OTHER): Payer: 59 | Admitting: Rheumatology

## 2016-08-29 ENCOUNTER — Ambulatory Visit: Payer: 59

## 2016-08-29 ENCOUNTER — Encounter: Payer: Self-pay | Admitting: Rheumatology

## 2016-08-29 VITALS — BP 128/72 | HR 74 | Resp 14 | Ht 65.5 in | Wt 153.0 lb

## 2016-08-29 DIAGNOSIS — M1120 Other chondrocalcinosis, unspecified site: Secondary | ICD-10-CM

## 2016-08-29 DIAGNOSIS — M62838 Other muscle spasm: Secondary | ICD-10-CM

## 2016-08-29 DIAGNOSIS — M170 Bilateral primary osteoarthritis of knee: Secondary | ICD-10-CM | POA: Diagnosis not present

## 2016-08-29 DIAGNOSIS — M1990 Unspecified osteoarthritis, unspecified site: Secondary | ICD-10-CM | POA: Diagnosis not present

## 2016-08-29 MED ORDER — NABUMETONE 500 MG PO TABS: 500.0000 mg | ORAL_TABLET | Freq: Every day | ORAL | 1 refills | Status: AC

## 2016-09-27 ENCOUNTER — Ambulatory Visit: Payer: 59

## 2016-09-27 ENCOUNTER — Ambulatory Visit
Admission: RE | Admit: 2016-09-27 | Discharge: 2016-09-27 | Disposition: A | Discharge status: Home / Self Care | Payer: 59 | Source: Ambulatory Visit | Attending: Internal Medicine | Admitting: Internal Medicine

## 2016-09-27 DIAGNOSIS — Z1231 Encounter for screening mammogram for malignant neoplasm of breast: Secondary | ICD-10-CM | POA: Diagnosis not present

## 2016-10-08 ENCOUNTER — Ambulatory Visit (INDEPENDENT_AMBULATORY_CARE_PROVIDER_SITE_OTHER): Payer: 59 | Admitting: Family Medicine

## 2016-10-08 ENCOUNTER — Encounter: Payer: Self-pay | Admitting: Family Medicine

## 2016-10-08 VITALS — BP 138/80 | HR 72 | Ht 66.0 in | Wt 154.0 lb

## 2016-10-08 DIAGNOSIS — M999 Biomechanical lesion, unspecified: Secondary | ICD-10-CM | POA: Diagnosis not present

## 2016-10-08 DIAGNOSIS — G2589 Other specified extrapyramidal and movement disorders: Secondary | ICD-10-CM | POA: Diagnosis not present

## 2016-10-08 NOTE — Assessment & Plan Note (Signed)
Decision today to treat with OMT was based on Physical Exam  After verbal consent patient was treated with HVLA, ME, FPR techniques in Cervical, thoracic, and lumbar and sacral areas  Patient tolerated the procedure well with improvement in symptoms  Patient given exercises, stretches and lifestyle modifications  See medications in patient instructions if given  Patient will follow up in 3-4 months

## 2016-10-08 NOTE — Assessment & Plan Note (Signed)
W most of the pain seems to be secondary to the scapular dyskinesia. We discussed with patient at great length. We discussed icing regimen and home exercises. Topical anti-inflammatories if needed. Patient will follow-up with me again in 3-4 months.

## 2016-10-08 NOTE — Patient Instructions (Addendum)
you are doing awesome.  Keep it up  No real changes but need to continue to work on posture and keep shoulders back when you think about it.  Ice is your friend.  Continue same meds See me again in 6 weeks!

## 2016-10-08 NOTE — Progress Notes (Signed)
[name] [address], [place] [address] Phone: [phone] Subjective:     CC: neck pain f/u  [id number]  Shelly [name] is a 55 y.o. female coming in with complaint of neck pain. Patient sent have more of a scapular dysfunction and poor posture. Patient states patient is doing relatively well overall. No significant pain. Not working on a regular basis or doing the home exercises. Patient states that overall she seems to be doing well. No severe pain. Continues to have some mild tightness over the course last week but otherwise was doing well.       Past Medical History:  Diagnosis Date  . Chronic kidney disease    kidney stones  . Depression   . Endometriosis   . Fibroid   . GERD (gastroesophageal reflux disease)   . Heart murmur   . Hypothyroid   . Inflammatory arthritis 08/28/2016  . Insomnia 08/28/2016  . Migraine   . Osteoarthritis of both feet 08/28/2016  . Osteoarthritis of both hands 08/28/2016  . Osteoarthritis of both knees 08/28/2016  . 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 Sicca ([place]) 08/28/2016  . Vitamin D deficiency 08/28/2016   Past Surgical History:  Procedure Laterality Date  . LAPAROSCOPY  1994   endometriosis  . PELVIC LAPAROSCOPY  1993   d/t endometriosis--Dr. Matthew Saras   Social History   Social History  . Marital status: Married    Spouse name: N/A  . Number of children: 2  . Years of education: N/A   Occupational History  . RN [employer]   Social History Main Topics  . Smoking status: Never Smoker  . Smokeless tobacco: Never Used  . Alcohol use No  . Drug use: No  . Sexual activity: Yes    Partners: Male    Birth control/ protection: None   Other Topics Concern  . None   Social History Narrative  . None   Allergies  Allergen Reactions  . Compazine [Prochlorperazine Edisylate] Other (See Comments)    Muscle contractions   Family History  Problem Relation Age of Onset  . Hypertension  Mother   . Hypertension Father   . Stroke Father   . Diabetes Father   . Heart disease Father   . Hypertension Brother   . Hypertension Maternal Grandmother   . Hypertension Maternal Grandfather   . Kidney failure Maternal Grandfather   . Aneurysm Maternal Grandfather   . Cancer Maternal Aunt 42    breast  . Aneurysm Maternal Aunt   . Cancer Cousin     breast    Past medical history, social, surgical and family history all reviewed in electronic medical record.  No pertanent information unless stated regarding to the chief complaint.   Review of Systems: No headache, visual changes, nausea, vomiting, diarrhea, constipation, dizziness, abdominal pain, skin rash, fevers, chills, night sweats, weight loss, swollen lymph nodes, body aches, joint swelling, muscle aches, chest pain, shortness of breath, mood changes.    Objective  Blood pressure 138/80, pulse 72, height 5\' 6"  (1.676 m), weight 154 lb (69.9 kg), last menstrual period 02/22/2014.  Systems examined below as of 10/08/16 General: NAD A&O x3 mood, affect normal  HEENT: Pupils equal, extraocular movements intact no nystagmus Respiratory: not short of breath at rest or with speaking Cardiovascular: No lower extremity edema, non tender Skin: Warm dry intact with no signs of infection or rash on extremities or on axial skeleton. Abdomen: Soft nontender, no masses Neuro:  Cranial nerves  intact, neurovascularly intact in all extremities with 2+ DTRs and 2+ pulses. Lymph: No lymphadenopathy appreciated today  Gait normal with good balance and coordination.  MSK: Non tender with full range of motion and good stability and symmetric strength and tone of shoulders, elbows, wrist,  knee hips and ankles bilaterally.   Neck: Inspection unremarkable. No palpable stepoffs. Negative Spurling's maneuver. Very mild tightness noted on the right side of the neck Grip strength and sensation normal in bilateral hands Strength good C4 to T1  distribution No sensory change to C4 to T1 Negative Hoffman sign bilaterally Reflexes normal Continued mild scapular dyskinesia no significant change from previous exam   Osteopathic findings C2 flexed rotated and side bent left C5 flexed rotated and side bent right T2 extended rotated and side bent left Inhaled second rib T7 extended rotated and side bent right L2 flexed rotated and side bent right Sacrum left on left    Impression and Recommendations:     This case required medical decision making of moderate complexity.      Note: This dictation was prepared with Dragon dictation along with smaller phrase technology. Any transcriptional errors that result from this process are unintentional.

## 2016-10-14 MED FILL — LEVOTHYROXINE 75 MCG TABLET: 75 | 90 days supply | Qty: 90 | Fill #3

## 2016-10-14 MED FILL — PANTOPRAZOLE SOD DR 40 MG T: 40 | 90 days supply | Qty: 90 | Fill #1

## 2016-10-16 MED FILL — XIIDRA 5% EYE DROPS: 5 | 30 days supply | Qty: 60 | Fill #0

## 2016-10-29 DIAGNOSIS — S83242A Other tear of medial meniscus, current injury, left knee, initial encounter: Secondary | ICD-10-CM | POA: Diagnosis not present

## 2016-11-01 DIAGNOSIS — M25562 Pain in left knee: Secondary | ICD-10-CM | POA: Diagnosis not present

## 2016-11-05 ENCOUNTER — Ambulatory Visit: Payer: 59 | Admitting: Family Medicine

## 2016-11-05 DIAGNOSIS — M25562 Pain in left knee: Secondary | ICD-10-CM | POA: Diagnosis not present

## 2016-11-05 MED FILL — VIT D2 1.25 MG (50,000 UNIT: 1.25 MG | 42 days supply | Qty: 6 | Fill #0

## 2016-11-05 MED FILL — HYDROCODON-APAP 5-325: 5-325 | 8 days supply | Qty: 30 | Fill #0

## 2016-11-25 MED FILL — BUPROPION HCL SR 200 MG TAB: 200 | 90 days supply | Qty: 90 | Fill #2

## 2016-11-27 DIAGNOSIS — H524 Presbyopia: Secondary | ICD-10-CM | POA: Diagnosis not present

## 2016-12-10 DIAGNOSIS — M25562 Pain in left knee: Secondary | ICD-10-CM | POA: Diagnosis not present

## 2017-01-07 ENCOUNTER — Ambulatory Visit (INDEPENDENT_AMBULATORY_CARE_PROVIDER_SITE_OTHER): Payer: 59 | Admitting: Family Medicine

## 2017-01-07 ENCOUNTER — Encounter: Payer: Self-pay | Admitting: Family Medicine

## 2017-01-07 VITALS — BP 150/88 | HR 84 | Ht 66.0 in | Wt 156.0 lb

## 2017-01-07 DIAGNOSIS — G2589 Other specified extrapyramidal and movement disorders: Secondary | ICD-10-CM

## 2017-01-07 DIAGNOSIS — M999 Biomechanical lesion, unspecified: Secondary | ICD-10-CM

## 2017-01-07 MED ORDER — VITAMIN D (ERGOCALCIFEROL) 1.25 MG (50000 UNIT) PO CAPS: 50000.0000 [IU] | ORAL_CAPSULE | ORAL | 0 refills | Status: DC

## 2017-01-07 MED FILL — VIT D2 1.25 MG (50,000 UNIT: 1.25 MG | 84 days supply | Qty: 12 | Fill #0

## 2017-01-07 NOTE — Patient Instructions (Addendum)
Sorry about the knee.  Once weekly vitamin D for next 12 weeks.  Lets continue to increase activity as tolerated.  I think a little stress is playing a role.  See em again in 3-4 weeks.

## 2017-01-07 NOTE — Progress Notes (Signed)
[name] [address] [place], [place] [address] Phone: [phone] Subjective:     CC: neck pain f/u  [id number]  Shelly [name] is a 56 y.o. female coming in with complaint of neck pain.Patient has had some increasing pain recently. Patient has been somewhat exiting anxious. Has been out of work because patient did have a tibial plateau fracture of the left knee. Fell on it. Had significant pain and swelling immediately. Diagnosed with MRI. Following up with surgeon at the end of the week. Patient has been able to return to work but continues to have discomfort. Patient states that she is unable to straighten the knee completely. Patient states that sometimes it just does not feel stable.       Past Medical History:  Diagnosis Date  . Chronic kidney disease    kidney stones  . Depression   . Endometriosis   . Fibroid   . GERD (gastroesophageal reflux disease)   . Heart murmur   . Hypothyroid   . Inflammatory arthritis 08/28/2016  . Insomnia 08/28/2016  . Migraine   . Osteoarthritis of both feet 08/28/2016  . Osteoarthritis of both hands 08/28/2016  . Osteoarthritis of both knees 08/28/2016  . 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 Sicca ([place]) 08/28/2016  . Vitamin D deficiency 08/28/2016   Past Surgical History:  Procedure Laterality Date  . LAPAROSCOPY  1994   endometriosis  . PELVIC LAPAROSCOPY  1993   d/t endometriosis--Dr. Matthew Saras   Social History   Social History  . Marital status: Married    Spouse name: N/A  . Number of children: 2  . 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 Smoking status: Never Smoker  . Smokeless tobacco: Never Used  . Alcohol use No  . Drug use: No  . Sexual activity: Yes    Partners: Male    Birth control/ protection: None   Other Topics Concern  . Not on file   Social History Narrative  . No narrative on file   Allergies  Allergen  Reactions  . Compazine [Prochlorperazine Edisylate] Other (See Comments)    Muscle contractions   Family History  Problem Relation Age of Onset  . Hypertension Mother   . Hypertension Father   . Stroke Father   . Diabetes Father   . Heart disease Father   . Hypertension Brother   . Hypertension Maternal Grandmother   . Hypertension Maternal Grandfather   . Kidney failure Maternal Grandfather   . Aneurysm Maternal Grandfather   . Cancer Maternal Aunt 42    breast  . Aneurysm Maternal Aunt   . Cancer Cousin     breast    Past medical history, social, surgical and family history all reviewed in electronic medical record.  No pertanent information unless stated regarding to the chief complaint.   Review of Systems: No headache, visual changes, nausea, vomiting, diarrhea, constipation, dizziness, abdominal pain, skin rash, fevers, chills, night sweats, weight loss, swollen lymph nodes, body aches,chest pain, shortness of breath, mood changes.  Positive muscle aches as well as some mild joint swelling, left knee   Objective  Last menstrual period 02/22/2014.  Systems examined below as of 01/07/17 General: NAD A&O x3 mood, affect normal  HEENT: Pupils equal, extraocular movements intact no nystagmus Respiratory: not short of breath at rest or with speaking Cardiovascular: No lower extremity edema, non tender Skin: Warm dry  intact with no signs of infection or rash on extremities or on axial skeleton. Abdomen: Soft nontender, no masses Neuro: Cranial nerves  intact, neurovascularly intact in all extremities with 2+ DTRs and 2+ pulses. Lymph: No lymphadenopathy appreciated today  Gait Mild antalgic gait  MSK: Non tender with full range of motion and good stability and symmetric strength and tone of shoulders, elbows, wrist,  hips and ankles bilaterally.   Knee: Left  Trace swelling compared to contralateral knee Palpation normal with no warmth, joint line tenderness, patellar  tenderness, or condyle tenderness. Lacks last 5 of extension and last 5 of flexion Ligaments with solid consistent endpoints including ACL, PCL, LCL, MCL. Negative Mcmurray's, Apley's, and Thessalonian tests. painful patellar compression. Patellar glide without crepitus. Patellar and quadriceps tendons unremarkable. Hamstring and quadriceps strength is normal.   Neck: Inspection unremarkable. No palpable stepoffs. Negative Spurling's maneuver. Increasing tightness on the right side of the neck Strength good C4 to T1 distribution No sensory change to C4 to T1 Negative Hoffman sign bilaterally Reflexes normal Continued mild scapular dyskinesia no significant change from previous exam   Osteopathic findings Cervical C2 flexed rotated and side bent right C4 flexed rotated and side bent left C6 flexed rotated and side bent left T3 extended rotated and side bent right inhaled third rib T7 extended rotated and side bent left L3 flexed rotated and side bent right Sacrum right on right     Impression and Recommendations:     This case required medical decision making of moderate complexity.      Note: This dictation was prepared with Dragon dictation along with smaller phrase technology. Any transcriptional errors that result from this process are unintentional.

## 2017-01-07 NOTE — Assessment & Plan Note (Signed)
I believe the patient has not been able to do the exercises regularly secondary to her most recent injury. This is caused more muscle imbalances. Has responded well to manipulation in the past and I do think that we need to increase activity again. We will increase frequency of office visits as well with patient having improvement immediately after the manipulation. Patient will come back and see me again in 3-4 weeks.

## 2017-01-07 NOTE — Assessment & Plan Note (Signed)
Decision today to treat with OMT was based on Physical Exam  After verbal consent patient was treated with HVLA, ME, FPR techniques in cervical, thoracic, lumbar and sacral areas  Patient tolerated the procedure well with improvement in symptoms  Patient given exercises, stretches and lifestyle modifications  See medications in patient instructions if given  Patient will follow up in 4 weeks 

## 2017-01-10 MED FILL — LEVOTHYROXINE 75 MCG TABLET: 75 | 90 days supply | Qty: 90 | Fill #0

## 2017-01-10 MED FILL — PANTOPRAZOLE SOD DR 40 MG T: 40 | 90 days supply | Qty: 90 | Fill #0

## 2017-01-17 DIAGNOSIS — M25562 Pain in left knee: Secondary | ICD-10-CM | POA: Diagnosis not present

## 2017-01-21 DIAGNOSIS — M25662 Stiffness of left knee, not elsewhere classified: Secondary | ICD-10-CM | POA: Diagnosis not present

## 2017-01-21 DIAGNOSIS — S82102D Unspecified fracture of upper end of left tibia, subsequent encounter for closed fracture with routine healing: Secondary | ICD-10-CM | POA: Diagnosis not present

## 2017-01-21 DIAGNOSIS — M25562 Pain in left knee: Secondary | ICD-10-CM | POA: Diagnosis not present

## 2017-01-21 DIAGNOSIS — M6281 Muscle weakness (generalized): Secondary | ICD-10-CM | POA: Diagnosis not present

## 2017-02-03 DIAGNOSIS — M6281 Muscle weakness (generalized): Secondary | ICD-10-CM | POA: Diagnosis not present

## 2017-02-03 DIAGNOSIS — M25662 Stiffness of left knee, not elsewhere classified: Secondary | ICD-10-CM | POA: Diagnosis not present

## 2017-02-03 DIAGNOSIS — M25562 Pain in left knee: Secondary | ICD-10-CM | POA: Diagnosis not present

## 2017-02-03 DIAGNOSIS — S82102D Unspecified fracture of upper end of left tibia, subsequent encounter for closed fracture with routine healing: Secondary | ICD-10-CM | POA: Diagnosis not present

## 2017-02-04 ENCOUNTER — Ambulatory Visit (INDEPENDENT_AMBULATORY_CARE_PROVIDER_SITE_OTHER): Payer: 59 | Admitting: Family Medicine

## 2017-02-04 ENCOUNTER — Encounter: Payer: Self-pay | Admitting: Family Medicine

## 2017-02-04 VITALS — BP 144/92 | HR 62 | Resp 16 | Wt 154.1 lb

## 2017-02-04 DIAGNOSIS — G2589 Other specified extrapyramidal and movement disorders: Secondary | ICD-10-CM | POA: Diagnosis not present

## 2017-02-04 DIAGNOSIS — M999 Biomechanical lesion, unspecified: Secondary | ICD-10-CM

## 2017-02-04 NOTE — Progress Notes (Signed)
[name] [place], [place] [address] Phone: [phone] Subjective:     CC: neck pain f/u  [id number]  Shelly [name] is a 56 y.o. female coming in with complaint of neck pain.Patient has had some increasing pain recently. Patient states that she is been doing well regarding recently. Patient has also started rehabilitation for her knee. Noticing more neck and lower back tightness. Patient denies any numbness. Rates the severity of pain though is 4 out of 10.        Past Medical History:  Diagnosis Date  . Chronic kidney disease    kidney stones  . Depression   . Endometriosis   . Fibroid   . GERD (gastroesophageal reflux disease)   . Heart murmur   . Hypothyroid   . Inflammatory arthritis 08/28/2016  . Insomnia 08/28/2016  . Migraine   . Osteoarthritis of both feet 08/28/2016  . Osteoarthritis of both hands 08/28/2016  . Osteoarthritis of both knees 08/28/2016  . 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 Sicca ([place]) 08/28/2016  . Vitamin D deficiency 08/28/2016   Past Surgical History:  Procedure Laterality Date  . LAPAROSCOPY  1994   endometriosis  . PELVIC LAPAROSCOPY  1993   d/t endometriosis--Dr. Matthew Saras   Social History   Social History  . Marital status: Married    Spouse name: N/A  . Number of children: 2  . Years of education: N/A   Occupational History  . RN [employer]   Social History Main Topics  . Smoking status: Never Smoker  . Smokeless tobacco: Never Used  . Alcohol use No  . Drug use: No  . Sexual activity: Yes    Partners: Male    Birth control/ protection: None   Other Topics Concern  . None   Social History Narrative  . None   Allergies  Allergen Reactions  . Compazine [Prochlorperazine Edisylate] Other (See Comments)    Muscle contractions   Family History  Problem Relation Age of Onset  . Hypertension Mother   . Hypertension Father   . Stroke Father   . Diabetes Father   . Heart  disease Father   . Hypertension Brother   . Hypertension Maternal Grandmother   . Hypertension Maternal Grandfather   . Kidney failure Maternal Grandfather   . Aneurysm Maternal Grandfather   . Cancer Maternal Aunt 42    breast  . Aneurysm Maternal Aunt   . Cancer Cousin     breast    Past medical history, social, surgical and family history all reviewed in electronic medical record.  No pertanent information unless stated regarding to the chief complaint.   Review of Systems: No headache, visual changes, nausea, vomiting, diarrhea, constipation, dizziness, abdominal pain, skin rash, fevers, chills, night sweats, weight loss, swollen lymph nodes, body aches, joint swelling, muscle aches, chest pain, shortness of breath, mood changes.     Objective  Blood pressure (!) 144/92, pulse 62, resp. rate 16, weight 154 lb 2 oz (69.9 kg), last menstrual period 02/22/2014, SpO2 99 %.  Systems examined below as of 02/04/17 General: NAD A&O x3 mood, affect normal  HEENT: Pupils equal, extraocular movements intact no nystagmus Respiratory: not short of breath at rest or with speaking Cardiovascular: No lower extremity edema, non tender Skin: Warm dry intact with no signs of infection or rash on extremities or on axial skeleton. Abdomen: Soft nontender, no masses Neuro: Cranial nerves  intact, neurovascularly intact in all extremities with  2+ DTRs and 2+ pulses. Lymph: No lymphadenopathy appreciated today  Gait Mild antalgic gait MSK: Non tender with full range of motion and good stability and symmetric strength and tone of shoulders, elbows, wrist,  hips and ankles bilaterally.     Neck: Inspection unremarkable. No palpable stepoffs. Negative Spurling's maneuver. Increasing tightness on the left side of the neck which is different than previous exam Strength good C4 to T1 distribution No sensory change to C4 to T1 Negative Hoffman sign bilaterally Reflexes normal Continued mild scapular  dyskinesia no significant change from previous exam  Mild increased tightness of the right sacroiliac joint with a positive Faber test. Negative straight leg test.  Osteopathic findings Cervical C2 flexed rotated and side bent right C4 flexed rotated and side bent left C6 flexed rotated and side bent right T3 extended rotated and side bent left inhaled third rib T9 extended rotated and side bent left L2 flexed rotated and side bent right Sacrum right on right      Impression and Recommendations:     This case required medical decision making of moderate complexity.      Note: This dictation was prepared with Dragon dictation along with smaller phrase technology. Any transcriptional errors that result from this process are unintentional.

## 2017-02-04 NOTE — Progress Notes (Signed)
Pre-visit discussion using our clinic review tool. No additional management support is needed unless otherwise documented below in the visit note.  

## 2017-02-04 NOTE — Assessment & Plan Note (Signed)
Discussed with patient in great length. Discussed that we need to continue to monitor closely. Patient is a cane working on muscle strength and function. Discussed icing regimen. Discussed posture and ergonomics. Follow-up again in 6-8 weeks

## 2017-02-04 NOTE — Patient Instructions (Signed)
6-8 weeks 

## 2017-02-04 NOTE — Assessment & Plan Note (Signed)
Decision today to treat with OMT was based on Physical Exam  After verbal consent patient was treated with HVLA, ME, FPR techniques in cervical, thoracic, lumbar and sacral areas  Patient tolerated the procedure well with improvement in symptoms  Patient given exercises, stretches and lifestyle modifications  See medications in patient instructions if given  Patient will follow up in 6-8 weeks 

## 2017-02-12 DIAGNOSIS — M25562 Pain in left knee: Secondary | ICD-10-CM | POA: Diagnosis not present

## 2017-02-12 DIAGNOSIS — S82102D Unspecified fracture of upper end of left tibia, subsequent encounter for closed fracture with routine healing: Secondary | ICD-10-CM | POA: Diagnosis not present

## 2017-02-12 DIAGNOSIS — M25662 Stiffness of left knee, not elsewhere classified: Secondary | ICD-10-CM | POA: Diagnosis not present

## 2017-02-12 DIAGNOSIS — M6281 Muscle weakness (generalized): Secondary | ICD-10-CM | POA: Diagnosis not present

## 2017-03-03 NOTE — Progress Notes (Signed)
Office Visit Note  Patient: Shelly [name]             Date of Birth: [birth year]           MRN: [id number]             PCP: Lanice Shirts, MD Referring: Lanice Shirts, * Visit Date: 03/04/2017 Occupation: '@GUAROCC'$ @    Subjective:  Follow-up   History of Present Illness: Shelly [name] is a 56 y.o. female   Last seen 08/29/2016. Since the last visit, patient is doing well with her inflammatory arthritis. She has not had any flares and has not had any needs for DMARD or Biologics. Currently she is not on any medication except for the occasional NSAID.  No flares, no joint pain swelling and stiffness.  Unfortunately, she gives a history of falling off of a ladder sometime in December 2017 as she was trying to get a item off of the Christmas tree. The fall cause her to have an injury to the left knee. Imaging showed that she had a tibial plateau fracture. Luckily she did not need any surgery. She does have some pain off and on but she is recovering well. She also struck her right knee when she fell and that was painful from time to time but that too has recovered well.  She recently saw Dr. Gardenia Phlegm, D.O., who did manipulations for the patient's hips as well as neck. She reports that the neck manipulation allowed her to not have any more numbness and tingling radiating down to her left arm. The back manipulation also helped her not have back and hip pain. She states that the manipulations give her immediate relief.  She has had warts though this can injections completed to both knees 07/26/2016. She has done well with those injections in the past and we offered her repeat injections if and when she is interested. She would like to wait on those injections for now and will call us when she wants [place] to start the application process and get the prior approval.  Patient is also complaining of a history of left great toe pain which has been diagnosed with CPPD. She  hasn't had any flare currently. She is aware that she can use colchicine if she does have a flare. Her current problem with her left foot is callus formation on the metatarsal head of the third fourth toe area as well as the first toe area. She sees Dr. Rolley Sims for treatment for this.  Activities of Daily Living:  Patient reports morning stiffness for 15 minute.   Patient Denies nocturnal pain.  Difficulty dressing/grooming: Denies Difficulty climbing stairs: Denies Difficulty getting out of chair: Denies Difficulty using hands for taps, buttons, cutlery, and/or writing: Denies   Review of Systems  Constitutional: Negative for fatigue.  HENT: Negative for mouth sores and mouth dryness.   Eyes: Negative for dryness.  Respiratory: Negative for shortness of breath.   Gastrointestinal: Negative for constipation and diarrhea.  Musculoskeletal: Negative for myalgias and myalgias.  Skin: Negative for sensitivity to sunlight.  Psychiatric/Behavioral: Negative for decreased concentration and sleep disturbance.    PMFS History:  Patient Active Problem List   Diagnosis Date Noted  . Inflammatory arthritis 08/28/2016  . Osteoarthritis of both hands 08/28/2016  . Osteoarthritis of both feet 08/28/2016  . Osteoarthritis of both knees 08/28/2016  . Sicca ([place]) 08/28/2016  . Insomnia 08/28/2016  . 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 Vitamin D deficiency 08/28/2016  .  Pseudogout 08/28/2016  . Trapezius muscle spasm 08/28/2016  . Chronic neck pain 03/01/2016  . Scapular dyskinesis 03/01/2016  . Nonallopathic lesion of cervical region 03/01/2016  . Nonallopathic lesion of thoracic region 03/01/2016  . Nonallopathic lesion of lumbosacral region 03/01/2016  . DUB (dysfunctional uterine bleeding) 01/05/2014  . Family history of abdominal aortic aneurysm 08/12/2012  . Renal calculus, left 06/04/2012  . Irritability 05/01/2012  . GERD (gastroesophageal reflux disease) 05/01/2012  . History of  migraine 05/01/2012  . Hypothyroidism 05/01/2012  . History of renal calculi 05/01/2012  . Leukopenia 05/01/2012  . 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 Allergic rhinitis due to allergen 05/01/2012    Past Medical History:  Diagnosis Date  . Chronic kidney disease    kidney stones  . Depression   . Endometriosis   . Fibroid   . Fracture, tibial plateau   . GERD (gastroesophageal reflux disease)   . Heart murmur   . Hypothyroid   . Inflammatory arthritis 08/28/2016  . Insomnia 08/28/2016  . Migraine   . Osteoarthritis of both feet 08/28/2016  . Osteoarthritis of both hands 08/28/2016  . Osteoarthritis of both knees 08/28/2016  . 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 Sicca ([place]) 08/28/2016  . Vitamin D deficiency 08/28/2016    Family History  Problem Relation Age of Onset  . Hypertension Mother   . Hypertension Father   . Stroke Father   . Diabetes Father   . Heart disease Father   . Hypertension Brother   . Hypertension Maternal Grandmother   . Hypertension Maternal Grandfather   . Kidney failure Maternal Grandfather   . Aneurysm Maternal Grandfather   . Cancer Maternal Aunt 42    breast  . Aneurysm Maternal Aunt   . Cancer Cousin     breast   Past Surgical History:  Procedure Laterality Date  . LAPAROSCOPY  1994   endometriosis  . PELVIC LAPAROSCOPY  1993   d/t endometriosis--Dr. Matthew Saras   Social History   Social History Narrative  . No narrative on file     Objective: Vital Signs: BP 124/68   Pulse 68   Resp 14   Ht '5\' 6"'$  (1.676 m)   Wt 154 lb (69.9 kg)   LMP 02/22/2014   BMI 24.86 kg/m    Physical Exam  Constitutional: She is oriented to person, place, and time. She appears well-developed and well-nourished.  HENT:  Head: Normocephalic and atraumatic.  Eyes: EOM are normal. Pupils are equal, round, and reactive to light.  Cardiovascular: Normal rate, regular rhythm and normal heart sounds.  Exam reveals no gallop and no friction rub.   No murmur  heard. Pulmonary/Chest: Effort normal and breath sounds normal. She has no wheezes. She has no rales.  Abdominal: Soft. Bowel sounds are normal. She exhibits no distension. There is no tenderness. There is no guarding. No hernia.  Musculoskeletal: Normal range of motion. She exhibits no edema, tenderness or deformity.  Lymphadenopathy:    She has no cervical adenopathy.  Neurological: She is alert and oriented to person, place, and time. Coordination normal.  Skin: Skin is warm and dry. Capillary refill takes less than 2 seconds. No rash noted.  Psychiatric: She has a normal mood and affect. Her behavior is normal.  Nursing note and vitals reviewed.    Musculoskeletal Exam:  Full range of motion of all joints Grip strength is equal and strong bilaterally Fiber myalgia tender points are absent  CDAI Exam: No CDAI exam completed.  No synovitis on examination  Investigation: No additional findings.  No visits with results within 6 Month(s) from this visit.  Latest known visit with results is:  Office Visit on 01/10/2015  Component Date Value Ref Range Status  . WBC 01/10/2015 3.5* 4.0 - 10.5 K/uL Final  . RBC 01/10/2015 4.45  3.87 - 5.11 MIL/uL Final  . Hemoglobin 01/10/2015 14.0  12.0 - 15.0 g/dL Final  . HCT 01/10/2015 42.3  36.0 - 46.0 % Final  . MCV 01/10/2015 95.1  78.0 - 100.0 fL Final  . MCH 01/10/2015 31.5  26.0 - 34.0 pg Final  . MCHC 01/10/2015 33.1  30.0 - 36.0 g/dL Final  . RDW 01/10/2015 12.6  11.5 - 15.5 % Final  . Platelets 01/10/2015 251  150 - 400 K/uL Final  . MPV 01/10/2015 11.1  8.6 - 12.4 fL Final  . Neutrophils Relative % 01/10/2015 60  43 - 77 % Final  . Neutro Abs 01/10/2015 2.1  1.7 - 7.7 K/uL Final  . Lymphocytes Relative 01/10/2015 30  12 - 46 % Final  . Lymphs Abs 01/10/2015 1.1  0.7 - 4.0 K/uL Final  . Monocytes Relative 01/10/2015 9  3 - 12 % Final  . Monocytes Absolute 01/10/2015 0.3  0.1 - 1.0 K/uL Final  . Eosinophils Relative 01/10/2015 1  0  - 5 % Final  . Eosinophils Absolute 01/10/2015 0.0  0.0 - 0.7 K/uL Final  . Basophils Relative 01/10/2015 0  0 - 1 % Final  . Basophils Absolute 01/10/2015 0.0  0.0 - 0.1 K/uL Final  . Smear Review 01/10/2015 Criteria for review not met   Final  . Sodium 01/10/2015 140  135 - 145 mEq/L Final  . Potassium 01/10/2015 4.5  3.5 - 5.3 mEq/L Final  . Chloride 01/10/2015 107  96 - 112 mEq/L Final  . CO2 01/10/2015 26  19 - 32 mEq/L Final  . Glucose, Bld 01/10/2015 92  70 - 99 mg/dL Final  . BUN 01/10/2015 15  6 - 23 mg/dL Final  . Creat 01/10/2015 0.59  0.50 - 1.10 mg/dL Final  . Total Bilirubin 01/10/2015 0.5  0.2 - 1.2 mg/dL Final  . Alkaline Phosphatase 01/10/2015 96  39 - 117 U/L Final  . AST 01/10/2015 16  0 - 37 U/L Final  . ALT 01/10/2015 19  0 - 35 U/L Final  . Total Protein 01/10/2015 6.9  6.0 - 8.3 g/dL Final  . Albumin 01/10/2015 4.1  3.5 - 5.2 g/dL Final  . Calcium 01/10/2015 9.1  8.4 - 10.5 mg/dL Final  . GFR, Est African American 01/10/2015 >89  mL/min Final  . GFR, Est Non African American 01/10/2015 >89  mL/min Final   Comment:   The estimated GFR is a calculation valid for adults (>=17 years old) that uses the CKD-EPI algorithm to adjust for age and sex. It is   not to be used for children, pregnant women, hospitalized patients,    patients on dialysis, or with rapidly changing kidney function. According to the NKDEP, eGFR >89 is normal, 60-89 shows mild impairment, 30-59 shows moderate impairment, 15-29 shows severe impairment and <15 is ESRD.     Marland Kitchen Cholesterol 01/10/2015 174  0 - 200 mg/dL Final   Comment: ATP III Classification:       < 200        mg/dL        Desirable      200 - 239     mg/dL  Borderline High      >= 240        mg/dL        High     . Triglycerides 01/10/2015 50  <150 mg/dL Final  . HDL 01/10/2015 60  >=46 mg/dL Final  . Total CHOL/HDL Ratio 01/10/2015 2.9  Ratio Final  . VLDL 01/10/2015 10  0 - 40 mg/dL Final  . LDL Cholesterol  01/10/2015 104* 0 - 99 mg/dL Final   Comment:   Total Cholesterol/HDL Ratio:CHD Risk                        Coronary Heart Disease Risk Table                                        Men       Women          1/2 Average Risk              3.4        3.3              Average Risk              5.0        4.4           2X Average Risk              9.6        7.1           3X Average Risk             23.4       11.0 Use the calculated Patient Ratio above and the CHD Risk table  to determine the patient's CHD Risk. ATP III Classification (LDL):       < 100        mg/dL         Optimal      100 - 129     mg/dL         Near or Above Optimal      130 - 159     mg/dL         Borderline High      160 - 189     mg/dL         High       > 190        mg/dL         Very High     . TSH 01/10/2015 1.077  0.350 - 4.500 uIU/mL Final  . Vit D, 25-Hydroxy 01/10/2015 24* 30 - 100 ng/mL Final   Comment: Vitamin D Status           25-OH Vitamin D        Deficiency                <20 ng/mL        Insufficiency         20 - 29 ng/mL        Optimal             > or = 30 ng/mL   For 25-OH Vitamin D testing on patients on D2-supplementation and patients for whom quantitation of D2 and D3 fractions is required, the QuestAssureD 25-OH VIT D, (D2,D3), LC/MS/MS is recommended: order code [id number] (patients > 2 yrs).   Marland Kitchen  Color, UA 01/10/2015 yellow   Final  . Clarity, UA 01/10/2015 clear   Final  . Glucose, UA 01/10/2015 neg   Final  . Bilirubin, UA 01/10/2015 neg   Final  . Ketones, UA 01/10/2015 neg   Final  . Spec Grav, UA 01/10/2015 1.010   Final  . Blood, UA 01/10/2015 neg   Final  . pH, UA 01/10/2015 6.5   Final  . Protein, UA 01/10/2015 neg   Final  . Urobilinogen, UA 01/10/2015 negative   Final  . Nitrite, UA 01/10/2015 neg   Final  . Leukocytes, UA 01/10/2015 Negative   Final  . Fecal Occult Blood, POC 01/10/2015 Negative   Final  . Card #1 Date 01/10/2015 03/12/2015   Final  . CYTOLOGY - PAP  01/10/2015 PAP RESULT   Final  . T3, Free 01/10/2015 3.0  2.3 - 4.2 pg/mL Final  . Free T4 01/10/2015 1.55  0.80 - 1.80 ng/dL Final  . Vitamin B-12 01/10/2015 331  211 - 911 pg/mL Final      Imaging: No results found.  Speciality Comments: No specialty comments available.    Procedures:  No procedures performed Allergies: Compazine [prochlorperazine edisylate]   Assessment / Plan:     Visit Diagnoses: Inflammatory arthritis - 03/04/2017: Not on any DMARD or Biologics for inflammatory arthritis;  - Plan: CBC with Differential/Platelet, COMPLETE METABOLIC PANEL WITH GFR, Ferritin, Magnesium, Iron and TIBC, Cyclic citrul peptide antibody, IgG, 14.3.3 ETA, Rheum. Arthritis  Closed fracture of left tibial plateau, sequela - dec 2017 ==> fell from ladder ; mostly recovered.; some pain off and on.  Baker's cyst of knee, left - dec 2017:  ruptured baker's cyst, slowly healed.  Primary osteoarthritis of both feet  Primary osteoarthritis of both hands  Primary osteoarthritis of both knees - Orthovisc No. 4 completed to both knees 07/26/2016 with excellent results;   Pseudogout - Plan: CBC with Differential/Platelet, COMPLETE METABOLIC PANEL WITH GFR, Ferritin, Magnesium, Iron and TIBC, Cyclic citrul peptide antibody, IgG, 14.3.3 ETA, Rheum. Arthritis  Chronic neck pain  History of migraine  History of renal calculi  Primary insomnia  Leukopenia, unspecified type - Plan: CBC with Differential/Platelet  Sicca, unspecified type ([place])  Nonallopathic lesion of thoracic region  Nonallopathic lesion of lumbosacral region  Nonallopathic lesion of cervical region  Vitamin D deficiency  History of gastroesophageal reflux (GERD)  History of hypothyroidism   Plan: #1: Inflammatory arthritis. Not on any medications (DMARD or Biologics).  #2: High-risk prescription. Not on any medications, DMARD or Biologics.  #3: History of CPPD. No flares. Is aware that she can use  colchicine if she needs to.  #4: History of left foot pain with a new bunion formation. Some callus formation at the bottom of the left foot at the third and fourth metatarsal head as well as the first metatarsal head. Some discomfort with ambulation  #5: [place] and injured her left knee in December 2017 and sustained a tibial plateau fracture. Saw Dr. Cherre Huger office. No surgery was needed for this injury. After the visit, patient also ended up having Baker's cyst formation that eventually ruptured and caused a significant amount of pain to her left lower leg    #6: Bilateral OA of knees. Successfully completed for injections of Orthovisc 4 on 07/26/2016  #7: History of sicca symptoms but no flare.  #8: I offered the patient ultrasound of bilateral hands and wrist. She is not on any DMARD or Biologics and has not had any flare  of her inflammatory arthritis that we would like to monitor and patient is agreeable.  Schedule patient ultrasound bilateral hands and wrist rule out synovitis; history of inflammatory arthritis; no DMARD or Biologics; hx of no synovitis on multiple exams.  #9:  ccp, 14-33-eta, mg, seroferritin, tibc   Orders: Orders Placed This Encounter  Procedures  . CBC with Differential/Platelet  . COMPLETE METABOLIC PANEL WITH GFR  . Ferritin  . Magnesium  . Iron and TIBC  . Cyclic citrul peptide antibody, IgG  . 14.3.3 ETA, Rheum. Arthritis   No orders of the defined types were placed in this encounter.   Face-to-face time spent with patient was 30 minutes. 50% of time was spent in counseling and coordination of care.  Follow-Up Instructions: No Follow-up on file.   Eliezer Lofts, PA-C  Patient gives history of intermittent swelling. She had no synovitis on examination. We will get labs as mentioned above and also will schedule ultrasound of bilateral hands to rule out synovitis. I examined and evaluated the patient with Eliezer Lofts PA. The plan  of care was discussed as noted above.  Bo Merino, MD  Note - This record has been created using Editor, commissioning.  Chart creation errors have been sought, but may not always  have been located. Such creation errors do not reflect on  the standard of medical care.

## 2017-03-04 ENCOUNTER — Ambulatory Visit: Payer: 59 | Admitting: Rheumatology

## 2017-03-04 ENCOUNTER — Encounter: Payer: Self-pay | Admitting: Rheumatology

## 2017-03-04 ENCOUNTER — Ambulatory Visit (INDEPENDENT_AMBULATORY_CARE_PROVIDER_SITE_OTHER): Payer: 59 | Admitting: Rheumatology

## 2017-03-04 VITALS — BP 124/68 | HR 68 | Resp 14 | Ht 66.0 in | Wt 154.0 lb

## 2017-03-04 DIAGNOSIS — M19072 Primary osteoarthritis, left ankle and foot: Secondary | ICD-10-CM

## 2017-03-04 DIAGNOSIS — M7122 Synovial cyst of popliteal space [Baker], left knee: Secondary | ICD-10-CM

## 2017-03-04 DIAGNOSIS — D72819 Decreased white blood cell count, unspecified: Secondary | ICD-10-CM | POA: Diagnosis not present

## 2017-03-04 DIAGNOSIS — M170 Bilateral primary osteoarthritis of knee: Secondary | ICD-10-CM | POA: Diagnosis not present

## 2017-03-04 DIAGNOSIS — M3500 Sicca syndrome, unspecified: Secondary | ICD-10-CM

## 2017-03-04 DIAGNOSIS — E559 Vitamin D deficiency, unspecified: Secondary | ICD-10-CM

## 2017-03-04 DIAGNOSIS — M542 Cervicalgia: Secondary | ICD-10-CM | POA: Diagnosis not present

## 2017-03-04 DIAGNOSIS — S82142S Displaced bicondylar fracture of left tibia, sequela: Secondary | ICD-10-CM

## 2017-03-04 DIAGNOSIS — G8929 Other chronic pain: Secondary | ICD-10-CM

## 2017-03-04 DIAGNOSIS — M19071 Primary osteoarthritis, right ankle and foot: Secondary | ICD-10-CM

## 2017-03-04 DIAGNOSIS — Z87442 Personal history of urinary calculi: Secondary | ICD-10-CM

## 2017-03-04 DIAGNOSIS — M1120 Other chondrocalcinosis, unspecified site: Secondary | ICD-10-CM

## 2017-03-04 DIAGNOSIS — Z8669 Personal history of other diseases of the nervous system and sense organs: Secondary | ICD-10-CM

## 2017-03-04 DIAGNOSIS — M1990 Unspecified osteoarthritis, unspecified site: Secondary | ICD-10-CM

## 2017-03-04 DIAGNOSIS — Z8639 Personal history of other endocrine, nutritional and metabolic disease: Secondary | ICD-10-CM

## 2017-03-04 DIAGNOSIS — M999 Biomechanical lesion, unspecified: Secondary | ICD-10-CM

## 2017-03-04 DIAGNOSIS — M19042 Primary osteoarthritis, left hand: Secondary | ICD-10-CM

## 2017-03-04 DIAGNOSIS — Z8719 Personal history of other diseases of the digestive system: Secondary | ICD-10-CM

## 2017-03-04 DIAGNOSIS — F5101 Primary insomnia: Secondary | ICD-10-CM

## 2017-03-04 DIAGNOSIS — M19041 Primary osteoarthritis, right hand: Secondary | ICD-10-CM

## 2017-03-04 LAB — COMPLETE METABOLIC PANEL WITH GFR
ALT: 16 U/L (ref 6–29)
AST: 15 U/L (ref 10–35)
Albumin: 4.5 g/dL (ref 3.6–5.1)
Alkaline Phosphatase: 94 U/L (ref 33–130)
BUN: 16 mg/dL (ref 7–25)
CALCIUM: 9.2 mg/dL (ref 8.6–10.4)
CO2: 25 mmol/L (ref 20–31)
CREATININE: 0.65 mg/dL (ref 0.50–1.05)
Chloride: 107 mmol/L (ref 98–110)
GFR, Est African American: >89 mL/min (ref >=60)
GFR, Est Non African American: >89 mL/min (ref >=60)
Glucose, Bld: 98 mg/dL (ref 65–99)
Potassium: 4.8 mmol/L (ref 3.5–5.3)
Sodium: 141 mmol/L (ref 135–146)
Total Bilirubin: 0.4 mg/dL (ref 0.2–1.2)
Total Protein: 7.3 g/dL (ref 6.1–8.1)

## 2017-03-04 LAB — CBC WITH DIFFERENTIAL/PLATELET
BASOS ABS: 0 {cells}/uL (ref 0–200)
Basophils Relative: 0 %
EOS ABS: 64 {cells}/uL (ref 15–500)
Eosinophils Relative: 2 %
HEMATOCRIT: 42.5 % (ref 35.0–45.0)
Hemoglobin: 13.7 g/dL (ref 11.7–15.5)
Lymphocytes Relative: 33 %
Lymphs Abs: 1056 cells/uL (ref 850–3900)
MCH: 30.6 pg (ref 27.0–33.0)
MCHC: 32.2 g/dL (ref 32.0–36.0)
MCV: 95.1 fL (ref 80.0–100.0)
MONO ABS: 384 {cells}/uL (ref 200–950)
MPV: 10.8 fL (ref 7.5–12.5)
Monocytes Relative: 12 %
NEUTROS PCT: 53 %
Neutro Abs: 1696 cells/uL (ref 1500–7800)
Platelets: 268 10*3/uL (ref 140–400)
RBC: 4.47 MIL/uL (ref 3.80–5.10)
RDW: 13 % (ref 11.0–15.0)
WBC: 3.2 10*3/uL — ABNORMAL LOW (ref 3.8–10.8)

## 2017-03-04 LAB — IRON AND TIBC
%SAT: 24 % (ref 11–50)
Iron: 80 ug/dL (ref 45–160)
TIBC: 327 ug/dL (ref 250–450)
UIBC: 247 ug/dL (ref 125–400)

## 2017-03-05 ENCOUNTER — Telehealth: Payer: Self-pay | Admitting: *Deleted

## 2017-03-05 LAB — FERRITIN: Ferritin: 199 ng/mL (ref 10–232)

## 2017-03-05 LAB — CYCLIC CITRUL PEPTIDE ANTIBODY, IGG: Cyclic Citrullin Peptide Ab: <16 Units

## 2017-03-05 LAB — MAGNESIUM: Magnesium: 2.2 mg/dL (ref 1.5–2.5)

## 2017-03-05 NOTE — Telephone Encounter (Signed)
Patient declined Euflexxa at this time due to tib plat fx.

## 2017-03-12 ENCOUNTER — Telehealth: Payer: Self-pay | Admitting: Rheumatology

## 2017-03-12 NOTE — Telephone Encounter (Signed)
Patient returning Andrea's call. °

## 2017-03-12 NOTE — Telephone Encounter (Signed)
Patient advised of lab results and verbalized understanding.  

## 2017-03-12 NOTE — Telephone Encounter (Signed)
See previous phone note.  

## 2017-03-12 NOTE — Telephone Encounter (Signed)
Patient returned Andrea's phone call (she did not say what the call was in reference to). Please call patient back.

## 2017-03-12 NOTE — Telephone Encounter (Signed)
Attempted to contact the patient and left message for patient to call the office.  

## 2017-03-31 DIAGNOSIS — D72819 Decreased white blood cell count, unspecified: Secondary | ICD-10-CM | POA: Diagnosis not present

## 2017-03-31 DIAGNOSIS — E559 Vitamin D deficiency, unspecified: Secondary | ICD-10-CM | POA: Diagnosis not present

## 2017-03-31 DIAGNOSIS — E039 Hypothyroidism, unspecified: Secondary | ICD-10-CM | POA: Diagnosis not present

## 2017-03-31 DIAGNOSIS — Z0000 Encounter for general adult medical examination without abnormal findings: Secondary | ICD-10-CM | POA: Diagnosis not present

## 2017-04-07 MED FILL — LEVOTHYROXINE 75 MCG TABLET: 75 | 90 days supply | Qty: 90 | Fill #0

## 2017-04-07 MED FILL — BUPROPION HCL SR 200 MG TAB: 200 | 90 days supply | Qty: 90 | Fill #0

## 2017-04-08 ENCOUNTER — Ambulatory Visit (INDEPENDENT_AMBULATORY_CARE_PROVIDER_SITE_OTHER): Payer: 59 | Admitting: Family Medicine

## 2017-04-08 ENCOUNTER — Encounter: Payer: Self-pay | Admitting: Family Medicine

## 2017-04-08 VITALS — BP 108/80 | HR 67 | Ht 66.0 in | Wt 151.0 lb

## 2017-04-08 DIAGNOSIS — M999 Biomechanical lesion, unspecified: Secondary | ICD-10-CM | POA: Diagnosis not present

## 2017-04-08 DIAGNOSIS — G8929 Other chronic pain: Secondary | ICD-10-CM

## 2017-04-08 DIAGNOSIS — M542 Cervicalgia: Secondary | ICD-10-CM

## 2017-04-08 NOTE — Progress Notes (Signed)
[name] [place], [place] [address] Phone: [phone] Subjective:    I'm seeing this patient by the request  of:    CC: Back and neck pain follow-up  [id number]  Shelly [name] is a 56 y.o. female coming in with complaint of back and neck pain. Has had this multiple times. We have seen her and has responded very well to manipulation. Unable to tolerate iron supplementation but does seem to be some of the difficulty. Patient is been doing icing, home exercises, patient will increase activity as tolerated.     Past Medical History:  Diagnosis Date  . Chronic kidney disease    kidney stones  . Depression   . Endometriosis   . Fibroid   . Fracture, tibial plateau   . GERD (gastroesophageal reflux disease)   . Heart murmur   . Hypothyroid   . Inflammatory arthritis 08/28/2016  . Insomnia 08/28/2016  . Migraine   . Osteoarthritis of both feet 08/28/2016  . Osteoarthritis of both hands 08/28/2016  . Osteoarthritis of both knees 08/28/2016  . 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 Sicca ([place]) 08/28/2016  . Vitamin D deficiency 08/28/2016   Past Surgical History:  Procedure Laterality Date  . LAPAROSCOPY  1994   endometriosis  . PELVIC LAPAROSCOPY  1993   d/t endometriosis--Dr. Matthew Saras   Social History   Social History  . Marital status: Married    Spouse name: N/A  . Number of children: 2  . Years of education: N/A   Occupational History  . RN [employer]   Social History Main Topics  . Smoking status: Never Smoker  . Smokeless tobacco: Never Used  . Alcohol use No  . Drug use: No  . Sexual activity: Yes    Partners: Male    Birth control/ protection: None   Other Topics Concern  . None   Social History Narrative  . None   Allergies  Allergen Reactions  . Compazine [Prochlorperazine Edisylate] Other (See Comments)    Muscle contractions   Family History  Problem Relation Age of Onset  . Hypertension Mother     . Hypertension Father   . Stroke Father   . Diabetes Father   . Heart disease Father   . Hypertension Brother   . Hypertension Maternal Grandmother   . Hypertension Maternal Grandfather   . Kidney failure Maternal Grandfather   . Aneurysm Maternal Grandfather   . Cancer Maternal Aunt 42       breast  . Aneurysm Maternal Aunt   . Cancer Cousin        breast    Past medical history, social, surgical and family history all reviewed in electronic medical record.  No pertanent information unless stated regarding to the chief complaint.   Review of Systems:Review of systems updated and as accurate as of 04/08/17  No headache, visual changes, nausea, vomiting, diarrhea, constipation, dizziness, abdominal pain, skin rash, fevers, chills, night sweats, weight loss, swollen lymph nodes, body aches, joint swelling, muscle aches, chest pain, shortness of breath, mood changes.   Objective  Blood pressure 108/80, pulse 67, height 5\' 6"  (1.676 m), weight 151 lb (68.5 kg), last menstrual period 02/22/2014, SpO2 98 %. Systems examined below as of 04/08/17   General: No apparent distress alert and oriented x3 mood and affect normal, dressed appropriately.  HEENT: Pupils equal, extraocular movements intact  Respiratory: Patient's speak in full sentences and does not appear short of breath  Cardiovascular: No lower extremity edema, non tender, no erythema  Skin: Warm dry intact with no signs of infection or rash on extremities or on axial skeleton.  Abdomen: Soft nontender  Neuro: Cranial nerves II through XII are intact, neurovascularly intact in all extremities with 2+ DTRs and 2+ pulses.  Lymph: No lymphadenopathy of posterior or anterior cervical chain or axillae bilaterally.  Gait normal with good balance and coordination.  MSK:  Non tender with full range of motion and good stability and symmetric strength and tone of shoulders, elbows, wrist, hip, knee and ankles bilaterally.   Neck: Inspection unremarkable. No palpable stepoffs. Negative Spurling's maneuver. Mild tightness in range of motion lacking the last 5 of extension as well as rotation bilaterally Grip strength and sensation normal in bilateral hands Strength good C4 to T1 distribution No sensory change to C4 to T1 Negative Hoffman sign bilaterally Reflexes normal  Back Exam:  Inspection: Unremarkable  Motion: Flexion 45 deg, Extension 25 deg, Side Bending to 45 deg bilaterally,  Rotation to 45 deg bilaterally  SLR laying: Negative  XSLR laying: Negative  Palpable tenderness: Mild tenderness to palpation mostly of the thoracolumbar juncture. FABER: negative. Sensory change: Gross sensation intact to all lumbar and sacral dermatomes.  Reflexes: 2+ at both patellar tendons, 2+ at achilles tendons, Babinski's downgoing.  Strength at foot  Plantar-flexion: 5/5 Dorsi-flexion: 5/5 Eversion: 5/5 Inversion: 5/5  Leg strength  Quad: 5/5 Hamstring: 5/5 Hip flexor: 5/5 Hip abductors: 5/5  Gait unremarkable.  Osteopathic findings C2 flexed rotated and side bent right T3 extended rotated and side bent right inhaled third rib T9 extended rotated and side bent left T12 flexed rotated and side bent right L2 flexed rotated and side bent right Sacrum right on right      Impression and Recommendations:     This case required medical decision making of moderate complexity.      Note: This dictation was prepared with Dragon dictation along with smaller phrase technology. Any transcriptional errors that result from this process are unintentional.

## 2017-04-08 NOTE — Assessment & Plan Note (Signed)
Has chronic neck pain overall. Has been doing relatively well. We discussed icing regimen and home exercises. Patient will continue to be active otherwise. Follow-up again in 6-8 weeks. Does respond well to osteopathic manipulation.

## 2017-04-08 NOTE — Patient Instructions (Signed)
With gardening keep hands  within peripheral vision.  See me again in 8 weeks.  Good luck with vacation

## 2017-04-08 NOTE — Assessment & Plan Note (Signed)
Decision today to treat with OMT was based on Physical Exam  After verbal consent patient was treated with HVLA, ME, FPR techniques in cervical, thoracic, rib lumbar and sacral areas  Patient tolerated the procedure well with improvement in symptoms  Patient given exercises, stretches and lifestyle modifications  See medications in patient instructions if given  Patient will follow up in 6-8 weeks 

## 2017-04-10 NOTE — Progress Notes (Signed)
  H/o inflammatory arthriti H/o CPPD anti CCP-, Ferritin normal, Mg normal Schedule patient ultrasound bilateral hands and wrist rule out synovitis; history of inflammatory arthritis; no DMARD or Biologics; hx of no synovitis on multiple exams.

## 2017-04-14 ENCOUNTER — Inpatient Hospital Stay (INDEPENDENT_AMBULATORY_CARE_PROVIDER_SITE_OTHER): Payer: 59

## 2017-04-14 ENCOUNTER — Ambulatory Visit (INDEPENDENT_AMBULATORY_CARE_PROVIDER_SITE_OTHER): Payer: 59 | Admitting: Rheumatology

## 2017-04-14 DIAGNOSIS — M79642 Pain in left hand: Secondary | ICD-10-CM

## 2017-04-14 DIAGNOSIS — M79641 Pain in right hand: Secondary | ICD-10-CM | POA: Diagnosis not present

## 2017-04-23 ENCOUNTER — Ambulatory Visit: Payer: 59 | Admitting: Rheumatology

## 2017-05-13 DIAGNOSIS — M25562 Pain in left knee: Secondary | ICD-10-CM | POA: Diagnosis not present

## 2017-06-11 ENCOUNTER — Ambulatory Visit: Payer: 59 | Admitting: Family Medicine

## 2017-06-17 ENCOUNTER — Encounter: Payer: Self-pay | Admitting: Family Medicine

## 2017-06-17 ENCOUNTER — Ambulatory Visit (INDEPENDENT_AMBULATORY_CARE_PROVIDER_SITE_OTHER): Payer: 59 | Admitting: Family Medicine

## 2017-06-17 VITALS — BP 140/88 | HR 70 | Ht 66.0 in | Wt 155.0 lb

## 2017-06-17 DIAGNOSIS — M542 Cervicalgia: Secondary | ICD-10-CM

## 2017-06-17 DIAGNOSIS — G8929 Other chronic pain: Secondary | ICD-10-CM | POA: Diagnosis not present

## 2017-06-17 DIAGNOSIS — M999 Biomechanical lesion, unspecified: Secondary | ICD-10-CM

## 2017-06-17 NOTE — Patient Instructions (Addendum)
Anywhere 2-3 months!!!

## 2017-06-17 NOTE — Assessment & Plan Note (Signed)
Stable at the moment. I think patient is doing well. Continue the same regimen. Follow-up again in 2-3 months.

## 2017-06-17 NOTE — Progress Notes (Signed)
[name] [place], [place] [address] Phone: [phone] Subjective:        CC: Back and neck pain follow-up  [id number]  Shelly [name] is a 56 y.o. female coming in with complaint of back and neck pain. Has been doing very well at this time. No significant pain. Making progress. Patient has been having less pain overall. Increasing activity. Happy with the results of far.     Past Medical History:  Diagnosis Date  . Chronic kidney disease    kidney stones  . Depression   . Endometriosis   . Fibroid   . Fracture, tibial plateau   . GERD (gastroesophageal reflux disease)   . Heart murmur   . Hypothyroid   . Inflammatory arthritis 08/28/2016  . Insomnia 08/28/2016  . Migraine   . Osteoarthritis of both feet 08/28/2016  . Osteoarthritis of both hands 08/28/2016  . Osteoarthritis of both knees 08/28/2016  . 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 Sicca ([place]) 08/28/2016  . Vitamin D deficiency 08/28/2016   Past Surgical History:  Procedure Laterality Date  . LAPAROSCOPY  1994   endometriosis  . PELVIC LAPAROSCOPY  1993   d/t endometriosis--Dr. Matthew Saras   Social History   Social History  . Marital status: Married    Spouse name: N/A  . Number of children: 2  . 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 Smoking status: Never Smoker  . Smokeless tobacco: Never Used  . Alcohol use No  . Drug use: No  . Sexual activity: Yes    Partners: Male    Birth control/ protection: None   Other Topics Concern  . None   Social History Narrative  . None   Allergies  Allergen Reactions  . Compazine [Prochlorperazine Edisylate] Other (See Comments)    Muscle contractions   Family History  Problem Relation Age of Onset  . Hypertension Mother   . Hypertension Father   . Stroke Father   . Diabetes Father   . Heart disease Father   . Hypertension Brother   . Hypertension  Maternal Grandmother   . Hypertension Maternal Grandfather   . Kidney failure Maternal Grandfather   . Aneurysm Maternal Grandfather   . Cancer Maternal Aunt 42       breast  . Aneurysm Maternal Aunt   . Cancer Cousin        breast    Past medical history, social, surgical and family history all reviewed in electronic medical record.  No pertanent information unless stated regarding to the chief complaint.   Review of Systems: No headache, visual changes, nausea, vomiting, diarrhea, constipation, dizziness, abdominal pain, skin rash, fevers, chills, night sweats, weight loss, swollen lymph nodes, body aches, joint swelling, muscle aches, chest pain, shortness of breath, mood changes.    Objective  Blood pressure 140/88, pulse 70, height 5\' 6"  (1.676 m), weight 155 lb (70.3 kg), last menstrual period 02/22/2014.   Systems examined below as of 06/17/17 General: NAD A&O x3 mood, affect normal  HEENT: Pupils equal, extraocular movements intact no nystagmus Respiratory: not short of breath at rest or with speaking Cardiovascular: No lower extremity edema, non tender Skin: Warm dry intact with no signs of infection or rash on extremities or on axial skeleton. Abdomen: Soft nontender, no masses Neuro: Cranial nerves  intact, neurovascularly intact in all extremities with 2+ DTRs and 2+ pulses. Lymph:  No lymphadenopathy appreciated today  Gait normal with good balance and coordination.  MSK: Non tender with full range of motion and good stability and symmetric strength and tone of shoulders, elbows, wrist,  knee hips and ankles bilaterally.   Neck: Inspection mild increase in lordosis today. No palpable stepoffs. Negative Spurling's maneuver. Full neck range of motion Grip strength and sensation normal in bilateral hands Strength good C4 to T1 distribution No sensory change to C4 to T1 Negative Hoffman sign bilaterally Reflexes normal  Back Exam:  Inspection: Mild loss of  lordosis Motion: Flexion 45 deg, Extension 45 deg, Side Bending to 45 deg bilaterally,  Rotation to 45 deg bilaterally  SLR laying: Negative  XSLR laying: Negative  Palpable tenderness: None. FABER: negative. Sensory change: Gross sensation intact to all lumbar and sacral dermatomes.  Reflexes: 2+ at both patellar tendons, 2+ at achilles tendons, Babinski's downgoing.  Strength at foot  Plantar-flexion: 5/5 Dorsi-flexion: 5/5 Eversion: 5/5 Inversion: 5/5  Leg strength  Quad: 5/5 Hamstring: 5/5 Hip flexor: 5/5 Hip abductors: 5/5  Gait unremarkable.   Osteopathic findings C2 flexed rotated and side bent right C4 flexed rotated and side bent left C7 flexed rotated and side bent left T3 extended rotated and side bent right inhaled third rib T10 extended rotated and side bent left L2 flexed rotated and side bent right Sacrum right on right       Impression and Recommendations:     This case required medical decision making of moderate complexity.      Note: This dictation was prepared with Dragon dictation along with smaller phrase technology. Any transcriptional errors that result from this process are unintentional.

## 2017-06-17 NOTE — Assessment & Plan Note (Addendum)
Decision today to treat with OMT was based on Physical Exam  After verbal consent patient was treated with HVLA, ME, FPR techniques in cervical, thoracic, lumbar and sacral areas  Patient tolerated the procedure well with improvement in symptoms  Patient given exercises, stretches and lifestyle modifications  See medications in patient instructions if given  Patient will follow up in 8-12 weeks

## 2017-07-03 MED FILL — BUPROPION HCL SR 200 MG TAB: 200 | 90 days supply | Qty: 90 | Fill #1

## 2017-07-03 MED FILL — LEVOTHYROXINE 75 MCG TABLET: 75 | 90 days supply | Qty: 90 | Fill #1

## 2017-07-04 MED FILL — PANTOPRAZOLE SOD DR 40 MG T: 40 | 90 days supply | Qty: 90 | Fill #0

## 2017-07-29 DIAGNOSIS — M3501 Sicca syndrome with keratoconjunctivitis: Secondary | ICD-10-CM | POA: Insufficient documentation

## 2017-07-29 NOTE — Progress Notes (Deleted)
Office Visit Note  Patient: Shelly [name]             Date of Birth: July 29, 1961           MRN: [id number]             PCP: Lanice Shirts, MD Referring: Lanice Shirts, * Visit Date: 08/06/2017 Occupation: @GUAROCC @    Subjective:  No chief complaint on file.   History of Present Illness: [name] is a 56 y.o. female ***   Activities of Daily Living:  Patient reports morning stiffness for *** {minute/hour:19697}.   Patient {ACTIONS;DENIES/REPORTS:21021675::"Denies"} nocturnal pain.  Difficulty dressing/grooming: {ACTIONS;DENIES/REPORTS:21021675::"Denies"} Difficulty climbing stairs: {ACTIONS;DENIES/REPORTS:21021675::"Denies"} Difficulty getting out of chair: {ACTIONS;DENIES/REPORTS:21021675::"Denies"} Difficulty using hands for taps, buttons, cutlery, and/or writing: {ACTIONS;DENIES/REPORTS:21021675::"Denies"}   No Rheumatology ROS completed.   PMFS History:  Patient Active Problem List   Diagnosis Date Noted  . 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 Inflammatory arthritis 08/28/2016  . Osteoarthritis of both hands 08/28/2016  . Osteoarthritis of both feet 08/28/2016  . Osteoarthritis of both knees 08/28/2016  . Insomnia 08/28/2016  . 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 Vitamin D deficiency 08/28/2016  . Pseudogout 08/28/2016  . Trapezius muscle spasm 08/28/2016  . Chronic neck pain 03/01/2016  . Scapular dyskinesis 03/01/2016  . Nonallopathic lesion of cervical region 03/01/2016  . Nonallopathic lesion of thoracic region 03/01/2016  . Nonallopathic lesion of lumbosacral region 03/01/2016  . DUB (dysfunctional uterine bleeding) 01/05/2014  . Family history of abdominal aortic aneurysm 08/12/2012  . Renal calculus, left 06/04/2012  . Irritability 05/01/2012  . GERD (gastroesophageal reflux disease) 05/01/2012  . History of migraine 05/01/2012  . Hypothyroidism 05/01/2012  . History of renal calculi 05/01/2012  . Leukopenia  05/01/2012  . 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 Allergic rhinitis due to allergen 05/01/2012    Past Medical History:  Diagnosis Date  . Chronic kidney disease    kidney stones  . Depression   . Endometriosis   . Fibroid   . Fracture, tibial plateau   . GERD (gastroesophageal reflux disease)   . Heart murmur   . Hypothyroid   . Inflammatory arthritis 08/28/2016  . Insomnia 08/28/2016  . Migraine   . Osteoarthritis of both feet 08/28/2016  . Osteoarthritis of both hands 08/28/2016  . Osteoarthritis of both knees 08/28/2016  . 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 Sicca ([place]) 08/28/2016  . Vitamin D deficiency 08/28/2016    Family History  Problem Relation Age of Onset  . Hypertension Mother   . Hypertension Father   . Stroke Father   . Diabetes Father   . Heart disease Father   . Hypertension Brother   . Hypertension Maternal Grandmother   . Hypertension Maternal Grandfather   . Kidney failure Maternal Grandfather   . Aneurysm Maternal Grandfather   . Cancer Maternal Aunt 42       breast  . Aneurysm Maternal Aunt   . Cancer Cousin        breast   Past Surgical History:  Procedure Laterality Date  . LAPAROSCOPY  1994   endometriosis  . PELVIC LAPAROSCOPY  1993   d/t endometriosis--Dr. Matthew Saras   Social History   Social History Narrative  . No narrative on file     Objective: Vital Signs: LMP 02/22/2014    Physical Exam   Musculoskeletal Exam: ***  CDAI Exam: No CDAI exam completed.    Investigation: No additional findings. CBC Latest Ref Rng &  Units 03/04/2017 01/10/2015 01/05/2014  WBC 3.8 - 10.8 K/uL 3.2(L) 3.5(L) 3.7(L)  Hemoglobin 11.7 - 15.5 g/dL 13.7 14.0 13.4  Hematocrit 35.0 - 45.0 % 42.5 42.3 40.2  Platelets 140 - 400 K/uL 268 251 261   CMP Latest Ref Rng & Units 03/04/2017 01/10/2015 01/05/2014  Glucose 65 - 99 mg/dL 98 92 87  BUN 7 - 25 mg/dL 16 15 18   Creatinine 0.50 - 1.05 mg/dL 0.65 0.59 0.56  Sodium 135 - 146 mmol/L 141 140 137    Potassium 3.5 - 5.3 mmol/L 4.8 4.5 4.5  Chloride 98 - 110 mmol/L 107 107 107  CO2 20 - 31 mmol/L 25 26 20   Calcium 8.6 - 10.4 mg/dL 9.2 9.1 8.5  Total Protein 6.1 - 8.1 g/dL 7.3 6.9 6.9  Total Bilirubin 0.2 - 1.2 mg/dL 0.4 0.5 0.3  Alkaline Phos 33 - 130 U/L 94 96 65  AST 10 - 35 U/L 15 16 15   ALT 6 - 29 U/L 16 19 22      Imaging: No results found.  Speciality Comments: No specialty comments available.    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  No orders of the defined types were placed in this encounter.   Face-to-face time spent with patient was *** minutes. 50% of time was spent in counseling and coordination of care.  Follow-Up Instructions: No Follow-up on file.    , RT  Note - This record has been created using [employer].  Chart creation errors have been sought, but may not always  have been located. Such creation errors do not reflect on  the standard of medical care.

## 2017-08-06 ENCOUNTER — Ambulatory Visit: Payer: 59 | Admitting: Rheumatology

## 2017-08-12 ENCOUNTER — Encounter: Payer: Self-pay | Admitting: Family Medicine

## 2017-08-12 ENCOUNTER — Ambulatory Visit (INDEPENDENT_AMBULATORY_CARE_PROVIDER_SITE_OTHER): Payer: 59 | Admitting: Family Medicine

## 2017-08-12 VITALS — BP 122/86 | HR 79 | Ht 66.0 in | Wt 156.0 lb

## 2017-08-12 DIAGNOSIS — G8929 Other chronic pain: Secondary | ICD-10-CM

## 2017-08-12 DIAGNOSIS — M999 Biomechanical lesion, unspecified: Secondary | ICD-10-CM

## 2017-08-12 DIAGNOSIS — M542 Cervicalgia: Secondary | ICD-10-CM | POA: Diagnosis not present

## 2017-08-12 NOTE — Assessment & Plan Note (Signed)
Decision today to treat with OMT was based on Physical Exam  After verbal consent patient was treated with HVLA, ME, FPR techniques in cervical, thoracic, lumbar and sacral areas  Patient tolerated the procedure well with improvement in symptoms  Patient given exercises, stretches and lifestyle modifications  See medications in patient instructions if given  Patient will follow up in 8-12 weeks

## 2017-08-12 NOTE — Assessment & Plan Note (Signed)
Still believe that likely patient's chronic neck pain is more secondary to patient's poor posture and muscle imbalances. Still responds fairly well to osteopathic manipulation. We discussed icing regimen and home exercises. Encourage patient to work on more of the ergonomics as much as possible. Has topical anti-inflammatories for any breakthrough pain. Declined any type of muscle relaxer. Follow-up again in 2-3 months

## 2017-08-12 NOTE — Patient Instructions (Signed)
Overall not bad Keep hands in peripheral vision  See me again in 2-3 months!

## 2017-08-12 NOTE — Progress Notes (Signed)
[name] [place], [place] [address] Phone: [phone] Subjective:    I'm seeing this patient by the request  of:    CC: Neck pain  [id number]  Shelly [name] is a 56 y.o. female coming in for follow up for back pain. She is her for an adjustment today. Patient has had neck and back pain and has responded well to manipulation previously. Patient denies any radiation of pain. Patient though is unfortunately having some increasing tightness with patient's pinning recently. Doing a lot of overhead activity. Feels more tightness around the neck in the upper back. No radiation to the arms and numbness. Lower back is moderately tight    Past Medical History:  Diagnosis Date  . Chronic kidney disease    kidney stones  . Depression   . Endometriosis   . Fibroid   . Fracture, tibial plateau   . GERD (gastroesophageal reflux disease)   . Heart murmur   . Hypothyroid   . Inflammatory arthritis 08/28/2016  . Insomnia 08/28/2016  . Migraine   . Osteoarthritis of both feet 08/28/2016  . Osteoarthritis of both hands 08/28/2016  . Osteoarthritis of both knees 08/28/2016  . 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 Sicca ([place]) 08/28/2016  . Vitamin D deficiency 08/28/2016   Past Surgical History:  Procedure Laterality Date  . LAPAROSCOPY  1994   endometriosis  . PELVIC LAPAROSCOPY  1993   d/t endometriosis--Dr. Matthew Saras   Social History   Social History  . Marital status: Married    Spouse name: N/A  . Number of children: 2  . Years of education: N/A   Occupational History  . RN [employer]   Social History Main Topics  . Smoking status: Never Smoker  . Smokeless tobacco: Never Used  . Alcohol use No  . Drug use: No  . Sexual activity: Yes    Partners: Male    Birth control/ protection: None   Other Topics Concern  . None   Social History Narrative  . None   Allergies  Allergen Reactions  . Compazine [Prochlorperazine Edisylate]  Other (See Comments)    Muscle contractions   Family History  Problem Relation Age of Onset  . Hypertension Mother   . Hypertension Father   . Stroke Father   . Diabetes Father   . Heart disease Father   . Hypertension Brother   . Hypertension Maternal Grandmother   . Hypertension Maternal Grandfather   . Kidney failure Maternal Grandfather   . Aneurysm Maternal Grandfather   . Cancer Maternal Aunt 42       breast  . Aneurysm Maternal Aunt   . Cancer Cousin        breast     Past medical history, social, surgical and family history all reviewed in electronic medical record.  No pertanent information unless stated regarding to the chief complaint.   Review of Systems:Review of systems updated and as accurate as of 08/12/17  No visual changes, nausea, vomiting, diarrhea, constipation, dizziness, abdominal pain, skin rash, fevers, chills, night sweats, weight loss, swollen lymph nodes, body aches, joint swelling,chest pain, shortness of breath, mood changes. Positive muscle aches and headaches  Objective  Blood pressure 122/86, pulse 79, height 5\' 6"  (1.676 m), weight 156 lb (70.8 kg), last menstrual period 02/22/2014, SpO2 99 %. Systems examined below as of 08/12/17   General: No apparent distress alert and oriented x3 mood and affect normal, dressed appropriately.  HEENT: Pupils  equal, extraocular movements intact  Respiratory: Patient's speak in full sentences and does not appear short of breath  Cardiovascular: No lower extremity edema, non tender, no erythema  Skin: Warm dry intact with no signs of infection or rash on extremities or on axial skeleton.  Abdomen: Soft nontender  Neuro: Cranial nerves II through XII are intact, neurovascularly intact in all extremities with 2+ DTRs and 2+ pulses.  Lymph: No lymphadenopathy of posterior or anterior cervical chain or axillae bilaterally.  Gait normal with good balance and coordination.  MSK:  Non tender with full range of  motion and good stability and symmetric strength and tone of shoulders, elbows, wrist, hip, knee and ankles bilaterally.  Neck: Inspection mild increasing kyphosis of the upper thoracic spine. No palpable stepoffs. Negative Spurling's maneuver. Mild limitation with right-sided rotation and left-sided side bending Grip strength and sensation normal in bilateral hands Strength good C4 to T1 distribution No sensory change to C4 to T1 Negative Hoffman sign bilaterally Reflexes normal  Osteopathic findings C2 flexed rotated and side bent right C4 flexed rotated and side bent left C7 flexed rotated and side bent left T3 extended rotated and side bent right inhaled third rib T9 extended rotated and side bent left L3 flexed rotated and side bent right Sacrum right on right    Impression and Recommendations:     This case required medical decision making of moderate complexity.      Note: This dictation was prepared with Dragon dictation along with smaller phrase technology. Any transcriptional errors that result from this process are unintentional.

## 2017-08-24 NOTE — Progress Notes (Signed)
Office Visit Note  Patient: Shelly [name]             Date of Birth: 04-08-1961           MRN: [id number]             PCP: Lanice Shirts, MD Referring: Lanice Shirts, * Visit Date: 09/04/2017 Occupation: @GUAROCC @    Subjective:  Arthritis (doing well overall, neck/back pain)   History of Present Illness: Shelly [name] is a 56 y.o. female with history of osteoarthritis some. She states she continues to have some discomfort in her joints. She's been painting her C-spine at the house which is triggered some discomfort in her neck and lower back. She denies any joint swelling. She continues to have some problems with dry eyes. She is not using Restasis anymore.  Activities of Daily Living:  Patient reports morning stiffness for 0 minute.   Patient Denies nocturnal pain.  Difficulty dressing/grooming: Denies Difficulty climbing stairs: Denies Difficulty getting out of chair: Denies Difficulty using hands for taps, buttons, cutlery, and/or writing: Denies   Review of Systems  Constitutional: Negative for fatigue, night sweats, weight gain, weight loss and weakness.  HENT: Negative for mouth sores, trouble swallowing, trouble swallowing, mouth dryness and nose dryness.   Eyes: Positive for dryness. Negative for pain, redness and visual disturbance.  Respiratory: Negative for cough, shortness of breath and difficulty breathing.   Cardiovascular: Negative for chest pain, palpitations, hypertension, irregular heartbeat and swelling in legs/feet.  Gastrointestinal: Negative for blood in stool, constipation and diarrhea.  Endocrine: Negative for increased urination.  Genitourinary: Negative for vaginal dryness.  Musculoskeletal: Positive for arthralgias and joint pain. Negative for joint swelling, myalgias, muscle weakness, morning stiffness, muscle tenderness and myalgias.  Skin: Negative for color change, rash, hair loss, skin tightness, ulcers and sensitivity to  sunlight.  Allergic/Immunologic: Negative for susceptible to infections.  Neurological: Negative for dizziness, memory loss and night sweats.  Hematological: Negative for swollen glands.  Psychiatric/Behavioral: Negative for depressed mood and sleep disturbance. The patient is not nervous/anxious.     PMFS History:  Patient Active Problem List   Diagnosis Date Noted  . 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 Osteoarthritis of both hands 08/28/2016  . Osteoarthritis of both feet 08/28/2016  . Osteoarthritis of both knees 08/28/2016  . Insomnia 08/28/2016  . 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 Vitamin D deficiency 08/28/2016  . Trapezius muscle spasm 08/28/2016  . Chronic neck pain 03/01/2016  . Scapular dyskinesis 03/01/2016  . Nonallopathic lesion of cervical region 03/01/2016  . Nonallopathic lesion of thoracic region 03/01/2016  . Nonallopathic lesion of lumbosacral region 03/01/2016  . DUB (dysfunctional uterine bleeding) 01/05/2014  . Family history of abdominal aortic aneurysm 08/12/2012  . Renal calculus, left 06/04/2012  . Irritability 05/01/2012  . GERD (gastroesophageal reflux disease) 05/01/2012  . History of migraine 05/01/2012  . Hypothyroidism 05/01/2012  . History of renal calculi 05/01/2012  . Leukopenia 05/01/2012  . 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 Allergic rhinitis due to allergen 05/01/2012    Past Medical History:  Diagnosis Date  . Chronic kidney disease    kidney stones  . Depression   . Endometriosis   . Fibroid   . Fracture, tibial plateau   . GERD (gastroesophageal reflux disease)   . Heart murmur   . Hypothyroid   . Inflammatory arthritis 08/28/2016  . Insomnia 08/28/2016  . Migraine   . Osteoarthritis of both feet 08/28/2016  .  Osteoarthritis of both hands 08/28/2016  . Osteoarthritis of both knees 08/28/2016  . 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 Sicca ([place]) 08/28/2016  . Vitamin D deficiency 08/28/2016    Family  History  Problem Relation Age of Onset  . Hypertension Mother   . Dementia Mother   . Hypertension Father   . Stroke Father   . Heart disease Father   . Hypertension Brother   . Hypertension Maternal Grandmother   . Hypertension Maternal Grandfather   . Kidney failure Maternal Grandfather   . Aneurysm Maternal Grandfather   . Cancer Maternal Aunt 42       breast  . Aneurysm Maternal Aunt   . Cancer Cousin        breast   Past Surgical History:  Procedure Laterality Date  . LAPAROSCOPY  1994   endometriosis  . PELVIC LAPAROSCOPY  1993   d/t endometriosis--Dr. Matthew Saras   Social History   Social History Narrative  . Not on file     Objective: Vital Signs: BP 133/76 (BP Location: Left Arm, Patient Position: Sitting, Cuff Size: Normal)   Pulse 72   Resp 15   Ht 5\' 6"  (1.676 m)   Wt 165 lb (74.8 kg)   LMP 02/22/2014   BMI 26.63 kg/m    Physical Exam  Constitutional: She is oriented to person, place, and time. She appears well-developed and well-nourished.  HENT:  Head: Normocephalic and atraumatic.  Eyes: Conjunctivae and EOM are normal.  Neck: Normal range of motion.  Cardiovascular: Normal rate, regular rhythm, normal heart sounds and intact distal pulses.  Pulmonary/Chest: Effort normal and breath sounds normal.  Abdominal: Soft. Bowel sounds are normal.  Lymphadenopathy:    She has no cervical adenopathy.  Neurological: She is alert and oriented to person, place, and time.  Skin: Skin is warm and dry. Capillary refill takes less than 2 seconds.  Psychiatric: She has a normal mood and affect. Her behavior is normal.  Nursing note and vitals reviewed.    Musculoskeletal Exam: C-spine and thoracic lumbar spine good range of motion. Shoulder joints although joints wrist joints MCPs were good range of motion. She has some DIP PIP thickening in her hands and her feet consistent with osteoarthritis. She is some crepitus in her knee joints. No warmth swelling or  effusion in any of her joints was noted.  CDAI Exam: No CDAI exam completed.    Investigation: No additional findings. CBC Latest Ref Rng & Units 03/04/2017 01/10/2015 01/05/2014  WBC 3.8 - 10.8 K/uL 3.2(L) 3.5(L) 3.7(L)  Hemoglobin 11.7 - 15.5 g/dL 13.7 14.0 13.4  Hematocrit 35.0 - 45.0 % 42.5 42.3 40.2  Platelets 140 - 400 K/uL 268 251 261   CMP Latest Ref Rng & Units 03/04/2017 01/10/2015 01/05/2014  Glucose 65 - 99 mg/dL 98 92 87  BUN 7 - 25 mg/dL 16 15 18   Creatinine 0.50 - 1.05 mg/dL 0.65 0.59 0.56  Sodium 135 - 146 mmol/L 141 140 137  Potassium 3.5 - 5.3 mmol/L 4.8 4.5 4.5  Chloride 98 - 110 mmol/L 107 107 107  CO2 20 - 31 mmol/L 25 26 20   Calcium 8.6 - 10.4 mg/dL 9.2 9.1 8.5  Total Protein 6.1 - 8.1 g/dL 7.3 6.9 6.9  Total Bilirubin 0.2 - 1.2 mg/dL 0.4 0.5 0.3  Alkaline Phos 33 - 130 U/L 94 96 65  AST 10 - 35 U/L 15 16 15   ALT 6 - 29 U/L 16 19 22     Imaging: No results  found.  Speciality Comments: No specialty comments available.    Procedures:  No procedures performed Allergies: Compazine [prochlorperazine edisylate]   Assessment / Plan:     Visit Diagnoses: Primary osteoarthritis of both hands: She continues to have some pain and stiffness. Muscle strengthening exercises were demonstrated and discussed.  Primary osteoarthritis of both feet: Proper fitting shoes were discussed.  Primary osteoarthritis of both knees: She's not having much discomfort in her knee joints and she lost weight.she uses [place] on when necessary basis.  Cubital tunnel syndrome, bilateral: She has intermittent pain in her elbows.  Neck and lower back pain: Patient reports some discomfort in the neck and lower back  Since she has painted ceiling in her house. It is gradually improving. She's taking over-the-counter NSAIDs. Have given her a handout for neck and back exercises.  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  Other medical problems are listed as follows:  Other insomnia  History of gastroesophageal reflux (GERD)  History of hypothyroidism  History of vitamin D deficiency  History of renal calculi  History of migraine    Orders: No orders of the defined types were placed in this encounter.  No orders of the defined types were placed in this encounter.     Follow-Up Instructions: Return in about 1 year (around 09/04/2018) for Osteoarthritis, sicca.   Bo Merino, MD  Note - This record has been created using Editor, commissioning.  Chart creation errors have been sought, but may not always  have been located. Such creation errors do not reflect on  the standard of medical care.

## 2017-09-04 ENCOUNTER — Ambulatory Visit: Payer: 59 | Admitting: Rheumatology

## 2017-09-04 ENCOUNTER — Encounter: Payer: Self-pay | Admitting: Rheumatology

## 2017-09-04 VITALS — BP 133/76 | HR 72 | Resp 15 | Ht 66.0 in | Wt 165.0 lb

## 2017-09-04 DIAGNOSIS — M545 Low back pain, unspecified: Secondary | ICD-10-CM

## 2017-09-04 DIAGNOSIS — G4709 Other insomnia: Secondary | ICD-10-CM | POA: Diagnosis not present

## 2017-09-04 DIAGNOSIS — G5623 Lesion of ulnar nerve, bilateral upper limbs: Secondary | ICD-10-CM

## 2017-09-04 DIAGNOSIS — Z8639 Personal history of other endocrine, nutritional and metabolic disease: Secondary | ICD-10-CM | POA: Diagnosis not present

## 2017-09-04 DIAGNOSIS — Z8669 Personal history of other diseases of the nervous system and sense organs: Secondary | ICD-10-CM | POA: Diagnosis not present

## 2017-09-04 DIAGNOSIS — M19072 Primary osteoarthritis, left ankle and foot: Secondary | ICD-10-CM

## 2017-09-04 DIAGNOSIS — Z8719 Personal history of other diseases of the digestive system: Secondary | ICD-10-CM

## 2017-09-04 DIAGNOSIS — H16229 Keratoconjunctivitis sicca, not specified as Sjogren's, unspecified eye: Secondary | ICD-10-CM

## 2017-09-04 DIAGNOSIS — M19042 Primary osteoarthritis, left hand: Secondary | ICD-10-CM

## 2017-09-04 DIAGNOSIS — M19071 Primary osteoarthritis, right ankle and foot: Secondary | ICD-10-CM

## 2017-09-04 DIAGNOSIS — M170 Bilateral primary osteoarthritis of knee: Secondary | ICD-10-CM | POA: Diagnosis not present

## 2017-09-04 DIAGNOSIS — M542 Cervicalgia: Secondary | ICD-10-CM

## 2017-09-04 DIAGNOSIS — M19041 Primary osteoarthritis, right hand: Secondary | ICD-10-CM

## 2017-09-04 DIAGNOSIS — M3501 Sicca syndrome with keratoconjunctivitis: Secondary | ICD-10-CM

## 2017-09-04 DIAGNOSIS — Z87442 Personal history of urinary calculi: Secondary | ICD-10-CM

## 2017-09-04 NOTE — Patient Instructions (Signed)
Back Exercises The following exercises strengthen the muscles that help to support the back. They also help to keep the lower back flexible. Doing these exercises can help to prevent back pain or lessen existing pain. If you have back pain or discomfort, try doing these exercises 2-3 times each day or as told by your health care provider. When the pain goes away, do them once each day, but increase the number of times that you repeat the steps for each exercise (do more repetitions). If you do not have back pain or discomfort, do these exercises once each day or as told by your health care provider. Exercises Single Knee to Chest  Repeat these steps 3-5 times for each leg: 1. Lie on your back on a firm bed or the floor with your legs extended. 2. Bring one knee to your chest. Your other leg should stay extended and in contact with the floor. 3. Hold your knee in place by grabbing your knee or thigh. 4. Pull on your knee until you feel a gentle stretch in your lower back. 5. Hold the stretch for 10-30 seconds. 6. Slowly release and straighten your leg.  Pelvic Tilt  Repeat these steps 5-10 times: 1. Lie on your back on a firm bed or the floor with your legs extended. 2. Bend your knees so they are pointing toward the ceiling and your feet are flat on the floor. 3. Tighten your lower abdominal muscles to press your lower back against the floor. This motion will tilt your pelvis so your tailbone points up toward the ceiling instead of pointing to your feet or the floor. 4. With gentle tension and even breathing, hold this position for 5-10 seconds.  Cat-Cow  Repeat these steps until your lower back becomes more flexible: 1. Get into a hands-and-knees position on a firm surface. Keep your hands under your shoulders, and keep your knees under your hips. You may place padding under your knees for comfort. 2. Let your head hang down, and point your tailbone toward the floor so your lower back  becomes rounded like the back of a cat. 3. Hold this position for 5 seconds. 4. Slowly lift your head and point your tailbone up toward the ceiling so your back forms a sagging arch like the back of a cow. 5. Hold this position for 5 seconds.  Press-Ups  Repeat these steps 5-10 times: 1. Lie on your abdomen (face-down) on the floor. 2. Place your palms near your head, about shoulder-width apart. 3. While you keep your back as relaxed as possible and keep your hips on the floor, slowly straighten your arms to raise the top half of your body and lift your shoulders. Do not use your back muscles to raise your upper torso. You may adjust the placement of your hands to make yourself more comfortable. 4. Hold this position for 5 seconds while you keep your back relaxed. 5. Slowly return to lying flat on the floor.  Bridges  Repeat these steps 10 times: 1. Lie on your back on a firm surface. 2. Bend your knees so they are pointing toward the ceiling and your feet are flat on the floor. 3. Tighten your buttocks muscles and lift your buttocks off of the floor until your waist is at almost the same height as your knees. You should feel the muscles working in your buttocks and the back of your thighs. If you do not feel these muscles, slide your feet 1-2 inches farther away   off of the floor until your waist is at almost the same height as your knees. You should feel the muscles working in your buttocks and the back of your thighs. If you do not feel these muscles, slide your feet 1-2 inches farther away from your buttocks.  4. Hold this position for 3-5 seconds.  5. Slowly lower your hips to the starting position, and allow your buttocks muscles to relax completely.    If this exercise is too easy, try doing it with your arms crossed over your chest.  Abdominal Crunches    Repeat these steps 5-10 times:  1. Lie on your back on a firm bed or the floor with your legs extended.  2. Bend your knees so they are pointing toward the ceiling and your feet are flat on the floor.  3. Cross your arms over your chest.  4. Tip your chin slightly toward your chest without bending your neck.  5. Tighten your abdominal muscles and slowly raise your  trunk (torso) high enough to lift your shoulder blades a tiny bit off of the floor. Avoid raising your torso higher than that, because it can put too much stress on your low back and it does not help to strengthen your abdominal muscles.  6. Slowly return to your starting position.    Back Lifts  Repeat these steps 5-10 times:  1. Lie on your abdomen (face-down) with your arms at your sides, and rest your forehead on the floor.  2. Tighten the muscles in your legs and your buttocks.  3. Slowly lift your chest off of the floor while you keep your hips pressed to the floor. Keep the back of your head in line with the curve in your back. Your eyes should be looking at the floor.  4. Hold this position for 3-5 seconds.  5. Slowly return to your starting position.    Contact a health care provider if:   Your back pain or discomfort gets much worse when you do an exercise.   Your back pain or discomfort does not lessen within 2 hours after you exercise.  If you have any of these problems, stop doing these exercises right away. Do not do them again unless your health care provider says that you can.  Get help right away if:   You develop sudden, severe back pain. If this happens, stop doing the exercises right away. Do not do them again unless your health care provider says that you can.  This information is not intended to replace advice given to you by your health care provider. Make sure you discuss any questions you have with your health care provider.  Document Released: 11/21/2004 Document Revised: 02/21/2016 Document Reviewed: 12/08/2014  Elsevier Interactive Patient Education  2017 [employer]    Cervical Strain and Sprain Rehab  Ask your health care provider which exercises are safe for you. Do exercises exactly as told by your health care provider and adjust them as directed. It is normal to feel mild stretching, pulling, tightness, or discomfort as you do these exercises, but you should stop right away if  you feel sudden pain or your pain gets worse.Do not begin these exercises until told by your health care provider.  Stretching and range of motion exercises  These exercises warm up your muscles and joints and improve the movement and flexibility of your neck. These exercises also help to relieve pain, numbness, and tingling.  Exercise A: Cervical side bend      7. Using good posture, sit on a stable chair or stand up.  8. Without moving your shoulders, slowly tilt your left / right ear to your shoulder until you feel a stretch in your neck muscles. You should be looking straight ahead.  9. Hold for __________ seconds.  10. Repeat with the other side of your neck.  Repeat __________ times. Complete this exercise __________ times a day.  Exercise B: Cervical rotation    1. Using good posture, sit on a stable chair or stand up.  2. Slowly turn your head to the side as if you are looking over your left / right shoulder.  ? Keep your eyes level with the ground.  ? Stop when you feel a stretch along the side and the back of your neck.  3. Hold for __________ seconds.  4. Repeat this by turning to your other side.  Repeat __________ times. Complete this exercise __________ times a day.  Exercise C: Thoracic extension and pectoral stretch  6. Roll a towel or a small blanket so it is about 4 inches (10 cm) in diameter.  7. Lie down on your back on a firm surface.  8. Put the towel lengthwise, under your spine in the middle of your back. It should not be not under your shoulder blades. The towel should line up with your spine from your middle back to your lower back.  9. Put your hands behind your head and let your elbows fall out to your sides.  10. Hold for __________ seconds.  Repeat __________ times. Complete this exercise __________ times a day.  Strengthening exercises  These exercises build strength and endurance in your neck. Endurance is the ability to use your muscles for a long time, even after your muscles get  tired.  Exercise D: Upper cervical flexion, isometric  6. Lie on your back with a thin pillow behind your head and a small rolled-up towel under your neck.  7. Gently tuck your chin toward your chest and nod your head down to look toward your feet. Do not lift your head off the pillow.  8. Hold for __________ seconds.  9. Release the tension slowly. Relax your neck muscles completely before you repeat this exercise.  Repeat __________ times. Complete this exercise __________ times a day.  Exercise E: Cervical extension, isometric    6. Stand about 6 inches (15 cm) away from a wall, with your back facing the wall.  7. Place a soft object, about 6-8 inches (15-20 cm) in diameter, between the back of your head and the wall. A soft object could be a small pillow, a ball, or a folded towel.  8. Gently tilt your head back and press into the soft object. Keep your jaw and forehead relaxed.  9. Hold for __________ seconds.  10. Release the tension slowly. Relax your neck muscles completely before you repeat this exercise.  Repeat __________ times. Complete this exercise __________ times a day.  Posture and body mechanics    Body mechanics refers to the movements and positions of your body while you do your daily activities. Posture is part of body mechanics. Good posture and healthy body mechanics can help to relieve stress in your body's tissues and joints. Good posture means that your spine is in its natural S-curve position (your spine is neutral), your shoulders are pulled back slightly, and your head is not tipped forward. The following are general guidelines for applying improved posture and body mechanics to your   everyday activities.  Standing   When standing, keep your spine neutral and keep your feet about hip-width apart. Keep a slight bend in your knees. Your ears, shoulders, and hips should line up.   When you do a task in which you stand in one place for a long time, place one foot up on a stable object that  is 2-4 inches (5-10 cm) high, such as a footstool. This helps keep your spine neutral.  Sitting     When sitting, keep your spine neutral and your keep feet flat on the floor. Use a footrest, if necessary, and keep your thighs parallel to the floor. Avoid rounding your shoulders, and avoid tilting your head forward.   When working at a desk or a computer, keep your desk at a height where your hands are slightly lower than your elbows. Slide your chair under your desk so you are close enough to maintain good posture.   When working at a computer, place your monitor at a height where you are looking straight ahead and you do not have to tilt your head forward or downward to look at the screen.  Resting  When lying down and resting, avoid positions that are most painful for you. Try to support your neck in a neutral position. You can use a contour pillow or a small rolled-up towel. Your pillow should support your neck but not push on it.  This information is not intended to replace advice given to you by your health care provider. Make sure you discuss any questions you have with your health care provider.  Document Released: 10/14/2005 Document Revised: 06/20/2016 Document Reviewed: 09/20/2015  Elsevier Interactive Patient Education  2018 [employer]

## 2017-10-08 ENCOUNTER — Other Ambulatory Visit: Payer: Self-pay | Admitting: Rheumatology

## 2017-10-08 MED FILL — BACLOFEN 10 MG TABS: 10 | 30 days supply | Qty: 90 | Fill #0

## 2017-10-08 MED FILL — PANTOPRAZOLE SOD DR 40 MG T: 40 | 90 days supply | Qty: 90 | Fill #1

## 2017-10-08 MED FILL — LEVOTHYROXINE 75 MCG TABLET: 75 | 90 days supply | Qty: 90 | Fill #2

## 2017-10-08 MED FILL — BUPROPION HCL SR 200 MG TAB: 200 | 90 days supply | Qty: 90 | Fill #2

## 2017-10-08 NOTE — Telephone Encounter (Signed)
Last Visit: 09/04/17 Next Visit: 09/04/18  Okay to refill Relafen?

## 2017-10-08 NOTE — Telephone Encounter (Signed)
Attempted to contact the patient and left message for patient to call the office.  

## 2017-10-08 NOTE — Telephone Encounter (Signed)
She should get CBC and CMP every 6 months which are due now.

## 2017-10-08 NOTE — Telephone Encounter (Signed)
Last Visit: 09/04/17 Next Visit: 09/04/18  Okay to refill per Dr. Estanislado Pandy

## 2017-10-13 NOTE — Progress Notes (Signed)
[name] [address], [place] [address] Phone: [phone] Subjective:    I'm seeing this patient by the request  of:    CC: Back pain follow-up  DVV:[id number]  Shelly [name] is a 56 y.o. female coming in with complaint of headache, neck pain and back pain follow-up.  Has done very well with manipulation.  Patient has been doing the exercises intermittently.  Patient is working on a regular basis.  Patient denies any radiation to any of the extremities.  Nothing severe that is stopped her from any activity.      Past Medical History:  Diagnosis Date  . Chronic kidney disease    kidney stones  . Depression   . Endometriosis   . Fibroid   . Fracture, tibial plateau   . GERD (gastroesophageal reflux disease)   . Heart murmur   . Hypothyroid   . Inflammatory arthritis 08/28/2016  . Insomnia 08/28/2016  . Migraine   . Osteoarthritis of both feet 08/28/2016  . Osteoarthritis of both hands 08/28/2016  . Osteoarthritis of both knees 08/28/2016  . 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 Sicca ([place]) 08/28/2016  . Vitamin D deficiency 08/28/2016   Past Surgical History:  Procedure Laterality Date  . LAPAROSCOPY  1994   endometriosis  . PELVIC LAPAROSCOPY  1993   d/t endometriosis--Dr. Matthew Saras   Social History   Socioeconomic History  . Marital status: Married    Spouse name: None  . Number of children: 2  . Years of education: None  . Highest education level: None  Social Needs  . Financial resource strain: None  . Food insecurity - worry: None  . Food insecurity - inability: None  . Transportation needs - medical: None  . Transportation needs - non-medical: None  Occupational History  . Occupation: Programmer, multimedia: [employer]  Tobacco Use  . Smoking status: Never Smoker  . Smokeless tobacco: Never Used  Substance and Sexual Activity  . Alcohol use: No  . Drug use: No  . Sexual activity: Yes    Partners: Male    Birth  control/protection: None  Other Topics Concern  . None  Social History Narrative  . None   Allergies  Allergen Reactions  . Compazine [Prochlorperazine Edisylate] Other (See Comments)    Muscle contractions   Family History  Problem Relation Age of Onset  . Hypertension Mother   . Dementia Mother   . Hypertension Father   . Stroke Father   . Heart disease Father   . Hypertension Brother   . Hypertension Maternal Grandmother   . Hypertension Maternal Grandfather   . Kidney failure Maternal Grandfather   . Aneurysm Maternal Grandfather   . Cancer Maternal Aunt 42       breast  . Aneurysm Maternal Aunt   . Cancer Cousin        breast     Past medical history, social, surgical and family history all reviewed in electronic medical record.  No pertanent information unless stated regarding to the chief complaint.   Review of Systems:Review of systems updated and as accurate as of 10/14/17  No , visual changes, nausea, vomiting, diarrhea, constipation, dizziness, abdominal pain, skin rash, fevers, chills, night sweats, weight loss, swollen lymph nodes, body aches, joint swelling, , chest pain, shortness of breath, mood changes.  Taken muscle aches  Objective  Blood pressure 130/80, pulse 72, height 5\' 6"  (1.676 m), weight 156 lb (70.8 kg), last  menstrual period 02/22/2014, SpO2 98 %. Systems examined below as of 10/14/17   General: No apparent distress alert and oriented x3 mood and affect normal, dressed appropriately.  HEENT: Pupils equal, extraocular movements intact  Respiratory: Patient's speak in full sentences and does not appear short of breath  Cardiovascular: No lower extremity edema, non tender, no erythema  Skin: Warm dry intact with no signs of infection or rash on extremities or on axial skeleton.  Abdomen: Soft nontender  Neuro: Cranial nerves II through XII are intact, neurovascularly intact in all extremities with 2+ DTRs and 2+ pulses.  Lymph: No  lymphadenopathy of posterior or anterior cervical chain or axillae bilaterally.  Gait normal with good balance and coordination.  MSK:  Non tender with full range of motion and good stability and symmetric strength and tone of shoulders, elbows, wrist, hip, knee and ankles bilaterally.  Neck: Inspection mild loss of lordosis. No palpable stepoffs. Negative Spurling's maneuver. Mild loss of range of motion lacking last 5 degrees of extension Grip strength and sensation normal in bilateral hands Strength good C4 to T1 distribution No sensory change to C4 to T1 Negative Hoffman sign bilaterally Reflexes normal  Low back shows patient does have full range of motion but does have a positive Corky Sox on the right side.  Mild discomfort in the paraspinal musculature of the lumbar spine.  Osteopathic findings C2 flexed rotated and side bent right T3 extended rotated and side bent right inhaled third rib T5 extended rotated and side bent right  L2 flexed rotated and side bent right Sacrum right on right     Impression and Recommendations:     This case required medical decision making of moderate complexity.      Note: This dictation was prepared with Dragon dictation along with smaller phrase technology. Any transcriptional errors that result from this process are unintentional.

## 2017-10-14 ENCOUNTER — Encounter: Payer: Self-pay | Admitting: Family Medicine

## 2017-10-14 ENCOUNTER — Ambulatory Visit (INDEPENDENT_AMBULATORY_CARE_PROVIDER_SITE_OTHER): Payer: 59 | Admitting: Family Medicine

## 2017-10-14 VITALS — BP 130/80 | HR 72 | Ht 66.0 in | Wt 156.0 lb

## 2017-10-14 DIAGNOSIS — M542 Cervicalgia: Secondary | ICD-10-CM | POA: Diagnosis not present

## 2017-10-14 DIAGNOSIS — M999 Biomechanical lesion, unspecified: Secondary | ICD-10-CM

## 2017-10-14 DIAGNOSIS — G8929 Other chronic pain: Secondary | ICD-10-CM | POA: Diagnosis not present

## 2017-10-14 NOTE — Assessment & Plan Note (Signed)
Doing decent.  Has seen patient for over a year and a half now.  Patient to see me in 6-week intervals.  We discussed icing regimen and home exercises, we discussed continuing to work on posture and ergonomics.  Still responding well to osteopathic manipulation.  Follow-up again in 4-6 weeks.

## 2017-10-14 NOTE — Assessment & Plan Note (Signed)
Decision today to treat with OMT was based on Physical Exam  After verbal consent patient was treated with HVLA, ME, FPR techniques in cervical, thoracic, rib, lumbar and sacral areas  Patient tolerated the procedure well with improvement in symptoms  Patient given exercises, stretches and lifestyle modifications  See medications in patient instructions if given  Patient will follow up in 6 weeks 

## 2017-10-14 NOTE — Patient Instructions (Signed)
Happy holidays!  6 weeks

## 2017-10-15 NOTE — Telephone Encounter (Signed)
Left message to advise patient she will need labs before we can refill her medication.

## 2017-11-17 ENCOUNTER — Other Ambulatory Visit: Payer: Self-pay | Admitting: Internal Medicine

## 2017-11-17 DIAGNOSIS — Z1231 Encounter for screening mammogram for malignant neoplasm of breast: Secondary | ICD-10-CM

## 2017-12-08 NOTE — Progress Notes (Signed)
[name] [address] [place], [place] [address] Phone: [phone] Subjective:    I'm seeing this patient by the request  of:    CC: Neck and back pain follow-up  [id number]  Shelly [name] is a 57 y.o. female coming in for back pain. She has been doing well since last visit. No new complaints.  Patient feels that the headaches as well as the neck pain is a lot better.  Was having.  Patient states that the knee is feeling much better as well.  Overall very happy with the results.     Past Medical History:  Diagnosis Date  . Chronic kidney disease    kidney stones  . Depression   . Endometriosis   . Fibroid   . Fracture, tibial plateau   . GERD (gastroesophageal reflux disease)   . Heart murmur   . Hypothyroid   . Inflammatory arthritis 08/28/2016  . Insomnia 08/28/2016  . Migraine   . Osteoarthritis of both feet 08/28/2016  . Osteoarthritis of both hands 08/28/2016  . Osteoarthritis of both knees 08/28/2016  . 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 Sicca ([place]) 08/28/2016  . Vitamin D deficiency 08/28/2016   Past Surgical History:  Procedure Laterality Date  . LAPAROSCOPY  1994   endometriosis  . PELVIC LAPAROSCOPY  1993   d/t endometriosis--Dr. Matthew Saras   Social History   Socioeconomic History  . Marital status: Married    Spouse name: None  . Number of children: 2  . Years of education: None  . Highest education level: None  Social Needs  . Financial resource strain: None  . Food insecurity - worry: None  . Food insecurity - inability: None  . Transportation needs - medical: None  . Transportation needs - non-medical: None  Occupational History  . Occupation: Programmer, multimedia: [employer]  Tobacco Use  . Smoking status: Never Smoker  . Smokeless tobacco: Never Used  Substance and Sexual Activity  . Alcohol use: No  . Drug use: No  . Sexual activity: Yes    Partners: Male    Birth control/protection: None  Other Topics  Concern  . None  Social History Narrative  . None   Allergies  Allergen Reactions  . Compazine [Prochlorperazine Edisylate] Other (See Comments)    Muscle contractions   Family History  Problem Relation Age of Onset  . Hypertension Mother   . Dementia Mother   . Hypertension Father   . Stroke Father   . Heart disease Father   . Hypertension Brother   . Hypertension Maternal Grandmother   . Hypertension Maternal Grandfather   . Kidney failure Maternal Grandfather   . Aneurysm Maternal Grandfather   . Cancer Maternal Aunt 42       breast  . Aneurysm Maternal Aunt   . Cancer Cousin        breast     Past medical history, social, surgical and family history all reviewed in electronic medical record.  No pertanent information unless stated regarding to the chief complaint.   Review of Systems:Review of systems updated and as accurate as of 12/09/17  No headache, visual changes, nausea, vomiting, diarrhea, constipation, dizziness, abdominal pain, skin rash, fevers, chills, night sweats, weight loss, swollen lymph nodes, body aches, joint swelling, muscle aches, chest pain, shortness of breath, mood changes.   Objective  Blood pressure 138/80, pulse 90, height 5\' 6"  (1.676 m), weight 159 lb (72.1 kg), last menstrual period  02/22/2014, SpO2 96 %. Systems examined below as of 12/09/17   General: No apparent distress alert and oriented x3 mood and affect normal, dressed appropriately.  HEENT: Pupils equal, extraocular movements intact  Respiratory: Patient's speak in full sentences and does not appear short of breath  Cardiovascular: No lower extremity edema, non tender, no erythema  Skin: Warm dry intact with no signs of infection or rash on extremities or on axial skeleton.  Abdomen: Soft nontender  Neuro: Cranial nerves II through XII are intact, neurovascularly intact in all extremities with 2+ DTRs and 2+ pulses.  Lymph: No lymphadenopathy of posterior or anterior cervical  chain or axillae bilaterally.  Gait normal with good balance and coordination.  MSK:  Non tender with full range of motion and good stability and symmetric strength and tone of shoulders, elbows, wrist, hip, knee and ankles bilaterally.  Neck: Inspection mild loss of lordosis. No palpable stepoffs. Negative Spurling's maneuver. Patient does have some mild difficulty with range of motion lacking last 5 degrees of sidebending bilaterally Grip strength and sensation normal in bilateral hands Strength good C4 to T1 distribution No sensory change to C4 to T1 Negative Hoffman sign bilaterally Reflexes normal  Osteopathic findings C2 flexed rotated and side bent right C7 flexed rotated and side bent left T3 extended rotated and side bent right inhaled third rib L2 flexed rotated and side bent right Sacrum right on right     Impression and Recommendations:     This case required medical decision making of moderate complexity.      Note: This dictation was prepared with Dragon dictation along with smaller phrase technology. Any transcriptional errors that result from this process are unintentional.

## 2017-12-09 ENCOUNTER — Ambulatory Visit: Payer: 59 | Admitting: Family Medicine

## 2017-12-09 ENCOUNTER — Encounter: Payer: Self-pay | Admitting: Family Medicine

## 2017-12-09 VITALS — BP 138/80 | HR 90 | Ht 66.0 in | Wt 159.0 lb

## 2017-12-09 DIAGNOSIS — G2589 Other specified extrapyramidal and movement disorders: Secondary | ICD-10-CM

## 2017-12-09 DIAGNOSIS — M999 Biomechanical lesion, unspecified: Secondary | ICD-10-CM | POA: Diagnosis not present

## 2017-12-09 NOTE — Assessment & Plan Note (Signed)
Decision today to treat with OMT was based on Physical Exam  After verbal consent patient was treated with HVLA, ME, FPR techniques in cervical, thoracic, rib, lumbar and sacral areas  Patient tolerated the procedure well with improvement in symptoms  Patient given exercises, stretches and lifestyle modifications  See medications in patient instructions if given  Patient will follow up in 4 weeks 

## 2017-12-09 NOTE — Patient Instructions (Signed)
Good to see you  Much better  Se eme again in 2-3 months

## 2017-12-09 NOTE — Assessment & Plan Note (Signed)
Still believe that this is likely contributing to a lot of the discomfort.  I do believe that the underlying stress can also be complaining well.  Discussed the ergonomics again.  Continue the vitamin supplementation.  Follow-up again 2-3 months.

## 2017-12-11 ENCOUNTER — Ambulatory Visit
Admission: RE | Admit: 2017-12-11 | Discharge: 2017-12-11 | Disposition: A | Discharge status: Home / Self Care | Payer: 59 | Source: Ambulatory Visit | Attending: Internal Medicine | Admitting: Internal Medicine

## 2017-12-11 DIAGNOSIS — Z1231 Encounter for screening mammogram for malignant neoplasm of breast: Secondary | ICD-10-CM

## 2017-12-12 ENCOUNTER — Other Ambulatory Visit: Payer: Self-pay | Admitting: Internal Medicine

## 2017-12-12 DIAGNOSIS — R928 Other abnormal and inconclusive findings on diagnostic imaging of breast: Secondary | ICD-10-CM

## 2017-12-17 ENCOUNTER — Ambulatory Visit
Admission: RE | Admit: 2017-12-17 | Discharge: 2017-12-17 | Disposition: A | Discharge status: Home / Self Care | Payer: 59 | Source: Ambulatory Visit | Attending: Internal Medicine | Admitting: Internal Medicine

## 2017-12-17 ENCOUNTER — Ambulatory Visit: Payer: 59

## 2017-12-17 DIAGNOSIS — R928 Other abnormal and inconclusive findings on diagnostic imaging of breast: Secondary | ICD-10-CM

## 2017-12-17 DIAGNOSIS — R922 Inconclusive mammogram: Secondary | ICD-10-CM | POA: Diagnosis not present

## 2017-12-25 DIAGNOSIS — H524 Presbyopia: Secondary | ICD-10-CM | POA: Diagnosis not present

## 2017-12-25 DIAGNOSIS — H52222 Regular astigmatism, left eye: Secondary | ICD-10-CM | POA: Diagnosis not present

## 2018-02-22 MED FILL — LEVOTHYROXINE 75 MCG TABLET: 75 | 90 days supply | Qty: 90 | Fill #3

## 2018-03-09 ENCOUNTER — Ambulatory Visit: Payer: 59 | Admitting: Family Medicine

## 2018-03-26 ENCOUNTER — Ambulatory Visit: Payer: 59 | Admitting: Family Medicine

## 2018-04-06 ENCOUNTER — Other Ambulatory Visit (HOSPITAL_COMMUNITY)
Admission: RE | Admit: 2018-04-06 | Discharge: 2018-04-06 | Disposition: A | Discharge status: Home / Self Care | Payer: 59 | Source: Ambulatory Visit | Attending: Internal Medicine | Admitting: Internal Medicine

## 2018-04-06 ENCOUNTER — Other Ambulatory Visit: Payer: Self-pay | Admitting: Internal Medicine

## 2018-04-06 DIAGNOSIS — Z01411 Encounter for gynecological examination (general) (routine) with abnormal findings: Secondary | ICD-10-CM | POA: Diagnosis not present

## 2018-04-06 DIAGNOSIS — N952 Postmenopausal atrophic vaginitis: Secondary | ICD-10-CM | POA: Diagnosis not present

## 2018-04-06 DIAGNOSIS — D72819 Decreased white blood cell count, unspecified: Secondary | ICD-10-CM | POA: Diagnosis not present

## 2018-04-06 DIAGNOSIS — M1990 Unspecified osteoarthritis, unspecified site: Secondary | ICD-10-CM | POA: Diagnosis not present

## 2018-04-06 DIAGNOSIS — Z0000 Encounter for general adult medical examination without abnormal findings: Secondary | ICD-10-CM | POA: Diagnosis not present

## 2018-04-06 DIAGNOSIS — Z1211 Encounter for screening for malignant neoplasm of colon: Secondary | ICD-10-CM | POA: Diagnosis not present

## 2018-04-06 DIAGNOSIS — R140 Abdominal distension (gaseous): Secondary | ICD-10-CM | POA: Diagnosis not present

## 2018-04-06 DIAGNOSIS — E559 Vitamin D deficiency, unspecified: Secondary | ICD-10-CM | POA: Diagnosis not present

## 2018-04-06 NOTE — Progress Notes (Signed)
[name] [address], [place] [address] Phone: [phone] Subjective:    I'm seeing this patient by the request  of:    CC: Neck pain follow-up  [id number]  Shelly [name] is a 57 y.o. female coming in with complaint of neck pain. Patient mentions getting the flu in March and she had some discomfort in her neck due to coughing a lot otherwise she has not had any issues since last visit. Is here for OMT.  Nothing severe overall.  Nothing is stopping her from activities     Past Medical History:  Diagnosis Date  . Chronic kidney disease    kidney stones  . Depression   . Endometriosis   . Fibroid   . Fracture, tibial plateau   . GERD (gastroesophageal reflux disease)   . Heart murmur   . Hypothyroid   . Inflammatory arthritis 08/28/2016  . Insomnia 08/28/2016  . Migraine   . Osteoarthritis of both feet 08/28/2016  . Osteoarthritis of both hands 08/28/2016  . Osteoarthritis of both knees 08/28/2016  . 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 Sicca ([place]) 08/28/2016  . Vitamin D deficiency 08/28/2016   Past Surgical History:  Procedure Laterality Date  . LAPAROSCOPY  1994   endometriosis  . PELVIC LAPAROSCOPY  1993   d/t endometriosis--Dr. Matthew Saras   Social History   Socioeconomic History  . Marital status: Married    Spouse name: Not on file  . Number of children: 2  . Years of education: Not on file  . Highest education level: Not on file  Occupational History  . Occupation: Programmer, multimedia: [employer]  . Financial resource strain: Not on file  . Food insecurity:    Worry: Not on file    Inability: Not on file  . Transportation needs:    Medical: Not on file    Non-medical: Not on file  Tobacco Use  . Smoking status: Never Smoker  . Smokeless tobacco: Never Used  Substance and Sexual Activity  . Alcohol use: No  . Drug use: No  . Sexual activity: Yes    Partners: Male    Birth control/protection: None    Lifestyle  . Physical activity:    Days per week: Not on file    Minutes per session: Not on file  . Stress: Not on file  Relationships  . Social connections:    Talks on phone: Not on file    Gets together: Not on file    Attends religious service: Not on file    Active member of club or organization: Not on file    Attends meetings of clubs or organizations: Not on file    Relationship status: Not on file  Other Topics Concern  . Not on file  Social History Narrative  . Not on file   Allergies  Allergen Reactions  . Compazine [Prochlorperazine Edisylate] Other (See Comments)    Muscle contractions   Family History  Problem Relation Age of Onset  . Hypertension Mother   . Dementia Mother   . Hypertension Father   . Stroke Father   . Heart disease Father   . Hypertension Brother   . Hypertension Maternal Grandmother   . Hypertension Maternal Grandfather   . Kidney failure Maternal Grandfather   . Aneurysm Maternal Grandfather   . Cancer Maternal Aunt 42       breast  . Aneurysm Maternal Aunt   .  Cancer Cousin        breast     Past medical history, social, surgical and family history all reviewed in electronic medical record.  No pertanent information unless stated regarding to the chief complaint.   Review of Systems:Review of systems updated and as accurate as of 04/07/18  No headache, visual changes, nausea, vomiting, diarrhea, constipation, dizziness, abdominal pain, skin rash, fevers, chills, night sweats, weight loss, swollen lymph nodes, body aches, joint swelling, muscle aches, chest pain, shortness of breath, mood changes.  Mild positive muscle aches  Objective  Blood pressure 124/82, pulse 73, height 5\' 6"  (1.676 m), weight 155 lb (70.3 kg), last menstrual period 02/22/2014, SpO2 98 %. Systems examined below as of 04/07/18   General: No apparent distress alert and oriented x3 mood and affect normal, dressed appropriately.  HEENT: Pupils equal,  extraocular movements intact  Respiratory: Patient's speak in full sentences and does not appear short of breath  Cardiovascular: No lower extremity edema, non tender, no erythema  Skin: Warm dry intact with no signs of infection or rash on extremities or on axial skeleton.  Abdomen: Soft nontender  Neuro: Cranial nerves II through XII are intact, neurovascularly intact in all extremities with 2+ DTRs and 2+ pulses.  Lymph: No lymphadenopathy of posterior or anterior cervical chain or axillae bilaterally.  Gait normal with good balance and coordination.  MSK:  Non tender with full range of motion and good stability and symmetric strength and tone of shoulders, elbows, wrist, hip, knee and ankles bilaterally.  Neck: Inspection mild loss of lordosis. No palpable stepoffs. Negative Spurling's maneuver. Patient does have some limited range of motion Grip strength and sensation normal in bilateral hands Strength good C4 to T1 distribution No sensory change to C4 to T1 Negative Hoffman sign bilaterally Reflexes normal Tightness of the trapezius bilaterally  Osteopathic findings C4 flexed rotated and side bent left C6 flexed rotated and side bent left T3 extended rotated and side bent right inhaled third rib T9 extended rotated and side bent left L2 flexed rotated and side bent right Sacrum right on right     Impression and Recommendations:     This case required medical decision making of moderate complexity.      Note: This dictation was prepared with Dragon dictation along with smaller phrase technology. Any transcriptional errors that result from this process are unintentional.

## 2018-04-07 ENCOUNTER — Ambulatory Visit: Payer: 59 | Admitting: Family Medicine

## 2018-04-07 ENCOUNTER — Encounter: Payer: Self-pay | Admitting: Family Medicine

## 2018-04-07 VITALS — BP 124/82 | HR 73 | Ht 66.0 in | Wt 155.0 lb

## 2018-04-07 DIAGNOSIS — M999 Biomechanical lesion, unspecified: Secondary | ICD-10-CM

## 2018-04-07 DIAGNOSIS — E559 Vitamin D deficiency, unspecified: Secondary | ICD-10-CM | POA: Diagnosis not present

## 2018-04-07 DIAGNOSIS — R140 Abdominal distension (gaseous): Secondary | ICD-10-CM | POA: Diagnosis not present

## 2018-04-07 DIAGNOSIS — G2589 Other specified extrapyramidal and movement disorders: Secondary | ICD-10-CM

## 2018-04-07 DIAGNOSIS — D72819 Decreased white blood cell count, unspecified: Secondary | ICD-10-CM | POA: Diagnosis not present

## 2018-04-07 DIAGNOSIS — Z1211 Encounter for screening for malignant neoplasm of colon: Secondary | ICD-10-CM | POA: Diagnosis not present

## 2018-04-07 DIAGNOSIS — Z0000 Encounter for general adult medical examination without abnormal findings: Secondary | ICD-10-CM | POA: Diagnosis not present

## 2018-04-07 NOTE — Assessment & Plan Note (Signed)
Continues to be her underlying cause of most of her discomfort and pain.  We discussed icing regimen and home exercise.  Discussed which activities to do which wants to avoid.  Increase activity as tolerated.  Follow-up again in 3 months

## 2018-04-07 NOTE — Patient Instructions (Signed)
Good to see you  Shelly [name] is your friend.  See me again in 3 months Love to meet your mom!

## 2018-04-07 NOTE — Assessment & Plan Note (Signed)
Decision today to treat with OMT was based on Physical Exam  After verbal consent patient was treated with HVLA, ME, FPR techniques in cervical, thoracic, lumbar and sacral areas  Patient tolerated the procedure well with improvement in symptoms  Patient given exercises, stretches and lifestyle modifications  See medications in patient instructions if given  Patient will follow up in 1-2 weeks 

## 2018-04-09 LAB — CYTOLOGY - PAP
DIAGNOSIS: NEGATIVE
HPV (WINDOPATH): NOT DETECTED
Herpes: NEGATIVE

## 2018-04-16 DIAGNOSIS — Z1211 Encounter for screening for malignant neoplasm of colon: Secondary | ICD-10-CM | POA: Diagnosis not present

## 2018-07-07 MED FILL — LEVOTHYROXINE 75 MCG TABLET: 75 | 90 days supply | Qty: 90 | Fill #0

## 2018-07-07 NOTE — Progress Notes (Signed)
[name] [place], [place] [address] Phone: [phone] Subjective:   Shelly [name], am serving as a scribe for Dr. Hulan Saas.    CC: Back pain follow-up  [id number]  Shelly [name] is a 57 y.o. female coming in with complaint of back pain. [name] changes since last visit. She feels tight and that it is time for an adjustment.  Patient has responded very well to manipulation.  Seems to be somewhat associated with varus stress.  Patient parents seem to be having more illnesses since she has been starting to take care of him.  Could be moving here shortly.       Past Medical History:  Diagnosis Date  . Chronic kidney disease    kidney stones  . Depression   . Endometriosis   . Fibroid   . Fracture, tibial plateau   . GERD (gastroesophageal reflux disease)   . Heart murmur   . Hypothyroid   . Inflammatory arthritis 08/28/2016  . Insomnia 08/28/2016  . Migraine   . Osteoarthritis of both feet 08/28/2016  . Osteoarthritis of both hands 08/28/2016  . Osteoarthritis of both knees 08/28/2016  . 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 Sicca ([place]) 08/28/2016  . Vitamin D deficiency 08/28/2016   Past Surgical History:  Procedure Laterality Date  . LAPAROSCOPY  1994   endometriosis  . PELVIC LAPAROSCOPY  1993   d/t endometriosis--Dr. Matthew Saras   Social History   Socioeconomic History  . Marital status: Married    Spouse name: Not on file  . Number of children: 2  . Years of education: Not on file  . Highest education level: Not on file  Occupational History  . Occupation: Programmer, multimedia: [employer]  . Financial resource strain: Not on file  . Food insecurity:    Worry: Not on file    Inability: Not on file  . Transportation needs:    Medical: Not on file    Non-medical: Not on file  Tobacco Use  . Smoking status: Never Smoker  . Smokeless tobacco: Never Used  Substance and Sexual Activity  . Alcohol use: [name]    . Drug use: [name]  . Sexual activity: Yes    Partners: Male    Birth control/protection: None  Lifestyle  . Physical activity:    Days per week: Not on file    Minutes per session: Not on file  . Stress: Not on file  Relationships  . Social connections:    Talks on phone: Not on file    Gets together: Not on file    Attends religious service: Not on file    Active member of club or organization: Not on file    Attends meetings of clubs or organizations: Not on file    Relationship status: Not on file  Other Topics Concern  . Not on file  Social History Narrative  . Not on file   Allergies  Allergen Reactions  . Compazine [Prochlorperazine Edisylate] Other (See Comments)    Muscle contractions   Family History  Problem Relation Age of Onset  . Hypertension Mother   . Dementia Mother   . Hypertension Father   . Stroke Father   . Heart disease Father   . Hypertension Brother   . Hypertension Maternal Grandmother   . Hypertension Maternal Grandfather   . Kidney failure Maternal Grandfather   . Aneurysm Maternal Grandfather   . Cancer Maternal  Aunt 42       breast  . Aneurysm Maternal Aunt   . Cancer Cousin        breast    Current Outpatient Medications (Endocrine & Metabolic):  .  levothyroxine (SYNTHROID, LEVOTHROID) 75 MCG tablet, Take 1 tablet (75 mcg total) by mouth daily.      Current Outpatient Medications (Other):  .  baclofen (LIORESAL) 10 MG tablet, TAKE 1 TABLET BY MOUTH 3 TIMES DAILY AS NEEDED FOR MUSCLE PAIN .  buPROPion (WELLBUTRIN SR) 200 MG 12 hr tablet, Take 300 mg every other day by mouth.  .  cholecalciferol (VITAMIN D) 1000 units tablet, Take 2,000 Units by mouth daily. .  Diclofenac Sodium (PENNSAID) 2 % SOLN, Place 2 application onto the skin 2 (two) times daily. Javier Docker Oil 500 MG CAPS, Take 750 mg by mouth. .  pantoprazole (PROTONIX) 40 MG tablet, Take 1 tablet (40 mg total) by mouth daily. .  Probiotic Product (PROBIOTIC-10 PO), Take by  mouth.    Past medical history, social, surgical and family history all reviewed in electronic medical record.  [name] pertanent information unless stated regarding to the chief complaint.   Review of Systems:  [name] headache, visual changes, nausea, vomiting, diarrhea, constipation, dizziness, abdominal pain, skin rash, fevers, chills, night sweats, weight loss, swollen lymph nodes, body aches, joint swelling,chest pain, shortness of breath, mood changes.  Positive muscle aches  Objective  Blood pressure 132/90, pulse 77, height 5\' 6"  (1.676 m), weight 155 lb (70.3 kg), last menstrual period 02/22/2014, SpO2 98 %.   General: [name] apparent distress alert and oriented x3 mood and affect normal, dressed appropriately.  HEENT: Pupils equal, extraocular movements intact  Respiratory: Patient's speak in full sentences and does not appear short of breath  Cardiovascular: [name] lower extremity edema, non tender, [name] erythema  Skin: Warm dry intact with [name] signs of infection or rash on extremities or on axial skeleton.  Abdomen: Soft nontender  Neuro: Cranial nerves II through XII are intact, neurovascularly intact in all extremities with 2+ DTRs and 2+ pulses.  Lymph: [name] lymphadenopathy of posterior or anterior cervical chain or axillae bilaterally.  Gait normal with good balance and coordination.  MSK: Mild tender with full range of motion and good stability and symmetric strength and tone of shoulders, elbows, wrist, hip, knee and ankles bilaterally.  Back exam shows some mild increase in kyphosis of the upper back and some loss of lordosis of the lumbar spine.  Patient does have tenderness to palpation mostly in the thoracolumbar junction.  Some tightness in the neck with side bending.  [name] radicular symptoms.  Negative straight leg test.  Negative Faber test.  Deep tendon reflexes intact and symmetric.  Osteopathic findings C2 flexed rotated and side bent left T6 extended rotated and side bent right inhaled  rib T8 extended rotated and side bent left L2 flexed rotated and side bent right Sacrum right on right    Impression and Recommendations:     This case required medical decision making of moderate complexity. The above documentation has been reviewed and is accurate and complete [name], DO       Note: This dictation was prepared with Dragon dictation along with smaller phrase technology. Any transcriptional errors that result from this process are unintentional.

## 2018-07-08 ENCOUNTER — Ambulatory Visit: Payer: 59 | Admitting: Family Medicine

## 2018-07-08 ENCOUNTER — Encounter: Payer: Self-pay | Admitting: Family Medicine

## 2018-07-08 VITALS — BP 132/90 | HR 77 | Ht 66.0 in | Wt 155.0 lb

## 2018-07-08 DIAGNOSIS — M9902 Segmental and somatic dysfunction of thoracic region: Secondary | ICD-10-CM | POA: Diagnosis not present

## 2018-07-08 DIAGNOSIS — M999 Biomechanical lesion, unspecified: Secondary | ICD-10-CM | POA: Diagnosis not present

## 2018-07-08 DIAGNOSIS — M9903 Segmental and somatic dysfunction of lumbar region: Secondary | ICD-10-CM | POA: Diagnosis not present

## 2018-07-08 DIAGNOSIS — M9901 Segmental and somatic dysfunction of cervical region: Secondary | ICD-10-CM

## 2018-07-08 DIAGNOSIS — M9908 Segmental and somatic dysfunction of rib cage: Secondary | ICD-10-CM

## 2018-07-08 DIAGNOSIS — G8929 Other chronic pain: Secondary | ICD-10-CM

## 2018-07-08 DIAGNOSIS — M9904 Segmental and somatic dysfunction of sacral region: Secondary | ICD-10-CM

## 2018-07-08 DIAGNOSIS — M542 Cervicalgia: Secondary | ICD-10-CM | POA: Diagnosis not present

## 2018-07-08 NOTE — Assessment & Plan Note (Signed)
Chronic neck pain.  Not as many headaches.  Has been responding well to the different medication she is on at this moment.  We discussed about different things that can help her stress including mindfulness.  Responded well to manipulation.  Follow-up again in 3 months.

## 2018-07-08 NOTE — Assessment & Plan Note (Signed)
Decision today to treat with OMT was based on Physical Exam  After verbal consent patient was treated with HVLA, ME, FPR techniques in cervical, thoracic, rib, lumbar and sacral areas  Patient tolerated the procedure well with improvement in symptoms  Patient given exercises, stretches and lifestyle modifications  See medications in patient instructions if given  Patient will follow up in 12 weeks 

## 2018-07-08 NOTE — Patient Instructions (Signed)
Good to see you  Shelly [name] is your friend.  Stay active Posture is key  See me again in 3 months

## 2018-07-29 ENCOUNTER — Other Ambulatory Visit: Payer: Self-pay

## 2018-07-29 ENCOUNTER — Ambulatory Visit: Payer: 59 | Admitting: Obstetrics and Gynecology

## 2018-07-29 ENCOUNTER — Encounter: Payer: Self-pay | Admitting: Obstetrics and Gynecology

## 2018-07-29 VITALS — BP 138/72 | HR 68 | Ht 65.25 in | Wt 158.0 lb

## 2018-07-29 DIAGNOSIS — N951 Menopausal and female climacteric states: Secondary | ICD-10-CM

## 2018-07-29 DIAGNOSIS — N952 Postmenopausal atrophic vaginitis: Secondary | ICD-10-CM

## 2018-07-29 MED ORDER — ESTRADIOL 2 MG VA RING: 2.0000 mg | VAGINAL_RING | VAGINAL | 0 refills | Status: DC

## 2018-07-29 MED FILL — ESTRING 2 MG VAGINAL RING: 2 | 90 days supply | Qty: 1 | Fill #0

## 2018-07-29 NOTE — Patient Instructions (Addendum)
Estradiol vaginal ring (Estring) What is this medicine? ESTRADIOL (es tra DYE ole) vaginal ring is an insert that contains a female hormone. This medicine helps relieve symptoms of vaginal irritation and dryness that occurs in some women during menopause. This medicine may be used for other purposes; ask your health care provider or pharmacist if you have questions. COMMON BRAND NAME(S): Estring What should I tell my health care provider before I take this medicine? They need to know if you have any of these conditions: -abnormal vaginal bleeding -blood vessel disease or blood clots -breast, cervical, endometrial, ovarian, liver, or uterine cancer -dementia -diabetes -gallbladder disease -heart disease or recent heart attack -high blood pressure -high cholesterol -high level of calcium in the blood -hysterectomy -kidney disease -liver disease -migraine headaches -protein C deficiency -protein S deficiency -stroke -systemic lupus erythematosus (SLE) -tobacco smoker -an unusual or allergic reaction to estrogens, other hormones, medicines, foods, dyes, or preservatives -pregnant or trying to get pregnant -breast-feeding How should I use this medicine? This medicine may be inserted by you or your physician. Follow the directions that are included with your prescription. If you are unsure how to insert the ring, contact your doctor or health care professional. The vaginal ring should remain in place for 90 days. After 90 days you should replace your old ring and insert a new one. Do not stop using except on the advice of your doctor or health care professional. Contact your pediatrician regarding the use of this medicine in children. Special care may be   Atrophic Vaginitis Atrophic vaginitis is a condition in which the tissues that line the vagina become dry and thin. This condition is most common in women who have stopped having regular menstrual periods (menopause). This usually  starts when a woman is 31-31 years old. Estrogen helps to keep the vagina moist. It stimulates the vagina to produce a clear fluid that lubricates the vagina for sexual intercourse. This fluid also protects the vagina from infection. Lack of estrogen can cause the lining of the vagina to get thinner and dryer. The vagina may also shrink in size. It may become less elastic. Atrophic vaginitis tends to get worse over time as a woman's estrogen level drops. What are the causes? This condition is caused by the normal drop in estrogen that happens around the time of menopause. What increases the risk? Certain conditions or situations may lower a woman's estrogen level, which increases her risk of atrophic vaginitis. These include:  Taking medicine that blocks estrogen.  Having ovaries removed surgically.  Being treated for cancer with X-ray treatment (radiation) or medicines (chemotherapy).  Exercising very hard and often.  Having an eating disorder (anorexia).  Giving birth or breastfeeding.  Being over the age of 5.  Smoking.  What are the signs or symptoms? Symptoms of this condition include:  Pain, soreness, or bleeding during sexual intercourse (dyspareunia).  Vaginal burning, irritation, or itching.  Pain or bleeding during a vaginal examination using a speculum (pelvic exam).  Loss of interest in sexual activity.  Having burning pain when passing urine.  Vaginal discharge that is brown or yellow.  In some cases, there are no symptoms. How is this diagnosed? This condition is diagnosed with a medical history and physical exam. This will include a pelvic exam that checks whether the inside of your vagina appears pale, thin, or dry. Rarely, you may also have other tests, including:  A urine test.  A test that checks the acid balance in your  vaginal fluid (acid balance test).  How is this treated? Treatment for this condition may depend on the severity of your symptoms.  Treatment may include:  Using an over-the-counter vaginal lubricant before you have sexual intercourse.  Using a long-acting vaginal moisturizer.  Using low-dose vaginal estrogen for moderate to severe symptoms that do not respond to other treatments. Options include creams, tablets, and inserts (vaginal rings). Before using vaginal estrogen, tell your health care provider if you have a history of: ? Breast cancer. ? Endometrial cancer. ? Blood clots.  Taking medicines. You may be able to take a daily pill for dyspareunia. Discuss all of the risks of this medicine with your health care provider. It is usually not recommended for women who have a family history or personal history of breast cancer.  If your symptoms are very mild and you are not sexually active, you may not need treatment. Follow these instructions at home:  Take medicines only as directed by your health care provider. Do not use herbal or alternative medicines unless your health care provider says that you can.  Use over-the-counter creams, lubricants, or moisturizers for dryness only as directed by your health care provider.  If your atrophic vaginitis is caused by menopause, discuss all of your menopausal symptoms and treatment options with your health care provider.  Do not douche.  Do not use products that can make your vagina dry. These include: ? Scented feminine sprays. ? Scented tampons. ? Scented soaps.  If it hurts to have sex, talk with your sexual partner. Contact a health care provider if:  Your discharge looks different than normal.  Your vagina has an unusual smell.  You have new symptoms.  Your symptoms do not improve with treatment.  Your symptoms get worse. This information is not intended to replace advice given to you by your health care provider. Make sure you discuss any questions you have with your health care provider. Document Released: 02/28/2015 Document Revised: 03/21/2016  Document Reviewed: 10/05/2014 Elsevier Interactive Patient Education  [employer]. needed. A patient package insert for the product will be given with each prescription and refill. Read this sheet carefully each time. The sheet may change frequently. Overdosage: If you think you have taken too much of this medicine contact a poison control center or emergency room at once. NOTE: This medicine is only for you. Do not share this medicine with others. What if I miss a dose? If you miss a dose, use it as soon as you can. If it is almost time for your next dose, use only that dose. Do not use double or extra doses. What may interact with this medicine? Do not take this medicine with any of the following medications: -aromatase inhibitors like aminoglutethimide, anastrozole, exemestane, letrozole, testolactone, vorozole This medicine may also interact with the following medications: -carbamazepine -certain antibiotics used to treat infections -certain barbiturates used for inducing sleep or treating seizures -grapefruit juice -medicines for fungus infections like itraconazole and ketoconazole -raloxifene or tamoxifen -rifabutin, rifampin, or rifapentine -ritonavir -St. John's Wort This list may not describe all possible interactions. Give your health care provider a list of all the medicines, herbs, non-prescription drugs, or dietary supplements you use. Also tell them if you smoke, drink alcohol, or use illegal drugs. Some items may interact with your medicine. What should I watch for while using this medicine? Visit your doctor or health care professional for regular checks on your progress. You will need a regular breast and pelvic  exam and Pap smear while on this medicine. You should also discuss the need for regular mammograms with your health care professional, and follow his or her guidelines for these tests. This medicine can make your body retain fluid, making your fingers, hands,  or ankles swell. Your blood pressure can go up. Contact your doctor or health care professional if you feel you are retaining fluid. If you have any reason to think you are pregnant, stop taking this medicine right away and contact your doctor or health care professional. Smoking increases the risk of getting a blood clot or having a stroke while you are taking this medicine, especially if you are more than 55 years old. You are strongly advised not to smoke. If you wear contact lenses and notice visual changes, or if the lenses begin to feel uncomfortable, consult your eye doctor or health care professional. This medicine can increase the risk of developing a condition (endometrial hyperplasia) that may lead to cancer of the lining of the uterus. Taking progestins, another hormone drug, with this medicine lowers the risk of developing this condition. Therefore, if your uterus has not been removed (by a hysterectomy), your doctor may prescribe a progestin for you to take together with your estrogen. You should know, however, that taking estrogens with progestins may have additional health risks. You should discuss the use of estrogens and progestins with your health care professional to determine the benefits and risks for you. If you are going to have surgery, you may need to stop taking this medicine. Consult your health care professional for advice before you schedule the surgery. You may bathe or participate in other activities while using this medicine. You do not need to remove the vaginal ring during sexual or other activities unless you are more comfortable doing so. Within the 90-day dosage period, you may remove the vaginal ring, rinse it with clean lukewarm (not hot or boiling) water, and re-insert the ring as needed. What side effects may I notice from receiving this medicine? Side effects that you should report to your doctor or health care professional as soon as possible: -allergic reactions  like skin rash, itching or hives, swelling of the face, lips, or tongue -breast tissue changes or discharge -signs and symptoms of a blood clot such as breathing problems; changes in vision; chest pain; severe, sudden headache; pain, swelling, warmth in the leg; trouble speaking; sudden numbness or weakness of the face, arm or leg -signs and symptoms of infection like fever or chills; vomiting; diarrhea; muscle pain; dizziness; or a red, sunburn-like rash on face and body -signs and symptoms of liver injury like dark yellow or brown urine; general ill feeling or flu-like symptoms; light-colored stools; loss of appetite; nausea; right upper belly pain; unusually weak or tired; yellowing of the eyes or skin -symptoms of bowel blockage like constipation, abdominal swelling, abdominal pain, inability to pass gas or have a bowel movement -symptoms of vaginal infection like itching, irritation or unusual discharge -unusual or increased vaginal bleeding -vaginal pain or soreness, redness, swelling Side effects that usually do not require medical attention (report to your doctor or health care professional if they continue or are bothersome): -breast tenderness -fluid retention -hair loss -headache -nausea -upset stomach -vaginal spotting This list may not describe all possible side effects. Call your doctor for medical advice about side effects. You may report side effects to FDA at 1-800-FDA-1088. Where should I keep my medicine? Keep out of the reach of children. Store at  room temperature between 15 and 25 degrees C (59 and 77 degrees F). Throw away any unused medicine after the expiration date. NOTE: This sheet is a summary. It may not cover all possible information. If you have questions about this medicine, talk to your doctor, pharmacist, or health care provider.  2018 Elsevier/Gold Standard (2014-08-08 13:20:25)

## 2018-07-29 NOTE — Progress Notes (Signed)
GYNECOLOGY  VISIT   HPI: 57 y.o.   Married  Caucasian  female   [id number] with Patient's last menstrual period was 02/22/2014.   here for vaginal dryness and hormone replacement therapy.    Unable to have intercourse due to pain. Has tried water based products and cooking oils.   Hx endometrial ablation 1993.  She had a pelvic US, sonohysterogram, and EMB done 03/03/14.  EMB showed benign proliferative endometrium.  No atypia and no hyperplasia noted. She took progesterone only pills for one month and stopped due to leg pain.  Thinks she had a small period a couple of weeks later and then had 2 menses every 6 months for one year following.   Having hot flashes.  Feels like these are lessening now.   She is questioning her dx of Sjogrens.  Does have hypothyroidism.   Has infertility and 2 adopted children.   Mother has Alzheimer's. FH of members with aneurysms.  She has declined MRA of brain to date.   She sees her PCP for her well woman visits.  GYNECOLOGIC HISTORY: Patient's last menstrual period was 02/22/2014. Contraception:  postmenopausal Menopausal hormone therapy:  none Last mammogram:  12/17/2017 BI-RADS category: 1: Negative Last pap smear:   04/06/2018 negative, HPV not detected, 01/10/2015 negative, HPV not detected        OB History    Gravida  0   Para  0   Term  0   Preterm  0   AB  0   Living  0     SAB  0   TAB  0   Ectopic  0   Multiple  0   Live Births           Obstetric Comments  Has 2 adopted children           Patient Active Problem List   Diagnosis Date Noted  . Nonallopathic lesion of rib cage 07/08/2018  . Nonallopathic lesion of sacral region 07/08/2018  . 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 Osteoarthritis of both hands 08/28/2016  . Osteoarthritis of both feet 08/28/2016  . Osteoarthritis of both knees 08/28/2016  . Insomnia 08/28/2016  . 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 Vitamin D deficiency  08/28/2016  . Trapezius muscle spasm 08/28/2016  . Chronic neck pain 03/01/2016  . Scapular dyskinesis 03/01/2016  . Nonallopathic lesion of cervical region 03/01/2016  . Nonallopathic lesion of thoracic region 03/01/2016  . Nonallopathic lesion of lumbosacral region 03/01/2016  . DUB (dysfunctional uterine bleeding) 01/05/2014  . Family history of abdominal aortic aneurysm 08/12/2012  . Renal calculus, left 06/04/2012  . Irritability 05/01/2012  . GERD (gastroesophageal reflux disease) 05/01/2012  . History of migraine 05/01/2012  . Hypothyroidism 05/01/2012  . History of renal calculi 05/01/2012  . Leukopenia 05/01/2012  . 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 Allergic rhinitis due to allergen 05/01/2012    Past Medical History:  Diagnosis Date  . Chronic kidney disease    kidney stones  . Depression   . Endometriosis   . Fibroid   . Fracture, tibial plateau   . GERD (gastroesophageal reflux disease)   . Heart murmur   . Hypothyroid   . Inflammatory arthritis 08/28/2016  . Insomnia 08/28/2016  . Migraine   . Osteoarthritis of both feet 08/28/2016  . Osteoarthritis of both hands 08/28/2016  . Osteoarthritis of both knees 08/28/2016  . 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 Sicca ([place]) 08/28/2016  . Vitamin D deficiency  08/28/2016    Past Surgical History:  Procedure Laterality Date  . LAPAROSCOPY  1994   endometriosis  . PELVIC LAPAROSCOPY  1993   d/t endometriosis--Dr. Matthew Saras    Current Outpatient Medications  Medication Sig Dispense Refill  . baclofen (LIORESAL) 10 MG tablet TAKE 1 TABLET BY MOUTH 3 TIMES DAILY AS NEEDED FOR MUSCLE PAIN 90 tablet 1  . buPROPion (WELLBUTRIN SR) 200 MG 12 hr tablet Take 300 mg every other day by mouth.   1  . cholecalciferol (VITAMIN D) 1000 units tablet Take 2,000 Units by mouth daily.    . Diclofenac Sodium (PENNSAID) 2 % SOLN Place 2 application onto the skin 2 (two) times daily. 112 g 3  . Krill Oil 500 MG CAPS Take 750 mg by  mouth.    . levothyroxine (SYNTHROID, LEVOTHROID) 75 MCG tablet Take 1 tablet (75 mcg total) by mouth daily. 90 tablet 1  . pantoprazole (PROTONIX) 40 MG tablet Take 1 tablet (40 mg total) by mouth daily. 90 tablet 1  . Probiotic Product (PROBIOTIC-10 PO) Take by mouth.     No current facility-administered medications for this visit.      ALLERGIES: Compazine [prochlorperazine edisylate]  Family History  Problem Relation Age of Onset  . Hypertension Mother   . Dementia Mother   . Hypertension Father   . Stroke Father   . Heart disease Father   . Hypertension Brother   . Hypertension Maternal Grandmother   . Hypertension Maternal Grandfather   . Kidney failure Maternal Grandfather   . Aneurysm Maternal Grandfather   . Cancer Maternal Aunt 42       breast  . Aneurysm Maternal Aunt   . Cancer Cousin        breast    Social History   Socioeconomic History  . Marital status: Married    Spouse name: Not on file  . Number of children: 2  . Years of education: Not on file  . Highest education level: Not on file  Occupational History  . Occupation: Programmer, multimedia: [employer]  . Financial resource strain: Not on file  . Food insecurity:    Worry: Not on file    Inability: Not on file  . Transportation needs:    Medical: Not on file    Non-medical: Not on file  Tobacco Use  . Smoking status: Never Smoker  . Smokeless tobacco: Never Used  Substance and Sexual Activity  . Alcohol use: No  . Drug use: No  . Sexual activity: Yes    Partners: Male    Birth control/protection: None  Lifestyle  . Physical activity:    Days per week: Not on file    Minutes per session: Not on file  . Stress: Not on file  Relationships  . Social connections:    Talks on phone: Not on file    Gets together: Not on file    Attends religious service: Not on file    Active member of club or organization: Not on file    Attends meetings of clubs or organizations: Not on  file    Relationship status: Not on file  . Intimate partner violence:    Fear of current or ex partner: Not on file    Emotionally abused: Not on file    Physically abused: Not on file    Forced sexual activity: Not on file  Other Topics Concern  . Not on file  Social History  Narrative  . Not on file    Review of Systems  Constitutional: Negative.   HENT: Negative.   Eyes: Negative.   Respiratory: Negative.   Cardiovascular: Negative.   Gastrointestinal: Negative.   Endocrine: Negative.   Genitourinary:       Vaginal dryness  Musculoskeletal: Negative.   Skin: Negative.   Allergic/Immunologic: Negative.   Neurological: Negative.   Hematological: Negative.   Psychiatric/Behavioral: Negative.   All other systems reviewed and are negative.   PHYSICAL EXAMINATION:    BP 138/72   Pulse 68   Ht 5' 5.25" (1.657 m)   Wt 158 lb (71.7 kg)   LMP 02/22/2014   BMI 26.09 kg/m     General appearance: alert, cooperative and appears stated age Heart: regular rate and rhythm Abdomen: soft, non-tender, no masses,  no organomegaly Extremities: extremities normal, atraumatic, no cyanosis or edema No abnormal inguinal nodes palpated Neurologic: Grossly normal  Pelvic: External genitalia:  no lesions              Urethra:  normal appearing urethra with no masses, tenderness or lesions              Bartholins and Skenes: normal                 Vagina: normal appearing vagina with normal color and discharge, no lesions              Cervix: no lesions                Bimanual Exam:  Uterus:  normal size, contour, position, consistency, mobility, non-tender              Adnexa: no mass, fullness, tenderness                Chaperone was present for exam.  ASSESSMENT  Vaginal atrophy.  Postmenopausal female.  Status post endometrial ablation.  FH dementia.  PLAN  We discussed vaginal atrophy.  Will treat with Estring.  Instructed in used.  I discussed potential effect on breast  cancer.  We reviewed HRT - WHI study and risks of stroke, DVT, PE, MI, breast cancer and dementia.  Will not initiate HRT.  I did review alternatives to HRT- Neurontin, SSRI, SNRI.  FU in 6 weeks.    An After Visit Summary was printed and given to the patient.  ___30___ minutes face to face time of which over 50% was spent in counseling.

## 2018-08-21 NOTE — Progress Notes (Signed)
Office Visit Note  Patient: Shelly [name]             Date of Birth: 06-13-1961           MRN: [id number]             PCP: Lanice Shirts, MD Referring: Lanice Shirts, * Visit Date: 09/04/2018 Occupation: @GUAROCC @  Subjective:  Bilateral elbow joint pain   History of Present Illness: Shelly [name] is a 57 y.o. female with history of osteoarthritis. She has no joint pain or joint swelling at this time.  She has no morning stiffness.  She continues to have bilateral cubital tunnel syndrome, which has been worsening over the past 1-2 months.  She has tenderness of bilateral medial epicondyles.  She denies any overuse activities.  She uses voltaren gel PRN.  She states she is no longer following a gluten free diet and she has gained 5lbs and feels like this may be contributing to some of the inflammation she is experiencing.  She reports both knee joints have been doing well.  She continues to see Dr. Hulan Saas every 3 months for adjustments.  She reports she no longer has neck pain or radiculopathy, and her ROM has improved significantly. She reports she has recurrent rash on the left forearm, but she cannot identify a trigger. She continues to have dryness but no mouth dryness. She no longer uses restasis. She has no symptoms of Raynaud's.  She has had a nose sore but no mouth sores.    Activities of Daily Living:  Patient reports morning stiffness for 0 minutes.   Patient Denies nocturnal pain.  Difficulty dressing/grooming: Denies Difficulty climbing stairs: Denies Difficulty getting out of chair: Denies Difficulty using hands for taps, buttons, cutlery, and/or writing: Denies  Review of Systems  Constitutional: Negative for fatigue.  HENT: Positive for nose dryness. Negative for mouth sores and mouth dryness.   Eyes: Positive for dryness. Negative for pain and visual disturbance.  Respiratory: Negative for cough, hemoptysis, shortness of breath and difficulty  breathing.   Cardiovascular: Negative for chest pain, palpitations, hypertension and swelling in legs/feet.  Gastrointestinal: Positive for constipation. Negative for blood in stool and diarrhea.  Endocrine: Negative for increased urination.  Genitourinary: Negative for painful urination.  Musculoskeletal: Positive for arthralgias and joint pain. Negative for joint swelling, myalgias, muscle weakness, morning stiffness, muscle tenderness and myalgias.  Skin: Negative for color change, pallor, rash, hair loss, nodules/bumps, skin tightness, ulcers and sensitivity to sunlight.  Allergic/Immunologic: Negative for susceptible to infections.  Neurological: Negative for dizziness, numbness, headaches and weakness.  Hematological: Negative for swollen glands.  Psychiatric/Behavioral: Negative for depressed mood and sleep disturbance. The patient is not nervous/anxious.     PMFS History:  Patient Active Problem List   Diagnosis Date Noted  . Nonallopathic lesion of rib cage 07/08/2018  . Nonallopathic lesion of sacral region 07/08/2018  . 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 Osteoarthritis of both hands 08/28/2016  . Osteoarthritis of both feet 08/28/2016  . Osteoarthritis of both knees 08/28/2016  . Insomnia 08/28/2016  . 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 Vitamin D deficiency 08/28/2016  . Trapezius muscle spasm 08/28/2016  . Chronic neck pain 03/01/2016  . Scapular dyskinesis 03/01/2016  . Nonallopathic lesion of cervical region 03/01/2016  . Nonallopathic lesion of thoracic region 03/01/2016  . Nonallopathic lesion of lumbosacral region 03/01/2016  . DUB (dysfunctional uterine bleeding) 01/05/2014  . Family history of abdominal aortic aneurysm 08/12/2012  .  Renal calculus, left 06/04/2012  . Irritability 05/01/2012  . GERD (gastroesophageal reflux disease) 05/01/2012  . History of migraine 05/01/2012  . Hypothyroidism 05/01/2012  . History of renal calculi  05/01/2012  . Leukopenia 05/01/2012  . 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 Allergic rhinitis due to allergen 05/01/2012    Past Medical History:  Diagnosis Date  . Chronic kidney disease    kidney stones  . Depression   . Endometriosis   . Fibroid   . Fracture, tibial plateau   . GERD (gastroesophageal reflux disease)   . Heart murmur   . Hypothyroid   . Inflammatory arthritis 08/28/2016  . Insomnia 08/28/2016  . Migraine   . Osteoarthritis of both feet 08/28/2016  . Osteoarthritis of both hands 08/28/2016  . Osteoarthritis of both knees 08/28/2016  . 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 Sicca ([place]) 08/28/2016  . Vitamin D deficiency 08/28/2016    Family History  Problem Relation Age of Onset  . Hypertension Mother   . Dementia Mother   . Hypertension Father   . Stroke Father   . Heart disease Father   . Hypertension Brother   . Hypertension Maternal Grandmother   . Hypertension Maternal Grandfather   . Kidney failure Maternal Grandfather   . Aneurysm Maternal Grandfather   . Cancer Maternal Aunt 42       breast  . Aneurysm Maternal Aunt   . Cancer Cousin        breast   Past Surgical History:  Procedure Laterality Date  . LAPAROSCOPY  1994   endometriosis  . PELVIC LAPAROSCOPY  1993   d/t endometriosis--Dr. Matthew Saras   Social History   Social History Narrative  . Not on file    Objective: Vital Signs: BP 130/86 (BP Location: Left Arm, Patient Position: Sitting, Cuff Size: Normal)   Pulse 62   Resp 14   Ht 5' 5.5" (1.664 m)   Wt 158 lb (71.7 kg)   LMP 02/22/2014   BMI 25.89 kg/m    Physical Exam  Constitutional: She is oriented to person, place, and time. She appears well-developed and well-nourished.  HENT:  Head: Normocephalic and atraumatic.  Nasal ulceration. No parotid swelling.   Eyes: Conjunctivae and EOM are normal.  Neck: Normal range of motion.  Cardiovascular: Normal rate, regular rhythm, normal heart sounds and intact distal pulses.    Pulmonary/Chest: Effort normal and breath sounds normal.  Abdominal: Soft. Bowel sounds are normal.  Lymphadenopathy:    She has no cervical adenopathy.  Neurological: She is alert and oriented to person, place, and time.  Skin: Skin is warm and dry. Capillary refill takes less than 2 seconds.  Mild facial flushing. No digital ulcerations or signs of gangrene. Hair thinning noted.   Psychiatric: She has a normal mood and affect. Her behavior is normal.  Nursing note and vitals reviewed.    Musculoskeletal Exam: C-spine, thoracic spine, and lumbar spine good ROM.  No midline spinal tenderness.  No SI joint tenderness.  Shoulder joints, elbow joints, wrist joints, MCPs and PIPs and DIPs good range of motion no synovitis.  She has complete fist formation bilaterally.  Medial epicondylitis bilaterally.  Hip joints, knee joints, ankle joints and MTPs and PIPs and DIPs good range of motion no synovitis.  No warmth or effusion bilateral knee joints.  No tenderness or swelling of ankle joints.  No tenderness of trochanteric bursa bilaterally.  CDAI Exam: CDAI Score: Not documented Patient Global Assessment: Not documented; Provider Global  Assessment: Not documented Swollen: Not documented; Tender: Not documented Joint Exam   Not documented   There is currently no information documented on the homunculus. Go to the Rheumatology activity and complete the homunculus joint exam.  Investigation: No additional findings.  Imaging: No results found.  Recent Labs: Lab Results  Component Value Date   WBC 3.2 (L) 03/04/2017   HGB 13.7 03/04/2017   PLT 268 03/04/2017   NA 141 03/04/2017   K 4.8 03/04/2017   CL 107 03/04/2017   CO2 25 03/04/2017   GLUCOSE 98 03/04/2017   BUN 16 03/04/2017   CREATININE 0.65 03/04/2017   BILITOT 0.4 03/04/2017   ALKPHOS 94 03/04/2017   AST 15 03/04/2017   ALT 16 03/04/2017   PROT 7.3 03/04/2017   ALBUMIN 4.5 03/04/2017   CALCIUM 9.2 03/04/2017   GFRAA >89  03/04/2017    Speciality Comments: No specialty comments available.  Procedures:  No procedures performed Allergies: Compazine [prochlorperazine edisylate]   Assessment / Plan:     Visit Diagnoses: Primary osteoarthritis of both hands: She has complete fist formation bilaterally.  She has no synovitis on exam.  She has no discomfort in her hands at this time.  Joint protection and muscle strengthening were discussed.  Primary osteoarthritis of both knees: No warmth or effusion.  She has good range of motion with no discomfort.  Primary osteoarthritis of both feet: She has no discomfort in her feet at this time.  She wears proper fitting shoes.   Cubital tunnel syndrome, bilateral: Chronic.  She has been having worsening symptoms for the past 1-2 months.  She uses Voltaren gel topically as needed.  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  She no longer uses Restasis.  She has no parotid swelling on exam.   Sore in nose: She has a nasal ulceration and nasal dryness.  She has had a nasal sore off and on for 1 year. She has no oral ulcerations.  We will order AVISE labs.   Facial flushing: She has mild facial flushing.  We will order AVISE labs.    Hair thinning: She has chronic hair thinning.  We will order AVISE labs.   History of vitamin D deficiency: She takes a calcium and vitamin D supplement.  Other medical conditions are listed as follows:   Other insomnia  History of migraine  History of hypothyroidism  History of gastroesophageal reflux (GERD)  History of renal calculi   Orders: No orders of the defined types were placed in this encounter.  No orders of the defined types were placed in this encounter.     Follow-Up Instructions: Return in about 1 year (around 09/05/2019) for Osteoarthritis, Sicca syndrome .   Ofilia Neas, PA-C   I examined and evaluated the patient with Hazel Sams PA.  Patient continues to  have some discomfort from osteoarthritis.  She had no synovitis on examination today.  She had facial erythema hair thinning, sicca symptoms, fatigue and history of nasal ulcers.  We will obtain AVISE labs today.  We will contact her when the lab results are available.  The plan of care was discussed as noted above.  Bo Merino, MD Note - This record has been created using Editor, commissioning.  Chart creation errors have been sought, but may not always  have been located. Such creation errors do not reflect on  the standard of medical care.

## 2018-09-04 ENCOUNTER — Encounter: Payer: Self-pay | Admitting: Rheumatology

## 2018-09-04 ENCOUNTER — Ambulatory Visit: Payer: 59 | Admitting: Rheumatology

## 2018-09-04 VITALS — BP 130/86 | HR 62 | Resp 14 | Ht 65.5 in | Wt 158.0 lb

## 2018-09-04 DIAGNOSIS — M19041 Primary osteoarthritis, right hand: Secondary | ICD-10-CM | POA: Diagnosis not present

## 2018-09-04 DIAGNOSIS — L659 Nonscarring hair loss, unspecified: Secondary | ICD-10-CM

## 2018-09-04 DIAGNOSIS — M19071 Primary osteoarthritis, right ankle and foot: Secondary | ICD-10-CM | POA: Diagnosis not present

## 2018-09-04 DIAGNOSIS — G5623 Lesion of ulnar nerve, bilateral upper limbs: Secondary | ICD-10-CM | POA: Diagnosis not present

## 2018-09-04 DIAGNOSIS — J3489 Other specified disorders of nose and nasal sinuses: Secondary | ICD-10-CM

## 2018-09-04 DIAGNOSIS — G4709 Other insomnia: Secondary | ICD-10-CM

## 2018-09-04 DIAGNOSIS — Z87442 Personal history of urinary calculi: Secondary | ICD-10-CM

## 2018-09-04 DIAGNOSIS — R232 Flushing: Secondary | ICD-10-CM | POA: Diagnosis not present

## 2018-09-04 DIAGNOSIS — M170 Bilateral primary osteoarthritis of knee: Secondary | ICD-10-CM | POA: Diagnosis not present

## 2018-09-04 DIAGNOSIS — M3501 Sicca syndrome with keratoconjunctivitis: Secondary | ICD-10-CM | POA: Diagnosis not present

## 2018-09-04 DIAGNOSIS — Z8719 Personal history of other diseases of the digestive system: Secondary | ICD-10-CM

## 2018-09-04 DIAGNOSIS — Z8639 Personal history of other endocrine, nutritional and metabolic disease: Secondary | ICD-10-CM

## 2018-09-04 DIAGNOSIS — M19072 Primary osteoarthritis, left ankle and foot: Secondary | ICD-10-CM

## 2018-09-04 DIAGNOSIS — Z8669 Personal history of other diseases of the nervous system and sense organs: Secondary | ICD-10-CM

## 2018-09-04 DIAGNOSIS — M19042 Primary osteoarthritis, left hand: Secondary | ICD-10-CM

## 2018-09-07 NOTE — Progress Notes (Signed)
GYNECOLOGY  VISIT   HPI: 57 y.o.   Married  Caucasian  female   [id number] with Patient's last menstrual period was 02/22/2014. here for 6 week follow up after starting Estring. Patient states she feels less vaginal dryness.  Intercourse improved with Estring.  Used a cooking oil for lubrication.   Ring is comfortable.   GYNECOLOGIC HISTORY: Patient's last menstrual period was 02/22/2014. Contraception:  Postmenopausal Menopausal hormone therapy: Estring Last mammogram:   12/17/2017 BI-RADS category: 1: Negative Last pap smear: 04-06-18 Neg:Neg HR HPV, 01-10-15 Neg:Neg HR HPV        OB History    Gravida  0   Para  0   Term  0   Preterm  0   AB  0   Living  0     SAB  0   TAB  0   Ectopic  0   Multiple  0   Live Births           Obstetric Comments  Has 2 adopted children           Patient Active Problem List   Diagnosis Date Noted  . Nonallopathic lesion of rib cage 07/08/2018  . Nonallopathic lesion of sacral region 07/08/2018  . 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 Osteoarthritis of both hands 08/28/2016  . Osteoarthritis of both feet 08/28/2016  . Osteoarthritis of both knees 08/28/2016  . Insomnia 08/28/2016  . 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 Vitamin D deficiency 08/28/2016  . Trapezius muscle spasm 08/28/2016  . Chronic neck pain 03/01/2016  . Scapular dyskinesis 03/01/2016  . Nonallopathic lesion of cervical region 03/01/2016  . Nonallopathic lesion of thoracic region 03/01/2016  . Nonallopathic lesion of lumbosacral region 03/01/2016  . DUB (dysfunctional uterine bleeding) 01/05/2014  . Family history of abdominal aortic aneurysm 08/12/2012  . Renal calculus, left 06/04/2012  . Irritability 05/01/2012  . GERD (gastroesophageal reflux disease) 05/01/2012  . History of migraine 05/01/2012  . Hypothyroidism 05/01/2012  . History of renal calculi 05/01/2012  . Leukopenia 05/01/2012  . Ulnar nerve entrapment at elbow  05/01/2012  . Allergic rhinitis due to allergen 05/01/2012    Past Medical History:  Diagnosis Date  . Chronic kidney disease    kidney stones  . Depression   . Endometriosis   . Fibroid   . Fracture, tibial plateau   . GERD (gastroesophageal reflux disease)   . Heart murmur   . Hypothyroid   . Inflammatory arthritis 08/28/2016  . Insomnia 08/28/2016  . Migraine   . Osteoarthritis of both feet 08/28/2016  . Osteoarthritis of both hands 08/28/2016  . Osteoarthritis of both knees 08/28/2016  . 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 Sicca ([place]) 08/28/2016  . Vitamin D deficiency 08/28/2016    Past Surgical History:  Procedure Laterality Date  . LAPAROSCOPY  1994   endometriosis  . PELVIC LAPAROSCOPY  1993   d/t endometriosis--Dr. Matthew Saras    Current Outpatient Medications  Medication Sig Dispense Refill  . b complex vitamins capsule Take 1 capsule by mouth daily.    . baclofen (LIORESAL) 10 MG tablet TAKE 1 TABLET BY MOUTH 3 TIMES DAILY AS NEEDED FOR MUSCLE PAIN 90 tablet 1  . Calcium Carbonate-Vitamin D (CALTRATE 600+D PO) Take by mouth daily.    . cholecalciferol (VITAMIN D) 1000 units tablet Take 4,000 Units by mouth daily.     . Diclofenac Sodium (PENNSAID) 2 % SOLN Place 2 application onto the skin 2 (two) times daily. 112 g  3  . estradiol (ESTRING) 2 MG vaginal ring Place 2 mg vaginally every 3 (three) months. Insert a new ring into vagina every 3 months 1 each 0  . Krill Oil 500 MG CAPS Take 750 mg by mouth.    . levothyroxine (SYNTHROID, LEVOTHROID) 75 MCG tablet Take 1 tablet (75 mcg total) by mouth daily. 90 tablet 1   No current facility-administered medications for this visit.      ALLERGIES: Compazine [prochlorperazine edisylate]  Family History  Problem Relation Age of Onset  . Hypertension Mother   . Dementia Mother   . Hypertension Father   . Stroke Father   . Heart disease Father   . Hypertension Brother   . Hypertension Maternal Grandmother   . Hypertension  Maternal Grandfather   . Kidney failure Maternal Grandfather   . Aneurysm Maternal Grandfather   . Cancer Maternal Aunt 42       breast  . Aneurysm Maternal Aunt   . Cancer Cousin        breast    Social History   Socioeconomic History  . Marital status: Married    Spouse name: Not on file  . Number of children: 2  . Years of education: Not on file  . Highest education level: Not on file  Occupational History  . Occupation: Programmer, multimedia: [employer]  . Financial resource strain: Not on file  . Food insecurity:    Worry: Not on file    Inability: Not on file  . Transportation needs:    Medical: Not on file    Non-medical: Not on file  Tobacco Use  . Smoking status: Never Smoker  . Smokeless tobacco: Never Used  Substance and Sexual Activity  . Alcohol use: No  . Drug use: No  . Sexual activity: Yes    Partners: Male    Birth control/protection: None  Lifestyle  . Physical activity:    Days per week: Not on file    Minutes per session: Not on file  . Stress: Not on file  Relationships  . Social connections:    Talks on phone: Not on file    Gets together: Not on file    Attends religious service: Not on file    Active member of club or organization: Not on file    Attends meetings of clubs or organizations: Not on file    Relationship status: Not on file  . Intimate partner violence:    Fear of current or ex partner: Not on file    Emotionally abused: Not on file    Physically abused: Not on file    Forced sexual activity: Not on file  Other Topics Concern  . Not on file  Social History Narrative  . Not on file    Review of Systems  All other systems reviewed and are negative.   PHYSICAL EXAMINATION:    BP 122/74 (BP Location: Right Arm, Patient Position: Sitting, Cuff Size: Normal)   Pulse 66   Ht 5' 2.5" (1.588 m)   Wt 159 lb 9.6 oz (72.4 kg)   LMP 02/22/2014   BMI 28.73 kg/m     General appearance: alert, cooperative and  appears stated age    Pelvic: External genitalia:  no lesions              Urethra:  normal appearing urethra with no masses, tenderness or lesions  Bartholins and Skenes: normal                 Vagina: normal appearing vagina with normal color and discharge, no lesions              Cervix: no lesions                Bimanual Exam:  Uterus:  normal size, contour, position, consistency, mobility, non-tender              Adnexa: no mass, fullness, tenderness            Estring removed, rinsed with water and replaced.   Chaperone was present for exam.  ASSESSMENT  Vaginal atrophy.  Estring is working well.   PLAN  Continue Estring.  Keep up to date on mammogram yearly.  Discussed potential effect on breast cancer.  Refills until annual exam is due in June 2020.     An After Visit Summary was printed and given to the patient.  __15____ minutes face to face time of which over 50% was spent in counseling.

## 2018-09-09 ENCOUNTER — Encounter: Payer: Self-pay | Admitting: Obstetrics and Gynecology

## 2018-09-09 ENCOUNTER — Ambulatory Visit: Payer: 59 | Admitting: Obstetrics and Gynecology

## 2018-09-09 ENCOUNTER — Other Ambulatory Visit: Payer: Self-pay

## 2018-09-09 VITALS — BP 122/74 | HR 66 | Ht 62.5 in | Wt 159.6 lb

## 2018-09-09 DIAGNOSIS — N952 Postmenopausal atrophic vaginitis: Secondary | ICD-10-CM

## 2018-09-09 MED ORDER — ESTRADIOL 2 MG VA RING: 2.0000 mg | VAGINAL_RING | VAGINAL | 1 refills | Status: DC

## 2018-09-09 NOTE — Patient Instructions (Signed)

## 2018-09-17 ENCOUNTER — Telehealth: Payer: Self-pay | Admitting: *Deleted

## 2018-09-17 NOTE — Telephone Encounter (Signed)
Attempted to contact the patient and left message for  patient to call the office. Calling to Advise patient AVISE labs are negative.

## 2018-09-17 NOTE — Telephone Encounter (Signed)
Patient advised AVISE labs are negative. Patient verbalized understanding.

## 2018-10-07 ENCOUNTER — Ambulatory Visit (INDEPENDENT_AMBULATORY_CARE_PROVIDER_SITE_OTHER): Payer: 59 | Admitting: Family Medicine

## 2018-10-07 ENCOUNTER — Encounter: Payer: Self-pay | Admitting: Family Medicine

## 2018-10-07 VITALS — BP 128/90 | HR 72 | Ht 62.5 in | Wt 158.0 lb

## 2018-10-07 DIAGNOSIS — G2589 Other specified extrapyramidal and movement disorders: Secondary | ICD-10-CM | POA: Diagnosis not present

## 2018-10-07 DIAGNOSIS — G5620 Lesion of ulnar nerve, unspecified upper limb: Secondary | ICD-10-CM

## 2018-10-07 DIAGNOSIS — M999 Biomechanical lesion, unspecified: Secondary | ICD-10-CM | POA: Diagnosis not present

## 2018-10-07 NOTE — Progress Notes (Signed)
[name] [place], [place] [address] Phone: [phone] Subjective:      CC: Upper back pain follow-up new elbow pain  [id number]  Shelly [name] is a 57 y.o. female coming in with complaint of upper back pain. She said that she needs an adjustment.   Is having elbow pain, right worse than left. Does have cubital tunnel syndrome she states. Does notice an increase in her pain with elbow flexion. Does have numbness and tingling in her hands, worse in 4th and 5th finger left hand. Notices an increase in her pain with chopping with the right hand.   Patient states that overall has been more stressed.  Taking care of her parents.  In addition to this her parents and multiple rental properties and is dealing with those as well.  Patient states that if she can find some time to deal with that she would probably do better.  He notices that this is causing her neck pain to be worse.  Grinding her teeth at night.     Past Medical History:  Diagnosis Date  . Chronic kidney disease    kidney stones  . Depression   . Endometriosis   . Fibroid   . Fracture, tibial plateau   . GERD (gastroesophageal reflux disease)   . Heart murmur   . Hypothyroid   . Inflammatory arthritis 08/28/2016  . Insomnia 08/28/2016  . Migraine   . Osteoarthritis of both feet 08/28/2016  . Osteoarthritis of both hands 08/28/2016  . Osteoarthritis of both knees 08/28/2016  . 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 Sicca ([place]) 08/28/2016  . Vitamin D deficiency 08/28/2016   Past Surgical History:  Procedure Laterality Date  . LAPAROSCOPY  1994   endometriosis  . PELVIC LAPAROSCOPY  1993   d/t endometriosis--Dr. Matthew Saras   Social History   Socioeconomic History  . Marital status: Married    Spouse name: Not on file  . Number of children: 2  . Years of education: Not on file  . Highest education level: Not on file  Occupational History  . Occupation: Programmer, multimedia:  [employer]  . Financial resource strain: Not on file  . Food insecurity:    Worry: Not on file    Inability: Not on file  . Transportation needs:    Medical: Not on file    Non-medical: Not on file  Tobacco Use  . Smoking status: Never Smoker  . Smokeless tobacco: Never Used  Substance and Sexual Activity  . Alcohol use: No  . Drug use: No  . Sexual activity: Yes    Partners: Male    Birth control/protection: None  Lifestyle  . Physical activity:    Days per week: Not on file    Minutes per session: Not on file  . Stress: Not on file  Relationships  . Social connections:    Talks on phone: Not on file    Gets together: Not on file    Attends religious service: Not on file    Active member of club or organization: Not on file    Attends meetings of clubs or organizations: Not on file    Relationship status: Not on file  Other Topics Concern  . Not on file  Social History Narrative  . Not on file   Allergies  Allergen Reactions  . Compazine [Prochlorperazine Edisylate] Other (See Comments)    Muscle contractions   Family History  Problem Relation Age of Onset  . Hypertension Mother   . Dementia Mother   . Hypertension Father   . Stroke Father   . Heart disease Father   . Hypertension Brother   . Hypertension Maternal Grandmother   . Hypertension Maternal Grandfather   . Kidney failure Maternal Grandfather   . Aneurysm Maternal Grandfather   . Cancer Maternal Aunt 42       breast  . Aneurysm Maternal Aunt   . Cancer Cousin        breast    Current Outpatient Medications (Endocrine & Metabolic):  .  levothyroxine (SYNTHROID, LEVOTHROID) 75 MCG tablet, Take 1 tablet (75 mcg total) by mouth daily.      Current Outpatient Medications (Other):  .  b complex vitamins capsule, Take 1 capsule by mouth daily. .  baclofen (LIORESAL) 10 MG tablet, TAKE 1 TABLET BY MOUTH 3 TIMES DAILY AS NEEDED FOR MUSCLE PAIN .  Calcium Carbonate-Vitamin D  (CALTRATE 600+D PO), Take by mouth daily. .  cholecalciferol (VITAMIN D) 1000 units tablet, Take 4,000 Units by mouth daily.  .  Diclofenac Sodium (PENNSAID) 2 % SOLN, Place 2 application onto the skin 2 (two) times daily. Marland Kitchen  estradiol (ESTRING) 2 MG vaginal ring, Place 2 mg vaginally every 3 (three) months. Insert a new ring into vagina every 3 months .  Krill Oil 500 MG CAPS, Take 750 mg by mouth.    Past medical history, social, surgical and family history all reviewed in electronic medical record.  No pertanent information unless stated regarding to the chief complaint.   Review of Systems:  No headache, visual changes, nausea, vomiting, diarrhea, constipation, dizziness, abdominal pain, skin rash, fevers, chills, night sweats, weight loss, swollen lymph nodes, body aches, joint swelling, , chest pain, shortness of breath, mood changes.  Positive muscle aches  Objective  Blood pressure 128/90, pulse 72, height 5' 2.5" (1.588 m), weight 158 lb (71.7 kg), last menstrual period 02/22/2014, SpO2 99 %.    General: No apparent distress alert and oriented x3 mood and affect normal, dressed appropriately.  HEENT: Pupils equal, extraocular movements intact  Respiratory: Patient's speak in full sentences and does not appear short of breath  Cardiovascular: No lower extremity edema, non tender, no erythema  Skin: Warm dry intact with no signs of infection or rash on extremities or on axial skeleton.  Abdomen: Soft nontender  Neuro: Cranial nerves II through XII are intact, neurovascularly intact in all extremities with 2+ DTRs and 2+ pulses.  Lymph: No lymphadenopathy of posterior or anterior cervical chain or axillae bilaterally.  Gait normal with good balance and coordination.  MSK:  Non tender with full range of motion and good stability and symmetric strength and tone of shoulders, , wrist, hip, knee and ankles bilaterally.  Bilateral upper extremity positive Tinel over the cubital tunnel.   Full strength of the hands noted.  Deep tendon reflexes intact.  Neck pain is noted with paraspinal musculature tenderness.  Patient does have some limited range of motion with sidebending and rotation right greater than left.  Negative Spurling's are noted.  Osteopathic findings C2 flexed rotated and side bent right C6 flexed rotated and side bent left T3 extended rotated and side bent right inhaled third rib T7 extended rotated and side bent left L3  flexed rotated and side bent right Sacrum right on right     Impression and Recommendations:     This case required medical decision making of moderate complexity. The  above documentation has been reviewed and is accurate and complete [name], DO       Note: This dictation was prepared with Dragon dictation along with smaller phrase technology. Any transcriptional errors that result from this process are unintentional.

## 2018-10-07 NOTE — Patient Instructions (Signed)
Good to see you  Shelly [name] is your friend Find time for yourself.  Stay active  Compression sleeve to elbows with a lot of activity  Avoid full extension of elbows when possible See me again in 4-6 weeks Happy holidays!

## 2018-10-07 NOTE — Assessment & Plan Note (Signed)
Bilateral.  Discussed compression, which activities a different shortness avoid.  Follow-up again in 4 to 6 weeks worsening pain consider injection or physical therapy

## 2018-10-07 NOTE — Assessment & Plan Note (Signed)
Decision today to treat with OMT was based on Physical Exam  After verbal consent patient was treated with HVLA, ME, FPR techniques in cervical, thoracic, rib, lumbar and sacral areas  Patient tolerated the procedure well with improvement in symptoms  Patient given exercises, stretches and lifestyle modifications  See medications in patient instructions if given  Patient will follow up in 4-6 weeks 

## 2018-10-07 NOTE — Assessment & Plan Note (Signed)
Continues to have some dyskinesis.  Discussed posture and ergonomics.  Patient does have a significant amount of tightness at this point.  Discussed which activities and treatment which wants to avoid.  Follow-up again in 4 to 8 weeks

## 2018-10-15 MED FILL — LEVOTHYROXINE 75 MCG TABLET: 75 | 90 days supply | Qty: 90 | Fill #1

## 2018-11-03 NOTE — Progress Notes (Signed)
[name] [place], [place] [address] Phone: [phone] Subjective:    I Shelly [name] am serving as a Education administrator for Dr. Hulan Saas.    CC: Back pain follow-up  [id number]  Shelly [name] is a 58 y.o. female coming in with complaint of back pain. States that her back is doing ok.  No new symptoms.  Just more intermittent discomfort and tightness.  Patient has been doing more work around the house.  Has been doing a little bit more lifting recently.      Past Medical History:  Diagnosis Date  . Chronic kidney disease    kidney stones  . Depression   . Endometriosis   . Fibroid   . Fracture, tibial plateau   . GERD (gastroesophageal reflux disease)   . Heart murmur   . Hypothyroid   . Inflammatory arthritis 08/28/2016  . Insomnia 08/28/2016  . Migraine   . Osteoarthritis of both feet 08/28/2016  . Osteoarthritis of both hands 08/28/2016  . Osteoarthritis of both knees 08/28/2016  . 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 Sicca ([place]) 08/28/2016  . Vitamin D deficiency 08/28/2016   Past Surgical History:  Procedure Laterality Date  . LAPAROSCOPY  1994   endometriosis  . PELVIC LAPAROSCOPY  1993   d/t endometriosis--Dr. Matthew Saras   Social History   Socioeconomic History  . Marital status: Married    Spouse name: Not on file  . Number of children: 2  . Years of education: Not on file  . Highest education level: Not on file  Occupational History  . Occupation: Programmer, multimedia: [employer]  . Financial resource strain: Not on file  . Food insecurity:    Worry: Not on file    Inability: Not on file  . Transportation needs:    Medical: Not on file    Non-medical: Not on file  Tobacco Use  . Smoking status: Never Smoker  . Smokeless tobacco: Never Used  Substance and Sexual Activity  . Alcohol use: No  . Drug use: No  . Sexual activity: Yes    Partners: Male    Birth control/protection: None  Lifestyle  .  Physical activity:    Days per week: Not on file    Minutes per session: Not on file  . Stress: Not on file  Relationships  . Social connections:    Talks on phone: Not on file    Gets together: Not on file    Attends religious service: Not on file    Active member of club or organization: Not on file    Attends meetings of clubs or organizations: Not on file    Relationship status: Not on file  Other Topics Concern  . Not on file  Social History Narrative  . Not on file   Allergies  Allergen Reactions  . Compazine [Prochlorperazine Edisylate] Other (See Comments)    Muscle contractions   Family History  Problem Relation Age of Onset  . Hypertension Mother   . Dementia Mother   . Hypertension Father   . Stroke Father   . Heart disease Father   . Hypertension Brother   . Hypertension Maternal Grandmother   . Hypertension Maternal Grandfather   . Kidney failure Maternal Grandfather   . Aneurysm Maternal Grandfather   . Cancer Maternal Aunt 42       breast  . Aneurysm Maternal Aunt   . Cancer Cousin  breast    Current Outpatient Medications (Endocrine & Metabolic):  .  levothyroxine (SYNTHROID, LEVOTHROID) 75 MCG tablet, Take 1 tablet (75 mcg total) by mouth daily.      Current Outpatient Medications (Other):  .  b complex vitamins capsule, Take 1 capsule by mouth daily. .  baclofen (LIORESAL) 10 MG tablet, TAKE 1 TABLET BY MOUTH 3 TIMES DAILY AS NEEDED FOR MUSCLE PAIN .  Calcium Carbonate-Vitamin D (CALTRATE 600+D PO), Take by mouth daily. .  cholecalciferol (VITAMIN D) 1000 units tablet, Take 4,000 Units by mouth daily.  .  Diclofenac Sodium (PENNSAID) 2 % SOLN, Place 2 application onto the skin 2 (two) times daily. Marland Kitchen  estradiol (ESTRING) 2 MG vaginal ring, Place 2 mg vaginally every 3 (three) months. Insert a new ring into vagina every 3 months .  Krill Oil 500 MG CAPS, Take 750 mg by mouth.    Past medical history, social, surgical and family history  all reviewed in electronic medical record.  No pertanent information unless stated regarding to the chief complaint.   Review of Systems:  No headache, visual changes, nausea, vomiting, diarrhea, constipation, dizziness, abdominal pain, skin rash, fevers, chills, night sweats, weight loss, swollen lymph nodes, body aches, joint swelling, muscle aches, chest pain, shortness of breath, mood changes.   Objective  Last menstrual period 02/22/2014.    General: No apparent distress alert and oriented x3 mood and affect normal, dressed appropriately.  HEENT: Pupils equal, extraocular movements intact  Respiratory: Patient's speak in full sentences and does not appear short of breath  Cardiovascular: No lower extremity edema, non tender, no erythema  Skin: Warm dry intact with no signs of infection or rash on extremities or on axial skeleton.  Abdomen: Soft nontender  Neuro: Cranial nerves II through XII are intact, neurovascularly intact in all extremities with 2+ DTRs and 2+ pulses.  Lymph: No lymphadenopathy of posterior or anterior cervical chain or axillae bilaterally.  Gait normal with good balance and coordination.  MSK:  Non tender with full range of motion and good stability and symmetric strength and tone of shoulders, elbows, wrist, hip, knee and ankles bilaterally.  Back exam shows the patient does have some mild increase in kyphosis of the upper back and some mild loss of lordosis of the lower back.  Some tightness with Corky Sox test.  Some tightness in the thoracolumbar juncture on the right side greater than left.  No radiation, no numbness or weakness.  Osteopathic findings C2 flexed rotated and side bent right C6 flexed rotated and side bent left T2 extended rotated and side bent left inhaled rib T7 extended rotated and side bent left L3 flexed rotated and side bent right Sacrum right on right    Impression and Recommendations:     This case required medical decision making of  moderate complexity. The above documentation has been reviewed and is accurate and complete [name], DO       Note: This dictation was prepared with Dragon dictation along with smaller phrase technology. Any transcriptional errors that result from this process are unintentional.

## 2018-11-04 ENCOUNTER — Ambulatory Visit: Payer: 59 | Admitting: Family Medicine

## 2018-11-04 ENCOUNTER — Encounter: Payer: Self-pay | Admitting: Family Medicine

## 2018-11-04 VITALS — BP 122/74 | HR 54 | Ht 62.5 in | Wt 160.0 lb

## 2018-11-04 DIAGNOSIS — M999 Biomechanical lesion, unspecified: Secondary | ICD-10-CM

## 2018-11-04 DIAGNOSIS — G2589 Other specified extrapyramidal and movement disorders: Secondary | ICD-10-CM | POA: Diagnosis not present

## 2018-11-04 NOTE — Patient Instructions (Signed)
Overall not bad  Keep working on posture See me again in 6ish weeks

## 2018-11-04 NOTE — Assessment & Plan Note (Signed)
Scapular dyskinesis.  Discussed icing regimen and home exercise.  Discussed which activities to do which was to avoid.  Increase activity slowly over the course of several days or weeks.  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  Follow-up again in 4 to 8 weeks

## 2018-11-04 NOTE — Assessment & Plan Note (Signed)
Decision today to treat with OMT was based on Physical Exam  After verbal consent patient was treated with HVLA, ME, FPR techniques in cervical, thoracic, rib,  lumbar and sacral areas  Patient tolerated the procedure well with improvement in symptoms  Patient given exercises, stretches and lifestyle modifications  See medications in patient instructions if given  Patient will follow up in 4-8 weeks 

## 2018-11-11 MED FILL — ESTRING 2 MG VAGINAL RING: 2 | 90 days supply | Qty: 1 | Fill #0

## 2018-11-26 ENCOUNTER — Other Ambulatory Visit: Payer: Self-pay | Admitting: Internal Medicine

## 2018-11-26 DIAGNOSIS — Z1231 Encounter for screening mammogram for malignant neoplasm of breast: Secondary | ICD-10-CM

## 2018-12-31 ENCOUNTER — Ambulatory Visit
Admission: RE | Admit: 2018-12-31 | Discharge: 2018-12-31 | Disposition: A | Discharge status: Home / Self Care | Payer: 59 | Source: Ambulatory Visit | Attending: Internal Medicine | Admitting: Internal Medicine

## 2018-12-31 DIAGNOSIS — H524 Presbyopia: Secondary | ICD-10-CM | POA: Diagnosis not present

## 2018-12-31 DIAGNOSIS — Z1231 Encounter for screening mammogram for malignant neoplasm of breast: Secondary | ICD-10-CM | POA: Diagnosis not present

## 2018-12-31 DIAGNOSIS — H52222 Regular astigmatism, left eye: Secondary | ICD-10-CM | POA: Diagnosis not present

## 2019-01-06 ENCOUNTER — Ambulatory Visit: Payer: 59 | Admitting: Family Medicine

## 2019-01-06 ENCOUNTER — Other Ambulatory Visit: Payer: Self-pay

## 2019-01-06 ENCOUNTER — Encounter: Payer: Self-pay | Admitting: Family Medicine

## 2019-01-06 DIAGNOSIS — G2589 Other specified extrapyramidal and movement disorders: Secondary | ICD-10-CM

## 2019-01-06 DIAGNOSIS — M999 Biomechanical lesion, unspecified: Secondary | ICD-10-CM

## 2019-01-06 NOTE — Assessment & Plan Note (Signed)
Do believe the patient has more of a scapular dyskinesis is contributing to more the neck pain.  Discussed with patient in great length about home exercise, icing regimen, which activities to do which wants to avoid.  Patient is to increase activity slowly over the course of next several weeks.  Patient will follow-up with me again in 4 to 8 weeks

## 2019-01-06 NOTE — Patient Instructions (Signed)
God to see you  Ice is your friend Continue to watch the elbows.  6 weeks Shelly [name]

## 2019-01-06 NOTE — Assessment & Plan Note (Signed)
Decision today to treat with OMT was based on Physical Exam  After verbal consent patient was treated with HVLA, ME, FPR techniques in cervical, thoracic, rib lumbar and sacral areas  Patient tolerated the procedure well with improvement in symptoms  Patient given exercises, stretches and lifestyle modifications  See medications in patient instructions if given  Patient will follow up in 4-8 weeks 

## 2019-01-06 NOTE — Progress Notes (Signed)
[name] [address] [place], [place] [address] Phone: [phone] Subjective:   [name], am serving as a scribe for Dr. Hulan Saas. CC: Neck pain  MCN:[id number]  Shelly [name] is a 58 y.o. female coming in with complaint of neck pain. No new complaints of pain. Has good days and bad days. One month ago was in pain but is doing well today.  Patient has responded fairly well to manipulation.  Does seem to be more posturing.  No radiation down the arm.  Does have more of the ulnar nerve entrapment at the elbows.  Has been monitoring that at the moment.       Past Medical History:  Diagnosis Date  . Chronic kidney disease    kidney stones  . Depression   . Endometriosis   . Fibroid   . Fracture, tibial plateau   . GERD (gastroesophageal reflux disease)   . Heart murmur   . Hypothyroid   . Inflammatory arthritis 08/28/2016  . Insomnia 08/28/2016  . Migraine   . Osteoarthritis of both feet 08/28/2016  . Osteoarthritis of both hands 08/28/2016  . Osteoarthritis of both knees 08/28/2016  . 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 Sicca ([place]) 08/28/2016  . Vitamin D deficiency 08/28/2016   Past Surgical History:  Procedure Laterality Date  . LAPAROSCOPY  1994   endometriosis  . PELVIC LAPAROSCOPY  1993   d/t endometriosis--Dr. Matthew Saras   Social History   Socioeconomic History  . Marital status: Married    Spouse name: Not on file  . Number of children: 2  . Years of education: Not on file  . Highest education level: Not on file  Occupational History  . Occupation: Programmer, multimedia: [employer]  . Financial resource strain: Not on file  . Food insecurity:    Worry: Not on file    Inability: Not on file  . Transportation needs:    Medical: Not on file    Non-medical: Not on file  Tobacco Use  . Smoking status: Never Smoker  . Smokeless tobacco: Never Used  Substance and Sexual Activity  . Alcohol use: No  . Drug  use: No  . Sexual activity: Yes    Partners: Male    Birth control/protection: None  Lifestyle  . Physical activity:    Days per week: Not on file    Minutes per session: Not on file  . Stress: Not on file  Relationships  . Social connections:    Talks on phone: Not on file    Gets together: Not on file    Attends religious service: Not on file    Active member of club or organization: Not on file    Attends meetings of clubs or organizations: Not on file    Relationship status: Not on file  Other Topics Concern  . Not on file  Social History Narrative  . Not on file   Allergies  Allergen Reactions  . Compazine [Prochlorperazine Edisylate] Other (See Comments)    Muscle contractions   Family History  Problem Relation Age of Onset  . Hypertension Mother   . Dementia Mother   . Hypertension Father   . Stroke Father   . Heart disease Father   . Hypertension Brother   . Hypertension Maternal Grandmother   . Hypertension Maternal Grandfather   . Kidney failure Maternal Grandfather   . Aneurysm Maternal Grandfather   . Cancer Maternal  Aunt 42       breast  . Aneurysm Maternal Aunt   . Cancer Cousin        breast    Current Outpatient Medications (Endocrine & Metabolic):  .  levothyroxine (SYNTHROID, LEVOTHROID) 75 MCG tablet, Take 1 tablet (75 mcg total) by mouth daily.      Current Outpatient Medications (Other):  .  b complex vitamins capsule, Take 1 capsule by mouth daily. .  baclofen (LIORESAL) 10 MG tablet, TAKE 1 TABLET BY MOUTH 3 TIMES DAILY AS NEEDED FOR MUSCLE PAIN .  Calcium Carbonate-Vitamin D (CALTRATE 600+D PO), Take by mouth daily. .  cholecalciferol (VITAMIN D) 1000 units tablet, Take 4,000 Units by mouth daily.  .  Diclofenac Sodium (PENNSAID) 2 % SOLN, Place 2 application onto the skin 2 (two) times daily. Marland Kitchen  estradiol (ESTRING) 2 MG vaginal ring, Place 2 mg vaginally every 3 (three) months. Insert a new ring into vagina every 3 months .  Krill  Oil 500 MG CAPS, Take 750 mg by mouth.    Past medical history, social, surgical and family history all reviewed in electronic medical record.  No pertanent information unless stated regarding to the chief complaint.   Review of Systems:  No headache, visual changes, nausea, vomiting, diarrhea, constipation, dizziness, abdominal pain, skin rash, fevers, chills, night sweats, weight loss, swollen lymph nodes, body aches, joint swelling, muscle aches, chest pain, shortness of breath, mood changes.  Positive muscle aches  Objective  Blood pressure 112/62, pulse 74, height 5' 2.5" (1.588 m), weight 159 lb (72.1 kg), last menstrual period 02/22/2014, SpO2 98 %.    General: No apparent distress alert and oriented x3 mood and affect normal, dressed appropriately.  HEENT: Pupils equal, extraocular movements intact  Respiratory: Patient's speak in full sentences and does not appear short of breath  Cardiovascular: No lower extremity edema, non tender, no erythema  Skin: Warm dry intact with no signs of infection or rash on extremities or on axial skeleton.  Abdomen: Soft nontender  Neuro: Cranial nerves II through XII are intact, neurovascularly intact in all extremities with 2+ DTRs and 2+ pulses.  Lymph: No lymphadenopathy of posterior or anterior cervical chain or axillae bilaterally.  Gait normal with good balance and coordination.  MSK:  Non tender with full range of motion and good stability and symmetric strength and tone of shoulders, elbows, wrist, hip, knee and ankles bilaterally.  Neck: Inspection loss of lordosis. No palpable stepoffs. Negative Spurling's maneuver. Full neck range of motion Grip strength and sensation normal in bilateral hands Strength good C4 to T1 distribution No sensory change to C4 to T1 Negative Hoffman sign bilaterally Reflexes normal Tightness in the right trapezius with mild spasm    Osteopathic findings C2 flexed rotated and side bent right T3  extended rotated and side bent right inhaled third rib T7 extended rotated and side bent left L2 flexed rotated and side bent right Sacrum right on right     Impression and Recommendations:     This case required medical decision making of moderate complexity. The above documentation has been reviewed and is accurate and complete [name], DO       Note: This dictation was prepared with Dragon dictation along with smaller phrase technology. Any transcriptional errors that result from this process are unintentional.

## 2019-01-15 ENCOUNTER — Encounter: Payer: Self-pay | Admitting: Gastroenterology

## 2019-02-04 MED FILL — LEVOTHYROXINE 75 MCG TABLET: 75 | 90 days supply | Qty: 90 | Fill #2

## 2019-02-09 MED FILL — ESTRING 2 MG VAGINAL RING: 2 | 90 days supply | Qty: 1 | Fill #1

## 2019-02-15 NOTE — Progress Notes (Signed)
[name] [address], [place] [address] Phone: [phone] Subjective:      CC: neck and back pain   [id number]  Shelly [name] is a 58 y.o. female coming in with complaint of neck and back pain.  Has seen patient multiple times for them.  Has been doing relatively well.  She has been gardening on a more regular basis.  Still feels like she is doing relatively well.  Does not change anything else at this point.      Past Medical History:  Diagnosis Date  . Chronic kidney disease    kidney stones  . Depression   . Endometriosis   . Fibroid   . Fracture, tibial plateau   . GERD (gastroesophageal reflux disease)   . Heart murmur   . Hypothyroid   . Inflammatory arthritis 08/28/2016  . Insomnia 08/28/2016  . Migraine   . Osteoarthritis of both feet 08/28/2016  . Osteoarthritis of both hands 08/28/2016  . Osteoarthritis of both knees 08/28/2016  . 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 Sicca ([place]) 08/28/2016  . Vitamin D deficiency 08/28/2016   Past Surgical History:  Procedure Laterality Date  . LAPAROSCOPY  1994   endometriosis  . PELVIC LAPAROSCOPY  1993   d/t endometriosis--Dr. Matthew Saras   Social History   Socioeconomic History  . Marital status: Married    Spouse name: Not on file  . Number of children: 2  . Years of education: Not on file  . Highest education level: Not on file  Occupational History  . Occupation: Programmer, multimedia: [employer]  . Financial resource strain: Not on file  . Food insecurity:    Worry: Not on file    Inability: Not on file  . Transportation needs:    Medical: Not on file    Non-medical: Not on file  Tobacco Use  . Smoking status: Never Smoker  . Smokeless tobacco: Never Used  Substance and Sexual Activity  . Alcohol use: No  . Drug use: No  . Sexual activity: Yes    Partners: Male    Birth control/protection: None  Lifestyle  . Physical activity:    Days per week: Not  on file    Minutes per session: Not on file  . Stress: Not on file  Relationships  . Social connections:    Talks on phone: Not on file    Gets together: Not on file    Attends religious service: Not on file    Active member of club or organization: Not on file    Attends meetings of clubs or organizations: Not on file    Relationship status: Not on file  Other Topics Concern  . Not on file  Social History Narrative  . Not on file   Allergies  Allergen Reactions  . Compazine [Prochlorperazine Edisylate] Other (See Comments)    Muscle contractions   Family History  Problem Relation Age of Onset  . Hypertension Mother   . Dementia Mother   . Hypertension Father   . Stroke Father   . Heart disease Father   . Hypertension Brother   . Hypertension Maternal Grandmother   . Hypertension Maternal Grandfather   . Kidney failure Maternal Grandfather   . Aneurysm Maternal Grandfather   . Cancer Maternal Aunt 42       breast  . Aneurysm Maternal Aunt   . Cancer Cousin  breast    Current Outpatient Medications (Endocrine & Metabolic):  .  levothyroxine (SYNTHROID, LEVOTHROID) 75 MCG tablet, Take 1 tablet (75 mcg total) by mouth daily.      Current Outpatient Medications (Other):  .  b complex vitamins capsule, Take 1 capsule by mouth daily. .  baclofen (LIORESAL) 10 MG tablet, TAKE 1 TABLET BY MOUTH 3 TIMES DAILY AS NEEDED FOR MUSCLE PAIN .  Calcium Carbonate-Vitamin D (CALTRATE 600+D PO), Take by mouth daily. .  cholecalciferol (VITAMIN D) 1000 units tablet, Take 4,000 Units by mouth daily.  .  Diclofenac Sodium (PENNSAID) 2 % SOLN, Place 2 application onto the skin 2 (two) times daily. Marland Kitchen  estradiol (ESTRING) 2 MG vaginal ring, Place 2 mg vaginally every 3 (three) months. Insert a new ring into vagina every 3 months .  Krill Oil 500 MG CAPS, Take 750 mg by mouth.    Past medical history, social, surgical and family history all reviewed in electronic medical record.   No pertanent information unless stated regarding to the chief complaint.   Review of Systems:  No headache, visual changes, nausea, vomiting, diarrhea, constipation, dizziness, abdominal pain, skin rash, fevers, chills, night sweats, weight loss, swollen lymph nodes, body aches, joint swelling, muscle aches, chest pain, shortness of breath, mood changes.   Objective  Blood pressure 104/72, pulse 70, height 5' 2.5" (1.588 m), weight 158 lb (71.7 kg), last menstrual period 02/22/2014, SpO2 99 %.   General: No apparent distress alert and oriented x3 mood and affect normal, dressed appropriately.  HEENT: Pupils equal, extraocular movements intact  Respiratory: Patient's speak in full sentences and does not appear short of breath  Cardiovascular: No lower extremity edema, non tender, no erythema  Skin: Warm dry intact with no signs of infection or rash on extremities or on axial skeleton.  Abdomen: Soft nontender  Neuro: Cranial nerves II through XII are intact, neurovascularly intact in all extremities with 2+ DTRs and 2+ pulses.  Lymph: No lymphadenopathy of posterior or anterior cervical chain or axillae bilaterally.  Gait normal with good balance and coordination.  MSK:  Non tender with full range of motion and good stability and symmetric strength and tone of shoulders, elbows, wrist, hip, knee and ankles bilaterally.  Neck: Inspection loss of lordosis. No palpable stepoffs. Negative Spurling's maneuver. Mild limitation in all planes Grip strength and sensation normal in bilateral hands Strength good C4 to T1 distribution No sensory change to C4 to T1 Negative Hoffman sign bilaterally Reflexes normal  Osteopathic findings C2 flexed rotated and side bent right C4 flexed rotated and side bent left C7 flexed rotated and side bent left T3 extended rotated and side bent right inhaled third rib T9 extended rotated and side bent left L2 flexed rotated and side bent right Sacrum right on  right    Impression and Recommendations:     This case required medical decision making of moderate complexity. The above documentation has been reviewed and is accurate and complete [name], DO       Note: This dictation was prepared with Dragon dictation along with smaller phrase technology. Any transcriptional errors that result from this process are unintentional.

## 2019-02-16 ENCOUNTER — Other Ambulatory Visit: Payer: Self-pay

## 2019-02-16 ENCOUNTER — Ambulatory Visit: Payer: 59 | Admitting: Family Medicine

## 2019-02-16 ENCOUNTER — Encounter: Payer: Self-pay | Admitting: Family Medicine

## 2019-02-16 VITALS — BP 104/72 | HR 70 | Ht 62.5 in | Wt 158.0 lb

## 2019-02-16 DIAGNOSIS — M999 Biomechanical lesion, unspecified: Secondary | ICD-10-CM

## 2019-02-16 DIAGNOSIS — G2589 Other specified extrapyramidal and movement disorders: Secondary | ICD-10-CM

## 2019-02-16 NOTE — Assessment & Plan Note (Signed)
Decision today to treat with OMT was based on Physical Exam  After verbal consent patient was treated with HVLA, ME, FPR techniques in cervical, thoracic, rib lumbar and sacral areas  Patient tolerated the procedure well with improvement in symptoms  Patient given exercises, stretches and lifestyle modifications  See medications in patient instructions if given  Patient will follow up in 4-8 weeks 

## 2019-02-16 NOTE — Assessment & Plan Note (Signed)
Patient has been somewhat more active that I do think is starting to strengthen more of the scapular areas.  I am thinking that this is helping with patient overall.  Encourage patient to continue to do what she is doing at this moment.  No change in medications.  Continue to stay active.  Follow-up with me again 6 weeks

## 2019-02-16 NOTE — Patient Instructions (Signed)
Good to see you  So glad you are doing well  Stay safe  See me again in 5-6 weeks

## 2019-03-31 ENCOUNTER — Ambulatory Visit (INDEPENDENT_AMBULATORY_CARE_PROVIDER_SITE_OTHER): Payer: 59 | Admitting: Family Medicine

## 2019-03-31 ENCOUNTER — Encounter: Payer: Self-pay | Admitting: Family Medicine

## 2019-03-31 ENCOUNTER — Other Ambulatory Visit: Payer: Self-pay

## 2019-03-31 VITALS — BP 110/66 | HR 67 | Ht 62.5 in

## 2019-03-31 DIAGNOSIS — M999 Biomechanical lesion, unspecified: Secondary | ICD-10-CM | POA: Diagnosis not present

## 2019-03-31 NOTE — Progress Notes (Signed)
[name] [address], [place] [address] Phone: [phone] Subjective:   Shelly [name], am serving as a scribe for Dr. Hulan Saas.  I'm seeing this patient by the request  of:    CC: Neck and back pain follow-up  EQA:[id number]  Shelly [name] is a 58 y.o. female coming in with complaint of back pain. Has been doing ok since last visit.  Patient has had increasing stress.  Her mother-in-law recently died.  Patient's father also been in the hospital recently.  This is making her very stressed recently.  Continues to do a lot of work at home as well.  [name] radiation down the legs or arms.    Past Medical History:  Diagnosis Date  . Chronic kidney disease    kidney stones  . Depression   . Endometriosis   . Fibroid   . Fracture, tibial plateau   . GERD (gastroesophageal reflux disease)   . Heart murmur   . Hypothyroid   . Inflammatory arthritis 08/28/2016  . Insomnia 08/28/2016  . Migraine   . Osteoarthritis of both feet 08/28/2016  . Osteoarthritis of both hands 08/28/2016  . Osteoarthritis of both knees 08/28/2016  . 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 Sicca ([place]) 08/28/2016  . Vitamin D deficiency 08/28/2016   Past Surgical History:  Procedure Laterality Date  . LAPAROSCOPY  1994   endometriosis  . PELVIC LAPAROSCOPY  1993   d/t endometriosis--Dr. Matthew Saras   Social History   Socioeconomic History  . Marital status: Married    Spouse name: Not on file  . Number of children: 2  . Years of education: Not on file  . Highest education level: Not on file  Occupational History  . Occupation: Programmer, multimedia: [employer]  . Financial resource strain: Not on file  . Food insecurity:    Worry: Not on file    Inability: Not on file  . Transportation needs:    Medical: Not on file    Non-medical: Not on file  Tobacco Use  . Smoking status: Never Smoker  . Smokeless tobacco: Never Used  Substance and Sexual Activity   . Alcohol use: [name]  . Drug use: [name]  . Sexual activity: Yes    Partners: Male    Birth control/protection: None  Lifestyle  . Physical activity:    Days per week: Not on file    Minutes per session: Not on file  . Stress: Not on file  Relationships  . Social connections:    Talks on phone: Not on file    Gets together: Not on file    Attends religious service: Not on file    Active member of club or organization: Not on file    Attends meetings of clubs or organizations: Not on file    Relationship status: Not on file  Other Topics Concern  . Not on file  Social History Narrative  . Not on file   Allergies  Allergen Reactions  . Compazine [Prochlorperazine Edisylate] Other (See Comments)    Muscle contractions   Family History  Problem Relation Age of Onset  . Hypertension Mother   . Dementia Mother   . Hypertension Father   . Stroke Father   . Heart disease Father   . Hypertension Brother   . Hypertension Maternal Grandmother   . Hypertension Maternal Grandfather   . Kidney failure Maternal Grandfather   . Aneurysm Maternal Grandfather   .  Cancer Maternal Aunt 42       breast  . Aneurysm Maternal Aunt   . Cancer Cousin        breast    Current Outpatient Medications (Endocrine & Metabolic):  .  levothyroxine (SYNTHROID, LEVOTHROID) 75 MCG tablet, Take 1 tablet (75 mcg total) by mouth daily.      Current Outpatient Medications (Other):  .  b complex vitamins capsule, Take 1 capsule by mouth daily. .  baclofen (LIORESAL) 10 MG tablet, TAKE 1 TABLET BY MOUTH 3 TIMES DAILY AS NEEDED FOR MUSCLE PAIN .  Calcium Carbonate-Vitamin D (CALTRATE 600+D PO), Take by mouth daily. .  cholecalciferol (VITAMIN D) 1000 units tablet, Take 4,000 Units by mouth daily.  .  Diclofenac Sodium (PENNSAID) 2 % SOLN, Place 2 application onto the skin 2 (two) times daily. Marland Kitchen  estradiol (ESTRING) 2 MG vaginal ring, Place 2 mg vaginally every 3 (three) months. Insert a new ring into  vagina every 3 months .  Krill Oil 500 MG CAPS, Take 750 mg by mouth.    Past medical history, social, surgical and family history all reviewed in electronic medical record.  [name] pertanent information unless stated regarding to the chief complaint.   Review of Systems:  [name] headache, visual changes, nausea, vomiting, diarrhea, constipation, dizziness, abdominal pain, skin rash, fevers, chills, night sweats, weight loss, swollen lymph nodes, body aches, joint swelling, muscle aches, chest pain, shortness of breath, mood changes.  +muscle aches   Objective  Last menstrual period 02/22/2014.    General: [name] apparent distress alert and oriented x3 mood and affect normal, dressed appropriately.  HEENT: Pupils equal, extraocular movements intact  Respiratory: Patient's speak in full sentences and does not appear short of breath  Cardiovascular: [name] lower extremity edema, non tender, [name] erythema  Skin: Warm dry intact with [name] signs of infection or rash on extremities or on axial skeleton.  Abdomen: Soft nontender  Neuro: Cranial nerves II through XII are intact, neurovascularly intact in all extremities with 2+ DTRs and 2+ pulses.  Lymph: [name] lymphadenopathy of posterior or anterior cervical chain or axillae bilaterally.  Gait normal with good balance and coordination.  MSK:  Non tender with full range of motion and good stability and symmetric strength and tone of shoulders, elbows, wrist, hip, knee and ankles bilaterally.  Neck: Inspection unremarkable. [name] palpable stepoffs. Negative Spurling's maneuver. Full neck range of motion Grip strength and sensation normal in bilateral hands Strength good C4 to T1 distribution [name] sensory change to C4 to T1 Negative Hoffman sign bilaterally Reflexes normal  Back Exam:  Inspection: loss of lordosis  Motion: Flexion 45 deg, Extension 45 deg, Side Bending to 45 deg bilaterally,  Rotation to 45 deg bilaterally  SLR laying: Negative  XSLR laying:  Negative  Palpable tenderness: Tender to palpation the paraspinal musculature diffusely. FABER: Positive bilaterally . Sensory change: Gross sensation intact to all lumbar and sacral dermatomes.  Reflexes: 2+ at both patellar tendons, 2+ at achilles tendons, Babinski's downgoing.  Strength at foot  Plantar-flexion: 5/5 Dorsi-flexion: 5/5 Eversion: 5/5 Inversion: 5/5  Leg strength  Quad: 5/5 Hamstring: 5/5 Hip flexor: 5/5 Hip abductors: 5/5  Gait unremarkable.  Osteopathic findings  C2 flexed rotated and side bent right C4 flexed rotated and side bent left C6 flexed rotated and side bent left T3 extended rotated and side bent right inhaled third rib T9 extended rotated and side bent left L2 flexed rotated and side bent right Sacrum right on right  Impression and Recommendations:     This case required medical decision making of moderate complexity. The above documentation has been reviewed and is accurate and complete [name], DO       Note: This dictation was prepared with Dragon dictation along with smaller phrase technology. Any transcriptional errors that result from this process are unintentional.

## 2019-03-31 NOTE — Patient Instructions (Signed)
See me again in 6 weeks 

## 2019-03-31 NOTE — Assessment & Plan Note (Signed)
Decision today to treat with OMT was based on Physical Exam  After verbal consent patient was treated with HVLA, ME, FPR techniques in cervical, thoracic, rib lumbar and sacral areas  Patient tolerated the procedure well with improvement in symptoms  Patient given exercises, stretches and lifestyle modifications  See medications in patient instructions if given  Patient will follow up in 6 weeks 

## 2019-05-13 ENCOUNTER — Other Ambulatory Visit: Payer: Self-pay

## 2019-05-13 ENCOUNTER — Encounter: Payer: Self-pay | Admitting: Family Medicine

## 2019-05-13 ENCOUNTER — Ambulatory Visit (INDEPENDENT_AMBULATORY_CARE_PROVIDER_SITE_OTHER): Payer: 59 | Admitting: Family Medicine

## 2019-05-13 VITALS — BP 118/62 | HR 71 | Ht 62.5 in | Wt 155.4 lb

## 2019-05-13 DIAGNOSIS — M999 Biomechanical lesion, unspecified: Secondary | ICD-10-CM

## 2019-05-13 DIAGNOSIS — G2589 Other specified extrapyramidal and movement disorders: Secondary | ICD-10-CM

## 2019-05-13 NOTE — Assessment & Plan Note (Addendum)
Decision today to treat with OMT was based on Physical Exam  After verbal consent patient was treated with HVLA, ME, FPR techniques in cervical, thoracic, rib lumbar and sacral areas  Patient tolerated the procedure well with improvement in symptoms  Patient given exercises, stretches and lifestyle modifications  See medications in patient instructions if given  Patient will follow up in 4-8 weeks 

## 2019-05-13 NOTE — Assessment & Plan Note (Signed)
Continue scapular dyskinesis.  Patient encouraged to continue with the exercises.  Discussed which activities of doing which wants to avoid.  Increase activity slowly.  Follow-up again in 4 to 8 weeks.

## 2019-05-13 NOTE — Progress Notes (Signed)
[name] [address] [place], [place] [address] Phone: [phone] Subjective:   I Shelly [name] am serving as a Education administrator for Dr. Hulan Saas.  I'm seeing this patient by the request  of:    CC: Pain follow-up  LKT:[id number]  Shelly [name] is a 58 y.o. female coming in with complaint of back pain. States that she is doing ok today.  Patient states that tenderness was shoulder area bilaterally.  Patient denies any radiation to the extremities.  Patient does have mild to discomfort overall.  Patient has some numbness and tingling intermittently but feels that this is not associated.     Past Medical History:  Diagnosis Date  . Chronic kidney disease    kidney stones  . Depression   . Endometriosis   . Fibroid   . Fracture, tibial plateau   . GERD (gastroesophageal reflux disease)   . Heart murmur   . Hypothyroid   . Inflammatory arthritis 08/28/2016  . Insomnia 08/28/2016  . Migraine   . Osteoarthritis of both feet 08/28/2016  . Osteoarthritis of both hands 08/28/2016  . Osteoarthritis of both knees 08/28/2016  . 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 Sicca ([place]) 08/28/2016  . Vitamin D deficiency 08/28/2016   Past Surgical History:  Procedure Laterality Date  . LAPAROSCOPY  1994   endometriosis  . PELVIC LAPAROSCOPY  1993   d/t endometriosis--Dr. Matthew Saras   Social History   Socioeconomic History  . Marital status: Married    Spouse name: Not on file  . Number of children: 2  . Years of education: Not on file  . Highest education level: Not on file  Occupational History  . Occupation: Programmer, multimedia: [employer]  . Financial resource strain: Not on file  . Food insecurity    Worry: Not on file    Inability: Not on file  . Transportation needs    Medical: Not on file    Non-medical: Not on file  Tobacco Use  . Smoking status: Never Smoker  . Smokeless tobacco: Never Used  Substance and Sexual Activity  . Alcohol  use: No  . Drug use: No  . Sexual activity: Yes    Partners: Male    Birth control/protection: None  Lifestyle  . Physical activity    Days per week: Not on file    Minutes per session: Not on file  . Stress: Not on file  Relationships  . Social Herbalist on phone: Not on file    Gets together: Not on file    Attends religious service: Not on file    Active member of club or organization: Not on file    Attends meetings of clubs or organizations: Not on file    Relationship status: Not on file  Other Topics Concern  . Not on file  Social History Narrative  . Not on file   Allergies  Allergen Reactions  . Compazine [Prochlorperazine Edisylate] Other (See Comments)    Muscle contractions   Family History  Problem Relation Age of Onset  . Hypertension Mother   . Dementia Mother   . Hypertension Father   . Stroke Father   . Heart disease Father   . Hypertension Brother   . Hypertension Maternal Grandmother   . Hypertension Maternal Grandfather   . Kidney failure Maternal Grandfather   . Aneurysm Maternal Grandfather   . Cancer Maternal Aunt 56  breast  . Aneurysm Maternal Aunt   . Cancer Cousin        breast    Current Outpatient Medications (Endocrine & Metabolic):  .  levothyroxine (SYNTHROID, LEVOTHROID) 75 MCG tablet, Take 1 tablet (75 mcg total) by mouth daily.      Current Outpatient Medications (Other):  .  b complex vitamins capsule, Take 1 capsule by mouth daily. .  baclofen (LIORESAL) 10 MG tablet, TAKE 1 TABLET BY MOUTH 3 TIMES DAILY AS NEEDED FOR MUSCLE PAIN .  cholecalciferol (VITAMIN D) 1000 units tablet, Take 4,000 Units by mouth daily.  .  Diclofenac Sodium (PENNSAID) 2 % SOLN, Place 2 application onto the skin 2 (two) times daily. Marland Kitchen  estradiol (ESTRING) 2 MG vaginal ring, Place 2 mg vaginally every 3 (three) months. Insert a new ring into vagina every 3 months .  Krill Oil 500 MG CAPS, Take 750 mg by mouth.    Past medical  history, social, surgical and family history all reviewed in electronic medical record.  No pertanent information unless stated regarding to the chief complaint.   Review of Systems:  No headache, visual changes, nausea, vomiting, diarrhea, constipation, dizziness, abdominal pain, skin rash, fevers, chills, night sweats, weight loss, swollen lymph nodes, body aches, joint swelling,  chest pain, shortness of breath, mood changes.  Positive muscle aches  Objective  Blood pressure 118/62, pulse 71, height 5' 2.5" (1.588 m), weight 155 lb 6.4 oz (70.5 kg), last menstrual period 02/22/2014, SpO2 98 %.     General: No apparent distress alert and oriented x3 mood and affect normal, dressed appropriately.  HEENT: Pupils equal, extraocular movements intact  Respiratory: Patient's speak in full sentences and does not appear short of breath  Cardiovascular: No lower extremity edema, non tender, no erythema  Skin: Warm dry intact with no signs of infection or rash on extremities or on axial skeleton.  Abdomen: Soft nontender  Neuro: Cranial nerves II through XII are intact, neurovascularly intact in all extremities with 2+ DTRs and 2+ pulses.  Lymph: No lymphadenopathy of posterior or anterior cervical chain or axillae bilaterally.  Gait normal with good balance and coordination.  MSK:  Non tender with full range of motion and good stability and symmetric strength and tone of shoulders, elbows, wrist, hip, knee and ankles bilaterally.  Patient back exam shows the patient does have some mild kyphosis of the upper thoracic spine.  Tender to palpation in the parascapular region bilaterally.  Full range of motion of the shoulders bilaterally.  Tightness in the neck.  Negative Spurling's.  Good grip strength.  Osteopathic findings C2 flexed rotated and side bent right C6 flexed rotated and side bent left T3 extended rotated and side bent right inhaled third rib T9 extended rotated and side bent left L2  flexed rotated and side bent right Sacrum right on right    Impression and Recommendations:     This case required medical decision making of moderate complexity. The above documentation has been reviewed and is accurate and complete [name], DO       Note: This dictation was prepared with Dragon dictation along with smaller phrase technology. Any transcriptional errors that result from this process are unintentional.

## 2019-05-14 ENCOUNTER — Other Ambulatory Visit: Payer: Self-pay

## 2019-05-14 ENCOUNTER — Encounter: Payer: Self-pay | Admitting: Family Medicine

## 2019-05-14 ENCOUNTER — Ambulatory Visit (INDEPENDENT_AMBULATORY_CARE_PROVIDER_SITE_OTHER): Payer: 59 | Admitting: Family Medicine

## 2019-05-14 VITALS — BP 140/84 | HR 73 | Temp 97.9°F | Ht 66.0 in | Wt 155.6 lb

## 2019-05-14 DIAGNOSIS — E559 Vitamin D deficiency, unspecified: Secondary | ICD-10-CM

## 2019-05-14 DIAGNOSIS — E039 Hypothyroidism, unspecified: Secondary | ICD-10-CM

## 2019-05-14 DIAGNOSIS — Z1211 Encounter for screening for malignant neoplasm of colon: Secondary | ICD-10-CM | POA: Diagnosis not present

## 2019-05-14 DIAGNOSIS — Z0000 Encounter for general adult medical examination without abnormal findings: Secondary | ICD-10-CM | POA: Diagnosis not present

## 2019-05-14 MED ORDER — LEVOTHYROXINE SODIUM 75 MCG PO TABS: 75.0000 ug | ORAL_TABLET | Freq: Every day | ORAL | 3 refills | Status: DC

## 2019-05-14 MED FILL — LEVOTHYROXINE 75 MCG TABLET: 75 | 90 days supply | Qty: 90 | Fill #0

## 2019-05-14 NOTE — Patient Instructions (Signed)
Health Maintenance, Female Adopting a healthy lifestyle and getting preventive care are important in promoting health and wellness. Ask your health care provider about:  The right schedule for you to have regular tests and exams.  Things you can do on your own to prevent diseases and keep yourself healthy. What should I know about diet, weight, and exercise? Eat a healthy diet   Eat a diet that includes plenty of vegetables, fruits, low-fat dairy products, and lean protein.  Do not eat a lot of foods that are high in solid fats, added sugars, or sodium. Maintain a healthy weight Body mass index (BMI) is used to identify weight problems. It estimates body fat based on height and weight. Your health care provider can help determine your BMI and help you achieve or maintain a healthy weight. Get regular exercise Get regular exercise. This is one of the most important things you can do for your health. Most adults should:  Exercise for at least 150 minutes each week. The exercise should increase your heart rate and make you sweat (moderate-intensity exercise).  Do strengthening exercises at least twice a week. This is in addition to the moderate-intensity exercise.  Spend less time sitting. Even light physical activity can be beneficial. Watch cholesterol and blood lipids Have your blood tested for lipids and cholesterol at 56 years of age, then have this test every 5 years. Have your cholesterol levels checked more often if:  Your lipid or cholesterol levels are high.  You are older than 56 years of age.  You are at high risk for heart disease. What should I know about cancer screening? Depending on your health history and family history, you may need to have cancer screening at various ages. This may include screening for:  Breast cancer.  Cervical cancer.  Colorectal cancer.  Skin cancer.  Lung cancer. What should I know about heart disease, diabetes, and high blood  pressure? Blood pressure and heart disease  High blood pressure causes heart disease and increases the risk of stroke. This is more likely to develop in people who have high blood pressure readings, are of African descent, or are overweight.  Have your blood pressure checked: ? Every 3-5 years if you are 18-39 years of age. ? Every year if you are 40 years old or older. Diabetes Have regular diabetes screenings. This checks your fasting blood sugar level. Have the screening done:  Once every three years after age 40 if you are at a normal weight and have a low risk for diabetes.  More often and at a younger age if you are overweight or have a high risk for diabetes. What should I know about preventing infection? Hepatitis B If you have a higher risk for hepatitis B, you should be screened for this virus. Talk with your health care provider to find out if you are at risk for hepatitis B infection. Hepatitis C Testing is recommended for:  Everyone born from 1945 through 1965.  Anyone with known risk factors for hepatitis C. Sexually transmitted infections (STIs)  Get screened for STIs, including gonorrhea and chlamydia, if: ? You are sexually active and are younger than 56 years of age. ? You are older than 56 years of age and your health care provider tells you that you are at risk for this type of infection. ? Your sexual activity has changed since you were last screened, and you are at increased risk for chlamydia or gonorrhea. Ask your health care provider if   you are at risk.  Ask your health care provider about whether you are at high risk for HIV. Your health care provider may recommend a prescription medicine to help prevent HIV infection. If you choose to take medicine to prevent HIV, you should first get tested for HIV. You should then be tested every 3 months for as long as you are taking the medicine. Pregnancy  If you are about to stop having your period (premenopausal) and  you may become pregnant, seek counseling before you get pregnant.  Take 400 to 800 micrograms (mcg) of folic acid every day if you become pregnant.  Ask for birth control (contraception) if you want to prevent pregnancy. Osteoporosis and menopause Osteoporosis is a disease in which the bones lose minerals and strength with aging. This can result in bone fractures. If you are 65 years old or older, or if you are at risk for osteoporosis and fractures, ask your health care provider if you should:  Be screened for bone loss.  Take a calcium or vitamin D supplement to lower your risk of fractures.  Be given hormone replacement therapy (HRT) to treat symptoms of menopause. Follow these instructions at home: Lifestyle  Do not use any products that contain nicotine or tobacco, such as cigarettes, e-cigarettes, and chewing tobacco. If you need help quitting, ask your health care provider.  Do not use street drugs.  Do not share needles.  Ask your health care provider for help if you need support or information about quitting drugs. Alcohol use  Do not drink alcohol if: ? Your health care provider tells you not to drink. ? You are pregnant, may be pregnant, or are planning to become pregnant.  If you drink alcohol: ? Limit how much you use to 0-1 drink a day. ? Limit intake if you are breastfeeding.  Be aware of how much alcohol is in your drink. In the [place] one drink equals one 12 oz bottle of beer (355 mL), one 5 oz glass of wine (148 mL), or one 1 oz glass of hard liquor (44 mL). General instructions  Schedule regular health, dental, and eye exams.  Stay current with your vaccines.  Tell your health care provider if: ? You often feel depressed. ? You have ever been abused or do not feel safe at home. Summary  Adopting a healthy lifestyle and getting preventive care are important in promoting health and wellness.  Follow your health care provider's instructions about healthy  diet, exercising, and getting tested or screened for diseases.  Follow your health care provider's instructions on monitoring your cholesterol and blood pressure. This information is not intended to replace advice given to you by your health care provider. Make sure you discuss any questions you have with your health care provider. Document Released: 04/29/2011 Document Revised: 10/07/2018 Document Reviewed: 10/07/2018 Elsevier Patient Education  2020 Elsevier Inc.  

## 2019-05-14 NOTE — Progress Notes (Signed)
Shelly [name] is a 58 y.o. female  Chief Complaint  Patient presents with  . Establish Care    est care/CPE/ not fasting/ Colonoscopy -- march 2015     HPI: Shelly [name] is a 58 y.o. female here to establish care with our office. She has 2 children and 1 granddaughter who is 3yo. 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 She also cares for her parents.  She is due for f/u colonoscopy.   Specialists: sports med - Dr. Tamala Julian; rheum - Dr. Estanislado Pandy; GYN - Dr. Quincy Simmonds   Last PAP: 03/2018 Last mammo: 12/2018 Last colonoscopy:  ([facility] Dr. Fuller Plan) - due in 2020  Past Medical History:  Diagnosis Date  . Chronic kidney disease    kidney stones  . Depression   . Endometriosis   . Fibroid   . Fracture, tibial plateau   . GERD (gastroesophageal reflux disease)   . Heart murmur   . Hypothyroid   . Inflammatory arthritis 08/28/2016  . Insomnia 08/28/2016  . Migraine   . Osteoarthritis of both feet 08/28/2016  . Osteoarthritis of both hands 08/28/2016  . Osteoarthritis of both knees 08/28/2016  . 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 Sicca ([place]) 08/28/2016  . Vitamin D deficiency 08/28/2016    Past Surgical History:  Procedure Laterality Date  . LAPAROSCOPY  1994   endometriosis  . PELVIC LAPAROSCOPY  1993   d/t endometriosis--Dr. Matthew Saras    Social History   Socioeconomic History  . Marital status: Married    Spouse name: Not on file  . Number of children: 2  . Years of education: Not on file  . Highest education level: Not on file  Occupational History  . Occupation: Programmer, multimedia: [employer]  . Financial resource strain: Not on file  . Food insecurity    Worry: Not on file    Inability: Not on file  . Transportation needs    Medical: Not on file    Non-medical: Not on file  Tobacco Use  . Smoking status: Never Smoker  . Smokeless tobacco: Never Used  Substance and Sexual Activity  . Alcohol use: No  . Drug use: No  . Sexual activity: Yes   Partners: Male    Birth control/protection: None  Lifestyle  . Physical activity    Days per week: Not on file    Minutes per session: Not on file  . Stress: Not on file  Relationships  . Social Herbalist on phone: Not on file    Gets together: Not on file    Attends religious service: Not on file    Active member of club or organization: Not on file    Attends meetings of clubs or organizations: Not on file    Relationship status: Not on file  . Intimate partner violence    Fear of current or ex partner: Not on file    Emotionally abused: Not on file    Physically abused: Not on file    Forced sexual activity: Not on file  Other Topics Concern  . Not on file  Social History Narrative  . Not on file    Family History  Problem Relation Age of Onset  . Hypertension Mother   . Dementia Mother   . Hypertension Father   . Stroke Father   . Heart disease Father   . Hypertension Brother   . Hypertension Maternal Grandmother   .  Hypertension Maternal Grandfather   . Kidney failure Maternal Grandfather   . Aneurysm Maternal Grandfather   . Cancer Maternal Aunt 42       breast  . Aneurysm Maternal Aunt   . Cancer Cousin        breast     Immunization History  Administered Date(s) Administered  . Influenza-Unspecified 07/28/2014, 08/14/2018    Outpatient Encounter Medications as of 05/14/2019  Medication Sig  . b complex vitamins capsule Take 1 capsule by mouth daily.  . baclofen (LIORESAL) 10 MG tablet TAKE 1 TABLET BY MOUTH 3 TIMES DAILY AS NEEDED FOR MUSCLE PAIN  . cholecalciferol (VITAMIN D) 1000 units tablet Take 2,000 Units by mouth daily.   Marland Kitchen estradiol (ESTRING) 2 MG vaginal ring Place 2 mg vaginally every 3 (three) months. Insert a new ring into vagina every 3 months  . Krill Oil 500 MG CAPS Take 750 mg by mouth.  . levothyroxine (SYNTHROID) 75 MCG tablet Take 1 tablet (75 mcg total) by mouth daily.  . [DISCONTINUED] levothyroxine (SYNTHROID,  LEVOTHROID) 75 MCG tablet Take 1 tablet (75 mcg total) by mouth daily.  . [DISCONTINUED] Diclofenac Sodium (PENNSAID) 2 % SOLN Place 2 application onto the skin 2 (two) times daily. (Patient not taking: Reported on 05/14/2019)   No facility-administered encounter medications on file as of 05/14/2019.      ROS: Gen: no fever, chills  Skin: no rash, itching ENT: no ear pain, ear drainage, nasal congestion, rhinorrhea, sinus pressure, sore throat Eyes: no blurry vision, double vision Resp: no cough, wheeze,SOB Breast: no breast tenderness, no nipple discharge, no breast masses CV: no CP, palpitations, LE edema,  GI: no heartburn, n/v/d/c, abd pain GU: no dysuria, urgency, frequency, hematuria; no vaginal itching, odor, discharge MSK: + joint pains, arthritis in multiple joints  Neuro: no dizziness, headache, weakness, vertigo Psych: no depression, some mild anxiety,no insomnia   Allergies  Allergen Reactions  . Compazine [Prochlorperazine Edisylate] Other (See Comments)    Muscle contractions    BP 140/84   Pulse 73   Temp 97.9 F (36.6 C) (Oral)   Ht 5\' 6"  (1.676 m)   Wt 155 lb 9.6 oz (70.6 kg)   LMP 02/22/2014   SpO2 99%   BMI 25.11 kg/m   BP Readings from Last 3 Encounters:  05/14/19 140/84  05/13/19 118/62  03/31/19 110/66   Physical Exam   A/P:  1. Annual physical exam - UTD on PAP, mammo; due for colonoscopy (referral placed today) - pt plans to resume gluten free diet as she feels this helped with her joint pains; swims and walks for exercise - continue this - immunizations UTD - ALT; Future - AST; Future - Basic metabolic panel; Future - VITAMIN D 25 Hydroxy (Vit-D Deficiency, Fractures); Future - Lipid panel; Future - CBC; Future - next CPE in 1 year  2. Screening for colon cancer - Ambulatory referral to Gastroenterology  3. Vitamin D deficiency - VITAMIN D 25 Hydroxy (Vit-D Deficiency, Fractures); Future  4. Hypothyroidism, unspecified type  Refill: - levothyroxine (SYNTHROID) 75 MCG tablet; Take 1 tablet (75 mcg total) by mouth daily.  Dispense: 90 tablet; Refill: 3- T4, free; Future - TSH; Future - T3; Future - Thyroid peroxidase antibody; Future - Thyroid stimulating immunoglobulin; Future

## 2019-05-17 ENCOUNTER — Other Ambulatory Visit: Payer: Self-pay | Admitting: Obstetrics and Gynecology

## 2019-05-17 NOTE — Telephone Encounter (Signed)
Medication refill request: Estring Last OV:  09/09/18 Next AEX: nothing scheduled  Last MMG (if hormonal medication request): 12/31/18 Bi-rads 1 neg  Refill authorized: #1 with 1 RF

## 2019-05-18 NOTE — Telephone Encounter (Signed)
Please contact patient to schedule an annual exam with me.  She has been seen only for problem visits for atrophy and has not completed a recent exam.   I can refill the Estring once.

## 2019-05-18 NOTE — Telephone Encounter (Signed)
Spoke with patient, advised as seen below per Dr. Quincy Simmonds. Patient declines to schedule AEX at this time, will return call to schedule. Patient verbalizes understanding.   Routing to provider for final review. Patient is agreeable to disposition. Will close encounter.

## 2019-05-31 ENCOUNTER — Other Ambulatory Visit (INDEPENDENT_AMBULATORY_CARE_PROVIDER_SITE_OTHER): Payer: 59

## 2019-05-31 DIAGNOSIS — E559 Vitamin D deficiency, unspecified: Secondary | ICD-10-CM

## 2019-05-31 DIAGNOSIS — Z0000 Encounter for general adult medical examination without abnormal findings: Secondary | ICD-10-CM

## 2019-05-31 DIAGNOSIS — E039 Hypothyroidism, unspecified: Secondary | ICD-10-CM | POA: Diagnosis not present

## 2019-05-31 LAB — LIPID PANEL
Cholesterol: 178 mg/dL (ref 0–200)
HDL: 62.7 mg/dL (ref >39.00)
LDL Cholesterol: 107 mg/dL — ABNORMAL HIGH (ref 0–99)
NonHDL: 115.01
Total CHOL/HDL Ratio: 3
Triglycerides: 42 mg/dL (ref 0.0–149.0)
VLDL: 8.4 mg/dL (ref 0.0–40.0)

## 2019-05-31 LAB — CBC
HCT: 40.4 % (ref 36.0–46.0)
Hemoglobin: 13.4 g/dL (ref 12.0–15.0)
MCHC: 33.1 g/dL (ref 30.0–36.0)
MCV: 97.3 fl (ref 78.0–100.0)
Platelets: 222 10*3/uL (ref 150.0–400.0)
RBC: 4.15 Mil/uL (ref 3.87–5.11)
RDW: 12.9 % (ref 11.5–15.5)
WBC: 3.2 10*3/uL — ABNORMAL LOW (ref 4.0–10.5)

## 2019-05-31 LAB — VITAMIN D 25 HYDROXY (VIT D DEFICIENCY, FRACTURES): VITD: 43.32 ng/mL (ref 30.00–100.00)

## 2019-05-31 LAB — BASIC METABOLIC PANEL
BUN: 20 mg/dL (ref 6–23)
CO2: 26 mEq/L (ref 19–32)
Calcium: 9.4 mg/dL (ref 8.4–10.5)
Chloride: 110 mEq/L (ref 96–112)
Creatinine, Ser: 0.54 mg/dL (ref 0.40–1.20)
GFR: 116.58 mL/min (ref >60.00)
Glucose, Bld: 100 mg/dL — ABNORMAL HIGH (ref 70–99)
Potassium: 5 mEq/L (ref 3.5–5.1)
Sodium: 145 mEq/L (ref 135–145)

## 2019-05-31 LAB — T4, FREE: Free T4: 1.4 ng/dL (ref 0.60–1.60)

## 2019-05-31 LAB — AST: AST: 14 U/L (ref 0–37)

## 2019-05-31 LAB — TSH: TSH: 1.78 u[IU]/mL (ref 0.35–4.50)

## 2019-05-31 LAB — ALT: ALT: 16 U/L (ref 0–35)

## 2019-05-31 MED FILL — ESTRING 2 MG VAGINAL RING: 2 | 90 days supply | Qty: 1 | Fill #0

## 2019-06-02 ENCOUNTER — Encounter: Payer: Self-pay | Admitting: Family Medicine

## 2019-06-03 LAB — THYROID STIMULATING IMMUNOGLOBULIN: TSI: <89 % baseline (ref <140)

## 2019-06-03 LAB — THYROID PEROXIDASE ANTIBODY: Thyroperoxidase Ab SerPl-aCnc: 2 IU/mL (ref <9)

## 2019-06-03 LAB — T3: T3, Total: 97 ng/dL (ref 76–181)

## 2019-06-09 ENCOUNTER — Telehealth: Payer: Self-pay | Admitting: *Deleted

## 2019-06-09 NOTE — Telephone Encounter (Signed)
PA request received from [facility] for Estring vaginal ring 2mg . PA submitted via [email or web address].   Key: [id number] Prior Authorization is not required for this medication dosage form and strength at the quantity and days supply requested.  Call placed to pharmacy, spoke with Aaron Edelman. Was advised PA sent in error, PA not required, medication has been filled.   Encounter closed.

## 2019-06-30 ENCOUNTER — Other Ambulatory Visit: Payer: Self-pay

## 2019-06-30 ENCOUNTER — Encounter: Payer: Self-pay | Admitting: Family Medicine

## 2019-06-30 ENCOUNTER — Ambulatory Visit: Payer: 59 | Admitting: Family Medicine

## 2019-06-30 VITALS — BP 120/82 | HR 76

## 2019-06-30 DIAGNOSIS — M999 Biomechanical lesion, unspecified: Secondary | ICD-10-CM

## 2019-06-30 DIAGNOSIS — M542 Cervicalgia: Secondary | ICD-10-CM | POA: Diagnosis not present

## 2019-06-30 DIAGNOSIS — G8929 Other chronic pain: Secondary | ICD-10-CM

## 2019-06-30 NOTE — Assessment & Plan Note (Signed)
Decision today to treat with OMT was based on Physical Exam  After verbal consent patient was treated with HVLA, ME, FPR techniques in cervical, thoracic, rib  lumbar and sacral areas  Patient tolerated the procedure well with improvement in symptoms  Patient given exercises, stretches and lifestyle modifications  See medications in patient instructions if given  Patient will follow up in 4-12 weeks

## 2019-06-30 NOTE — Assessment & Plan Note (Signed)
Chronic neck pain that is more multifactorial.  Patient also has chronic back pain.  Has responded fairly well to osteopathic manipulation.  We have discussed medications in significant detail.  Continues to have the baclofen for breakthrough pain.  Does not want to do chronic anti-inflammatories.  Patient is to increase activity slowly.  Follow-up again in 4 to 8 weeks

## 2019-06-30 NOTE — Progress Notes (Signed)
[name] [address] [place], [place] [address] Phone: [phone] Subjective:   I Shelly [name] am serving as a Education administrator for Dr. Hulan Saas.  CC: Neck and back pain follow-up  [id number]  Shelly [name] is a 58 y.o. female coming in with complaint of neck and back pain. States that she is feeling pretty good.  Mild tightness here and there but nothing severe     Past Medical History:  Diagnosis Date  . Chronic kidney disease    kidney stones  . Depression   . Endometriosis   . Fibroid   . Fracture, tibial plateau   . GERD (gastroesophageal reflux disease)   . Heart murmur   . Hypothyroid   . Inflammatory arthritis 08/28/2016  . Insomnia 08/28/2016  . Migraine   . Osteoarthritis of both feet 08/28/2016  . Osteoarthritis of both hands 08/28/2016  . Osteoarthritis of both knees 08/28/2016  . 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 Sicca ([place]) 08/28/2016  . Vitamin D deficiency 08/28/2016   Past Surgical History:  Procedure Laterality Date  . LAPAROSCOPY  1994   endometriosis  . PELVIC LAPAROSCOPY  1993   d/t endometriosis--Dr. Matthew Saras   Social History   Socioeconomic History  . Marital status: Married    Spouse name: Not on file  . Number of children: 2  . Years of education: Not on file  . Highest education level: Not on file  Occupational History  . Occupation: Programmer, multimedia: [employer]  . Financial resource strain: Not on file  . Food insecurity    Worry: Not on file    Inability: Not on file  . Transportation needs    Medical: Not on file    Non-medical: Not on file  Tobacco Use  . Smoking status: Never Smoker  . Smokeless tobacco: Never Used  Substance and Sexual Activity  . Alcohol use: No  . Drug use: No  . Sexual activity: Yes    Partners: Male    Birth control/protection: None  Lifestyle  . Physical activity    Days per week: Not on file    Minutes per session: Not on file  . Stress: Not on  file  Relationships  . Social Herbalist on phone: Not on file    Gets together: Not on file    Attends religious service: Not on file    Active member of club or organization: Not on file    Attends meetings of clubs or organizations: Not on file    Relationship status: Not on file  Other Topics Concern  . Not on file  Social History Narrative  . Not on file   Allergies  Allergen Reactions  . Compazine [Prochlorperazine Edisylate] Other (See Comments)    Muscle contractions   Family History  Problem Relation Age of Onset  . Hypertension Mother   . Dementia Mother   . Hypertension Father   . Stroke Father   . Heart disease Father   . Hypertension Brother   . Hypertension Maternal Grandmother   . Hypertension Maternal Grandfather   . Kidney failure Maternal Grandfather   . Aneurysm Maternal Grandfather   . Cancer Maternal Aunt 42       breast  . Aneurysm Maternal Aunt   . Cancer Cousin        breast    Current Outpatient Medications (Endocrine & Metabolic):  .  levothyroxine (SYNTHROID) 75 MCG  tablet, Take 1 tablet (75 mcg total) by mouth daily.      Current Outpatient Medications (Other):  .  b complex vitamins capsule, Take 1 capsule by mouth daily. .  baclofen (LIORESAL) 10 MG tablet, TAKE 1 TABLET BY MOUTH 3 TIMES DAILY AS NEEDED FOR MUSCLE PAIN .  cholecalciferol (VITAMIN D) 1000 units tablet, Take 2,000 Units by mouth daily.  Marland Kitchen  ESTRING 2 MG vaginal ring, INSERT 1 RING VAGINALLY EVERY 3 MONTHS .  Krill Oil 500 MG CAPS, Take 750 mg by mouth.    Past medical history, social, surgical and family history all reviewed in electronic medical record.  No pertanent information unless stated regarding to the chief complaint.   Review of Systems:  No headache, visual changes, nausea, vomiting, diarrhea, constipation, dizziness, abdominal pain, skin rash, fevers, chills, night sweats, weight loss, swollen lymph nodes, body aches, joint swelling, chest pain,  shortness of breath, mood changes.  Positive muscle aches  Objective  Blood pressure 120/82, pulse 76, last menstrual period 02/22/2014, SpO2 97 %.    General: No apparent distress alert and oriented x3 mood and affect normal, dressed appropriately.  HEENT: Pupils equal, extraocular movements intact  Respiratory: Patient's speak in full sentences and does not appear short of breath  Cardiovascular: No lower extremity edema, non tender, no erythema  Skin: Warm dry intact with no signs of infection or rash on extremities or on axial skeleton.  Abdomen: Soft nontender  Neuro: Cranial nerves II through XII are intact, neurovascularly intact in all extremities with 2+ DTRs and 2+ pulses.  Lymph: No lymphadenopathy of posterior or anterior cervical chain or axillae bilaterally.  Gait normal with good balance and coordination.  MSK:  Non tender with full range of motion and good stability and symmetric strength and tone of shoulders, elbows, wrist, hip, knee and ankles bilaterally.  Neck: Inspection mild loss of lordosis. No palpable stepoffs. Negative Spurling's maneuver. Full neck range of motion Grip strength and sensation normal in bilateral hands Strength good C4 to T1 distribution No sensory change to C4 to T1 Negative Hoffman sign bilaterally Reflexes normal Tightness of the trapezius bilaterally  Back Exam:  Inspection: Unremarkable  Motion: Flexion 45 deg, Extension 45 deg, Side Bending to 45 deg bilaterally,  Rotation to 45 deg bilaterally  SLR laying: Negative  XSLR laying: Negative  Palpable tenderness: None. FABER: negative. Sensory change: Gross sensation intact to all lumbar and sacral dermatomes.  Reflexes: 2+ at both patellar tendons, 2+ at achilles tendons, Babinski's downgoing.  Strength at foot  Plantar-flexion: 5/5 Dorsi-flexion: 5/5 Eversion: 5/5 Inversion: 5/5  Leg strength  Quad: 5/5 Hamstring: 5/5 Hip flexor: 5/5 Hip abductors: 5/5  Gait unremarkable.   Osteopathic findings C4 flexed rotated and side bent left C6 flexed rotated and side bent left T3 extended rotated and side bent right inhaled third rib T9 extended rotated and side bent left L2 flexed rotated and side bent right Sacrum right on right    Impression and Recommendations:     This case required medical decision making of moderate complexity. The above documentation has been reviewed and is accurate and complete [name], DO       Note: This dictation was prepared with Dragon dictation along with smaller phrase technology. Any transcriptional errors that result from this process are unintentional.

## 2019-07-12 ENCOUNTER — Encounter: Payer: Self-pay | Admitting: Family Medicine

## 2019-08-09 NOTE — Progress Notes (Signed)
[name] [address] [place], [place] [address] Phone: [phone] Subjective:   Shelly [name], am serving as a scribe for Dr. Hulan Saas.   CC: Neck pain follow-up  [id number]  Shelly [name] is a 58 y.o. female coming in with complaint of neck pain. Last seen on 06/30/2019 for OMT to manage her neck pain. Patient states she is the same as last visit. [name] change to her neck pain.  Patient has noticed more stress recently, patient's father has been admitted to the hospital with atrial fibrillation and potentially an upper respiratory infection, patient is concerned with him being admitted in the hospital.  Feels like she has not been sleeping appropriately.     Past Medical History:  Diagnosis Date  . Chronic kidney disease    kidney stones  . Depression   . Endometriosis   . Fibroid   . Fracture, tibial plateau   . GERD (gastroesophageal reflux disease)   . Heart murmur   . Hypothyroid   . Inflammatory arthritis 08/28/2016  . Insomnia 08/28/2016  . Migraine   . Osteoarthritis of both feet 08/28/2016  . Osteoarthritis of both hands 08/28/2016  . Osteoarthritis of both knees 08/28/2016  . 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 Sicca ([place]) 08/28/2016  . Vitamin D deficiency 08/28/2016   Past Surgical History:  Procedure Laterality Date  . LAPAROSCOPY  1994   endometriosis  . PELVIC LAPAROSCOPY  1993   d/t endometriosis--Dr. Matthew Saras   Social History   Socioeconomic History  . Marital status: Married    Spouse name: Not on file  . Number of children: 2  . Years of education: Not on file  . Highest education level: Not on file  Occupational History  . Occupation: Programmer, multimedia: [employer]  . Financial resource strain: Not on file  . Food insecurity    Worry: Not on file    Inability: Not on file  . Transportation needs    Medical: Not on file    Non-medical: Not on file  Tobacco Use  . Smoking status: Never Smoker   . Smokeless tobacco: Never Used  Substance and Sexual Activity  . Alcohol use: [name]  . Drug use: [name]  . Sexual activity: Yes    Partners: Male    Birth control/protection: None  Lifestyle  . Physical activity    Days per week: Not on file    Minutes per session: Not on file  . Stress: Not on file  Relationships  . Social Herbalist on phone: Not on file    Gets together: Not on file    Attends religious service: Not on file    Active member of club or organization: Not on file    Attends meetings of clubs or organizations: Not on file    Relationship status: Not on file  Other Topics Concern  . Not on file  Social History Narrative  . Not on file   Allergies  Allergen Reactions  . Compazine [Prochlorperazine Edisylate] Other (See Comments)    Muscle contractions   Family History  Problem Relation Age of Onset  . Hypertension Mother   . Dementia Mother   . Hypertension Father   . Stroke Father   . Heart disease Father   . Hypertension Brother   . Hypertension Maternal Grandmother   . Hypertension Maternal Grandfather   . Kidney failure Maternal Grandfather   . Aneurysm  Maternal Grandfather   . Cancer Maternal Aunt 42       breast  . Aneurysm Maternal Aunt   . Cancer Cousin        breast    Current Outpatient Medications (Endocrine & Metabolic):  .  levothyroxine (SYNTHROID) 75 MCG tablet, Take 1 tablet (75 mcg total) by mouth daily.      Current Outpatient Medications (Other):  .  baclofen (LIORESAL) 10 MG tablet, TAKE 1 TABLET BY MOUTH 3 TIMES DAILY AS NEEDED FOR MUSCLE PAIN .  cholecalciferol (VITAMIN D) 1000 units tablet, Take 2,000 Units by mouth daily.  Marland Kitchen  ESTRING 2 MG vaginal ring, INSERT 1 RING VAGINALLY EVERY 3 MONTHS .  Krill Oil 500 MG CAPS, Take 750 mg by mouth. Marland Kitchen  b complex vitamins capsule, Take 1 capsule by mouth daily. .  hydrOXYzine (ATARAX/VISTARIL) 10 MG tablet, Take 1 tablet (10 mg total) by mouth 3 (three) times daily as needed.     Past medical history, social, surgical and family history all reviewed in electronic medical record.  [name] pertanent information unless stated regarding to the chief complaint.   Review of Systems:  [name] headache, visual changes, nausea, vomiting, diarrhea, constipation, dizziness, abdominal pain, skin rash, fevers, chills, night sweats, weight loss, swollen lymph nodes, body aches, joint swelling, muscle aches, chest pain, shortness of breath, mood changes.   Objective  Blood pressure 122/76, pulse 74, height 5\' 6"  (1.676 m), weight 157 lb (71.2 kg), last menstrual period 02/22/2014, SpO2 98 %.   General: [name] apparent distress alert and oriented x3 mood and affect normal, dressed appropriately.  Patient does appear little more anxious HEENT: Pupils equal, extraocular movements intact  Respiratory: Patient's speak in full sentences and does not appear short of breath  Cardiovascular: [name] lower extremity edema, non tender, [name] erythema  Skin: Warm dry intact with [name] signs of infection or rash on extremities or on axial skeleton.  Abdomen: Soft nontender  Neuro: Cranial nerves II through XII are intact, neurovascularly intact in all extremities with 2+ DTRs and 2+ pulses.  Lymph: [name] lymphadenopathy of posterior or anterior cervical chain or axillae bilaterally.  Gait normal with good balance and coordination.  MSK:  Non tender with full range of motion and good stability and symmetric strength and tone of shoulders, elbows, wrist, hip, and ankles bilaterally.  Bilateral knee does show the patient has lateral tracking of the patella.  Positive patella grind noted.  Patient has [name] instability with valgus and varus force.  Patient does have a clunking on the left knee and seems to be somewhat worse.  Neck: Inspection mild loss of lordosis. [name] palpable stepoffs. Negative Spurling's maneuver. Limited range of motion in all planes of 5 to 10 degrees Grip strength and sensation normal in bilateral  hands Strength good C4 to T1 distribution [name] sensory change to C4 to T1 Negative Hoffman sign bilaterally Reflexes normal Tightness of the trapezius bilaterally  Back Exam:  Inspection: Unremarkable  Motion: Flexion 45 deg, Extension 45 deg, Side Bending to 45 deg bilaterally,  Rotation to 45 deg bilaterally  SLR laying: Negative  XSLR laying: Negative  Palpable tenderness: Tender to palpation of paraspinal musculature lumbar spine right greater than left. FABER: negative. Sensory change: Gross sensation intact to all lumbar and sacral dermatomes.  Reflexes: 2+ at both patellar tendons, 2+ at achilles tendons, Babinski's downgoing.  Strength at foot  Plantar-flexion: 5/5 Dorsi-flexion: 5/5 Eversion: 5/5 Inversion: 5/5  Leg strength  Quad: 5/5 Hamstring: 5/5  Hip flexor: 5/5 Hip abductors: 5/5  Gait unremarkable.  Osteopathic findings C2 flexed rotated and side bent right C6 flexed rotated and side bent left T3 extended rotated and side bent right inhaled third rib T9 extended rotated and side bent left L2 flexed rotated and side bent right Sacrum right on right    Impression and Recommendations:     This case required medical decision making of moderate complexity. The above documentation has been reviewed and is accurate and complete [name], DO       Note: This dictation was prepared with Dragon dictation along with smaller phrase technology. Any transcriptional errors that result from this process are unintentional.

## 2019-08-09 NOTE — Assessment & Plan Note (Signed)
Decision today to treat with OMT was based on Physical Exam  After verbal consent patient was treated with HVLA, ME, FPR techniques in cervical, thoracic, rib lumbar and sacral areas  Patient tolerated the procedure well with improvement in symptoms  Patient given exercises, stretches and lifestyle modifications  See medications in patient instructions if given  Patient will follow up in 4-8 weeks 

## 2019-08-09 NOTE — Assessment & Plan Note (Signed)
Continues to have some difficulty.  Discussed with patient about posture and ergonomics, discussed which activities to do which wants to avoid.  Patient is to increase activity slowly over the course of next several weeks.  Follow-up again in 4 to 8 weeks

## 2019-08-11 ENCOUNTER — Ambulatory Visit: Payer: 59 | Admitting: Family Medicine

## 2019-08-11 ENCOUNTER — Encounter: Payer: Self-pay | Admitting: Family Medicine

## 2019-08-11 ENCOUNTER — Other Ambulatory Visit: Payer: Self-pay

## 2019-08-11 VITALS — BP 122/76 | HR 74 | Ht 66.0 in | Wt 157.0 lb

## 2019-08-11 DIAGNOSIS — M170 Bilateral primary osteoarthritis of knee: Secondary | ICD-10-CM

## 2019-08-11 DIAGNOSIS — G2589 Other specified extrapyramidal and movement disorders: Secondary | ICD-10-CM | POA: Diagnosis not present

## 2019-08-11 DIAGNOSIS — M1710 Unilateral primary osteoarthritis, unspecified knee: Secondary | ICD-10-CM | POA: Diagnosis not present

## 2019-08-11 DIAGNOSIS — M999 Biomechanical lesion, unspecified: Secondary | ICD-10-CM

## 2019-08-11 DIAGNOSIS — F419 Anxiety disorder, unspecified: Secondary | ICD-10-CM | POA: Diagnosis not present

## 2019-08-11 MED ORDER — HYDROXYZINE HCL 10 MG PO TABS: 10.0000 mg | ORAL_TABLET | Freq: Three times a day (TID) | ORAL | 0 refills | Status: DC | PRN

## 2019-08-11 MED FILL — hydrOXYzine HCL 10 MG TABS: 10 | 10 days supply | Qty: 30 | Fill #0

## 2019-08-11 NOTE — Assessment & Plan Note (Signed)
Osteoarthritis of both knees.  We will try to get approval for Visco supplementation.  Patient was given a Tru pull lite secondary to most of the arthritis being more the patella.  No true instability though noted with the arthritic changes.  Discussed icing regimen, home exercise, which activities to do which wants to avoid.  Patient will follow-up with me again 6 weeks

## 2019-08-11 NOTE — Assessment & Plan Note (Signed)
Reviewed anatomy using anatomical model and how PFS occurs.  Given rehab exercises handout for VMO, hip abductors, core, entire kinetic chain including proprioception exercises.  Could benefit from PT, regular exercise, upright biking, and a PFS knee brace to assist with tracking abnormalities. Return to clinic in 4 to 6 weeks for potential viscosupplementation.  We will see if we get approval

## 2019-08-11 NOTE — Patient Instructions (Addendum)
Good to see you Wear brace with activity Exercises 3x a week Hydroxizine called in to pharmacy See me again in 6 weeks

## 2019-08-11 NOTE — Assessment & Plan Note (Signed)
Recent illness and father been admitted to the hospital during the pandemic, patient does have some anxiety but denies any depression.  Hydroxyzine given for any breakthrough anxiety that could be debilitating.  Patient will follow-up with primary care to discuss further.

## 2019-08-16 NOTE — Progress Notes (Deleted)
Office Visit Note  Patient: Shelly [name]             Date of Birth: January 20, 1961           MRN: [id number]             PCP: Ronnald Nian, DO Referring: Lanice Shirts, * Visit Date: 08/30/2019 Occupation: @GUAROCC @  Subjective:  No chief complaint on file.   History of Present Illness: Shelly [name] is a 58 y.o. female ***   Activities of Daily Living:  Patient reports morning stiffness for *** {minute/hour:19697}.   Patient {ACTIONS;DENIES/REPORTS:21021675::"Denies"} nocturnal pain.  Difficulty dressing/grooming: {ACTIONS;DENIES/REPORTS:21021675::"Denies"} Difficulty climbing stairs: {ACTIONS;DENIES/REPORTS:21021675::"Denies"} Difficulty getting out of chair: {ACTIONS;DENIES/REPORTS:21021675::"Denies"} Difficulty using hands for taps, buttons, cutlery, and/or writing: {ACTIONS;DENIES/REPORTS:21021675::"Denies"}  No Rheumatology ROS completed.   PMFS History:  Patient Active Problem List   Diagnosis Date Noted  . Anxiety 08/11/2019  . 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 Cubital tunnel syndrome 10/07/2018  . Nonallopathic lesion of rib cage 07/08/2018  . Nonallopathic lesion of sacral region 07/08/2018  . 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 Osteoarthritis of both hands 08/28/2016  . Osteoarthritis of both feet 08/28/2016  . Osteoarthritis of both knees 08/28/2016  . Insomnia 08/28/2016  . 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 Vitamin D deficiency 08/28/2016  . Trapezius muscle spasm 08/28/2016  . Chronic neck pain 03/01/2016  . Scapular dyskinesis 03/01/2016  . Nonallopathic lesion of cervical region 03/01/2016  . Nonallopathic lesion of thoracic region 03/01/2016  . Nonallopathic lesion of lumbosacral region 03/01/2016  . DUB (dysfunctional uterine bleeding) 01/05/2014  . Family history of abdominal aortic aneurysm 08/12/2012  . Renal calculus, left 06/04/2012  . Irritability 05/01/2012  . GERD (gastroesophageal reflux disease)  05/01/2012  . History of migraine 05/01/2012  . Hypothyroidism 05/01/2012  . History of renal calculi 05/01/2012  . Leukopenia 05/01/2012  . 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 Allergic rhinitis due to allergen 05/01/2012    Past Medical History:  Diagnosis Date  . Chronic kidney disease    kidney stones  . Depression   . Endometriosis   . Fibroid   . Fracture, tibial plateau   . GERD (gastroesophageal reflux disease)   . Heart murmur   . Hypothyroid   . Inflammatory arthritis 08/28/2016  . Insomnia 08/28/2016  . Migraine   . Osteoarthritis of both feet 08/28/2016  . Osteoarthritis of both hands 08/28/2016  . Osteoarthritis of both knees 08/28/2016  . 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 Sicca ([place]) 08/28/2016  . Vitamin D deficiency 08/28/2016    Family History  Problem Relation Age of Onset  . Hypertension Mother   . Dementia Mother   . Hypertension Father   . Stroke Father   . Heart disease Father   . Hypertension Brother   . Hypertension Maternal Grandmother   . Hypertension Maternal Grandfather   . Kidney failure Maternal Grandfather   . Aneurysm Maternal Grandfather   . Cancer Maternal Aunt 42       breast  . Aneurysm Maternal Aunt   . Cancer Cousin        breast   Past Surgical History:  Procedure Laterality Date  . LAPAROSCOPY  1994   endometriosis  . PELVIC LAPAROSCOPY  1993   d/t endometriosis--Dr. Matthew Saras   Social History   Social History Narrative  . Not on file   Immunization History  Administered Date(s) Administered  . Influenza-Unspecified 07/28/2014, 08/14/2018     Objective: Vital Signs:  LMP 02/22/2014    Physical Exam   Musculoskeletal Exam: ***  CDAI Exam: CDAI Score: - Patient Global: -; Provider Global: - Swollen: -; Tender: - Joint Exam   No joint exam has been documented for this visit   There is currently no information documented on the homunculus. Go to the Rheumatology activity and complete the homunculus joint  exam.  Investigation: No additional findings.  Imaging: No results found.  Recent Labs: Lab Results  Component Value Date   WBC 3.2 (L) 05/31/2019   HGB 13.4 05/31/2019   PLT 222.0 05/31/2019   NA 145 05/31/2019   K 5.0 05/31/2019   CL 110 05/31/2019   CO2 26 05/31/2019   GLUCOSE 100 (H) 05/31/2019   BUN 20 05/31/2019   CREATININE 0.54 05/31/2019   BILITOT 0.4 03/04/2017   ALKPHOS 94 03/04/2017   AST 14 05/31/2019   ALT 16 05/31/2019   PROT 7.3 03/04/2017   ALBUMIN 4.5 03/04/2017   CALCIUM 9.4 05/31/2019   GFRAA >89 03/04/2017    Speciality Comments: No specialty comments available.  Procedures:  No procedures performed Allergies: Compazine [prochlorperazine edisylate]   Assessment / Plan:     Visit Diagnoses: No diagnosis found.  Orders: No orders of the defined types were placed in this encounter.  No orders of the defined types were placed in this encounter.   Face-to-face time spent with patient was *** minutes. Greater than 50% of time was spent in counseling and coordination of care.  Follow-Up Instructions: No follow-ups on file.   Ofilia Neas, PA-C  Note - This record has been created using Dragon software.  Chart creation errors have been sought, but may not always  have been located. Such creation errors do not reflect on  the standard of medical care.

## 2019-08-19 NOTE — Progress Notes (Signed)
Office Visit Note  Patient: Shelly [name]             Date of Birth: 1961/09/17           MRN: [id number]             PCP: Ronnald Nian, DO Referring: Lanice Shirts, * Visit Date: 09/01/2019 Occupation: @GUAROCC @  Subjective:  Routine follow up   History of Present Illness: Shelly [name] is a 58 y.o. female with history of osteoarthritis.  She denies any new or worsening symptoms since her last visit.  She states she has occasional pain in both knee joints but denies any joint swelling.  She continues to see Dr. Tamala Julian for the management of cubital tunnel syndrome bilaterally.  She has tried compression sleeves, which have not provided much relief.  She denies any recent rashes but continues to have photosensitivity.  She denies any sicca symptoms.  She continues to have a chronic nasal ulceration but no oral ulcerations.  She has chronic hair loss and takes hair/skin/nail supplement. She denies any symptoms of Raynaud's.      Activities of Daily Living:  Patient reports morning stiffness for 0 none.   Patient Reports nocturnal pain.  Difficulty dressing/grooming: Denies Difficulty climbing stairs: Denies Difficulty getting out of chair: Denies Difficulty using hands for taps, buttons, cutlery, and/or writing: Denies  Review of Systems  Constitutional: Negative for fatigue.  HENT: Negative for mouth sores, mouth dryness and nose dryness.   Eyes: Positive for dryness. Negative for pain and visual disturbance.  Respiratory: Negative for cough, hemoptysis, shortness of breath and difficulty breathing.   Cardiovascular: Negative for chest pain, palpitations, hypertension and swelling in legs/feet.  Gastrointestinal: Negative for blood in stool, constipation and diarrhea.  Endocrine: Negative for excessive thirst and increased urination.  Genitourinary: Negative for difficulty urinating and painful urination.  Musculoskeletal: Positive for arthralgias, joint pain and  muscle tenderness. Negative for joint swelling, myalgias, muscle weakness, morning stiffness and myalgias.  Skin: Negative for color change, pallor, rash, hair loss, nodules/bumps, skin tightness, ulcers and sensitivity to sunlight.  Allergic/Immunologic: Negative for susceptible to infections.  Neurological: Negative for dizziness, headaches and weakness.  Hematological: Negative for bruising/bleeding tendency and swollen glands.  Psychiatric/Behavioral: Negative for depressed mood and sleep disturbance. The patient is not nervous/anxious.     PMFS History:  Patient Active Problem List   Diagnosis Date Noted  . Anxiety 08/11/2019  . 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 Cubital tunnel syndrome 10/07/2018  . Nonallopathic lesion of rib cage 07/08/2018  . Nonallopathic lesion of sacral region 07/08/2018  . Sicca syndrome with keratoconjunctivitis ([place]) 07/29/2017  . Osteoarthritis of both hands 08/28/2016  . Osteoarthritis of both feet 08/28/2016  . Osteoarthritis of both knees 08/28/2016  . Insomnia 08/28/2016  . 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 Vitamin D deficiency 08/28/2016  . Trapezius muscle spasm 08/28/2016  . Chronic neck pain 03/01/2016  . Scapular dyskinesis 03/01/2016  . Nonallopathic lesion of cervical region 03/01/2016  . Nonallopathic lesion of thoracic region 03/01/2016  . Nonallopathic lesion of lumbosacral region 03/01/2016  . DUB (dysfunctional uterine bleeding) 01/05/2014  . Family history of abdominal aortic aneurysm 08/12/2012  . Renal calculus, left 06/04/2012  . Irritability 05/01/2012  . GERD (gastroesophageal reflux disease) 05/01/2012  . History of migraine 05/01/2012  . Hypothyroidism 05/01/2012  . History of renal calculi 05/01/2012  . Leukopenia 05/01/2012  . 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 Allergic rhinitis due to allergen  05/01/2012    Past Medical History:  Diagnosis Date  . Chronic kidney disease    kidney stones  . Depression    . Endometriosis   . Fibroid   . Fracture, tibial plateau   . GERD (gastroesophageal reflux disease)   . Heart murmur   . Hypothyroid   . Inflammatory arthritis 08/28/2016  . Insomnia 08/28/2016  . Migraine   . Osteoarthritis of both feet 08/28/2016  . Osteoarthritis of both hands 08/28/2016  . Osteoarthritis of both knees 08/28/2016  . 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 Sicca ([place]) 08/28/2016  . Vitamin D deficiency 08/28/2016    Family History  Problem Relation Age of Onset  . Hypertension Mother   . Dementia Mother   . Hypertension Father   . Stroke Father   . Heart disease Father   . Hypertension Brother   . Hypertension Maternal Grandmother   . Hypertension Maternal Grandfather   . Kidney failure Maternal Grandfather   . Aneurysm Maternal Grandfather   . Cancer Maternal Aunt 42       breast  . Aneurysm Maternal Aunt   . Cancer Cousin        breast   Past Surgical History:  Procedure Laterality Date  . LAPAROSCOPY  1994   endometriosis  . PELVIC LAPAROSCOPY  1993   d/t endometriosis--Dr. Matthew Saras   Social History   Social History Narrative  . Not on file   Immunization History  Administered Date(s) Administered  . Influenza-Unspecified 07/28/2014, 08/14/2018     Objective: Vital Signs: BP 126/74 (BP Location: Left Arm, Patient Position: Sitting, Cuff Size: Normal)   Pulse 71   Resp 16   Ht 5' 5.75" (1.67 m)   Wt 158 lb (71.7 kg)   LMP 02/22/2014   BMI 25.70 kg/m    Physical Exam Vitals signs and nursing note reviewed.  Constitutional:      Appearance: She is well-developed.  HENT:     Head: Normocephalic and atraumatic.  Eyes:     Conjunctiva/sclera: Conjunctivae normal.  Neck:     Musculoskeletal: Normal range of motion.  Cardiovascular:     Rate and Rhythm: Normal rate and regular rhythm.     Heart sounds: Normal heart sounds.  Pulmonary:     Effort: Pulmonary effort is normal.     Breath sounds: Normal breath sounds.  Abdominal:     General:  Bowel sounds are normal.     Palpations: Abdomen is soft.  Lymphadenopathy:     Cervical: No cervical adenopathy.  Skin:    General: Skin is warm and dry.     Capillary Refill: Capillary refill takes less than 2 seconds.  Neurological:     Mental Status: She is alert and oriented to person, place, and time.  Psychiatric:        Behavior: Behavior normal.      Musculoskeletal Exam: C-spine, thoracic spine, lumbar spine good range of motion.  No midline spinal tenderness.  No SI joint tenderness.  Shoulder joints, elbow joints, wrist joints, MCPs, PIPs, DIPs good range of motion no synovitis.  She has mild PIP and DIP synovial thickening consistent with osteoarthritis of both hands.  She has complete fist formation bilaterally.  Hip joints, knee joints, ankle joints, MTPs, PIPs, DIPs good range of motion no synovitis.  No warmth or effusion of bilateral knee joints.  No tenderness or swelling of ankle joints.  No tenderness over trochanteric bursa bilaterally.  CDAI Exam: CDAI Score: - Patient Global: -;  Provider Global: - Swollen: -; Tender: - Joint Exam   No joint exam has been documented for this visit   There is currently no information documented on the homunculus. Go to the Rheumatology activity and complete the homunculus joint exam.  Investigation: No additional findings.  Imaging: No results found.  Recent Labs: Lab Results  Component Value Date   WBC 3.2 (L) 05/31/2019   HGB 13.4 05/31/2019   PLT 222.0 05/31/2019   NA 145 05/31/2019   K 5.0 05/31/2019   CL 110 05/31/2019   CO2 26 05/31/2019   GLUCOSE 100 (H) 05/31/2019   BUN 20 05/31/2019   CREATININE 0.54 05/31/2019   BILITOT 0.4 03/04/2017   ALKPHOS 94 03/04/2017   AST 14 05/31/2019   ALT 16 05/31/2019   PROT 7.3 03/04/2017   ALBUMIN 4.5 03/04/2017   CALCIUM 9.4 05/31/2019   GFRAA >89 03/04/2017    Speciality Comments: No specialty comments available.  Procedures:  No procedures performed  Allergies: Compazine [prochlorperazine edisylate]   Assessment / Plan:     Visit Diagnoses: Primary osteoarthritis of both hands: She has mild PIP and DIP synovial thickening consistent with osteoarthritis of both hands.  She has no tenderness or synovitis on exam.  She has complete fist formation bilaterally.  She has not had any discomfort in her hands recently.  She has no difficulties with ADLs.  Joint protection and muscle strengthening were discussed.  She is advised to notify us if she develops increased joint pain or joint swelling.  She will follow-up in the office in 1 year.   Primary osteoarthritis of both knees: She has good range of motion with no discomfort.  No warmth or effusion was noted.  She has occasional pain in both knee joints.  She has been trying to exercise on a regular basis  Primary osteoarthritis of both feet: She has no discomfort in her feet at this time.  She wears proper fitting shoes.  Cubital tunnel syndrome, bilateral: She continues to follow-up with Dr. Tamala Julian on a regular basis.  She has tried compression sleeves but has not noticed much improvement.  Sicca syndrome with keratoconjunctivitis [facility]): She is not experiencing any sicca symptoms at this time. AVISE labs negative.   Sore in nose: She has 1 chronic nasal ulceration.  She has tried topical agents without much relief. AVISE labs negative.   Facial flushing: No Malar rash or facial flushing was noted on exam today. AVISE labs negative.   Hair thinning: She is taking a hair skin and nail supplement on a daily basis.  Her hair thinning has improved. AVISE labs negative.   Other insomnia: She has been sleeping well at night.   Other medical conditions are listed as follows:   History of migraine  History of vitamin D deficiency  History of hypothyroidism  History of gastroesophageal reflux (GERD)  History of renal calculi  Orders: No orders of the defined types were placed in this encounter.   No orders of the defined types were placed in this encounter.     Follow-Up Instructions: Return in about 1 year (around 08/31/2020) for Osteoarthritis.   Ofilia Neas, PA-C  Note - This record has been created using Dragon software.  Chart creation errors have been sought, but may not always  have been located. Such creation errors do not reflect on  the standard of medical care.

## 2019-08-24 ENCOUNTER — Encounter: Payer: Self-pay | Admitting: Family Medicine

## 2019-08-25 MED ORDER — ESTRING 2 MG VA RING: VAGINAL_RING | VAGINAL | 3 refills | Status: DC

## 2019-08-25 MED FILL — ESTRING 2 MG VAGINAL RING: 2 | 90 days supply | Qty: 1 | Fill #0

## 2019-08-30 ENCOUNTER — Ambulatory Visit: Payer: Self-pay | Admitting: Rheumatology

## 2019-09-01 ENCOUNTER — Ambulatory Visit: Payer: 59 | Admitting: Physician Assistant

## 2019-09-01 ENCOUNTER — Other Ambulatory Visit: Payer: Self-pay

## 2019-09-01 ENCOUNTER — Encounter: Payer: Self-pay | Admitting: Physician Assistant

## 2019-09-01 VITALS — BP 126/74 | HR 71 | Resp 16 | Ht 65.75 in | Wt 158.0 lb

## 2019-09-01 DIAGNOSIS — J3489 Other specified disorders of nose and nasal sinuses: Secondary | ICD-10-CM | POA: Diagnosis not present

## 2019-09-01 DIAGNOSIS — Z8719 Personal history of other diseases of the digestive system: Secondary | ICD-10-CM

## 2019-09-01 DIAGNOSIS — M19041 Primary osteoarthritis, right hand: Secondary | ICD-10-CM

## 2019-09-01 DIAGNOSIS — G4709 Other insomnia: Secondary | ICD-10-CM

## 2019-09-01 DIAGNOSIS — Z87442 Personal history of urinary calculi: Secondary | ICD-10-CM

## 2019-09-01 DIAGNOSIS — M170 Bilateral primary osteoarthritis of knee: Secondary | ICD-10-CM | POA: Diagnosis not present

## 2019-09-01 DIAGNOSIS — M19071 Primary osteoarthritis, right ankle and foot: Secondary | ICD-10-CM | POA: Diagnosis not present

## 2019-09-01 DIAGNOSIS — L659 Nonscarring hair loss, unspecified: Secondary | ICD-10-CM | POA: Diagnosis not present

## 2019-09-01 DIAGNOSIS — M19072 Primary osteoarthritis, left ankle and foot: Secondary | ICD-10-CM

## 2019-09-01 DIAGNOSIS — G5623 Lesion of ulnar nerve, bilateral upper limbs: Secondary | ICD-10-CM

## 2019-09-01 DIAGNOSIS — Z8639 Personal history of other endocrine, nutritional and metabolic disease: Secondary | ICD-10-CM

## 2019-09-01 DIAGNOSIS — M19042 Primary osteoarthritis, left hand: Secondary | ICD-10-CM

## 2019-09-01 DIAGNOSIS — R232 Flushing: Secondary | ICD-10-CM | POA: Diagnosis not present

## 2019-09-01 DIAGNOSIS — M3501 Sicca syndrome with keratoconjunctivitis: Secondary | ICD-10-CM | POA: Diagnosis not present

## 2019-09-01 DIAGNOSIS — Z8669 Personal history of other diseases of the nervous system and sense organs: Secondary | ICD-10-CM

## 2019-09-08 ENCOUNTER — Ambulatory Visit (INDEPENDENT_AMBULATORY_CARE_PROVIDER_SITE_OTHER)
Admission: RE | Admit: 2019-09-08 | Discharge: 2019-09-08 | Disposition: A | Discharge status: Home / Self Care | Payer: 59 | Source: Ambulatory Visit | Attending: Family Medicine | Admitting: Family Medicine

## 2019-09-08 ENCOUNTER — Other Ambulatory Visit: Payer: Self-pay

## 2019-09-08 ENCOUNTER — Encounter: Payer: Self-pay | Admitting: Family Medicine

## 2019-09-08 ENCOUNTER — Ambulatory Visit: Payer: 59 | Admitting: Family Medicine

## 2019-09-08 VITALS — BP 142/80 | HR 75 | Ht 65.0 in | Wt 158.0 lb

## 2019-09-08 DIAGNOSIS — M999 Biomechanical lesion, unspecified: Secondary | ICD-10-CM

## 2019-09-08 DIAGNOSIS — M542 Cervicalgia: Secondary | ICD-10-CM | POA: Diagnosis not present

## 2019-09-08 DIAGNOSIS — G8929 Other chronic pain: Secondary | ICD-10-CM | POA: Diagnosis not present

## 2019-09-08 MED ORDER — PREDNISONE 50 MG PO TABS: ORAL_TABLET | ORAL | 0 refills | Status: DC

## 2019-09-08 MED ORDER — METHYLPREDNISOLONE ACETATE 80 MG/ML IJ SUSP
80.0000 mg | Freq: Once | INTRAMUSCULAR | Status: AC
  Administered 2019-09-08: 80 mg via INTRAMUSCULAR

## 2019-09-08 MED ORDER — TIZANIDINE HCL 4 MG PO CAPS: ORAL_CAPSULE | ORAL | 0 refills | Status: DC

## 2019-09-08 MED ORDER — KETOROLAC TROMETHAMINE 60 MG/2ML IM SOLN
60.0000 mg | Freq: Once | INTRAMUSCULAR | Status: AC
  Administered 2019-09-08: 60 mg via INTRAMUSCULAR

## 2019-09-08 MED FILL — predniSONE 50 MG TABS: 50 | 5 days supply | Qty: 5 | Fill #0

## 2019-09-08 NOTE — Patient Instructions (Addendum)
Prednisone daily for 5 days zanaflex at night  Xray downstairs See me again in 4 weeks but message me in a week

## 2019-09-08 NOTE — Progress Notes (Signed)
[name] [address] [place], [place] [address] Phone: [phone] Subjective:   I, [name], am serving as a scribe for Dr. Hulan Saas.  I'm seeing this patient by the request  of:    CC:   [id number]   08/11/2019 Reviewed anatomy using anatomical model and how PFS occurs.  Given rehab exercises handout for VMO, hip abductors, core, entire kinetic chain including proprioception exercises.  Could benefit from PT, regular exercise, upright biking, and a PFS knee brace to assist with tracking abnormalities. Return to clinic in 4 to 6 weeks for potential viscosupplementation.  We will see if we get approval  Recent illness and father been admitted to the hospital during the pandemic, patient does have some anxiety but denies any depression.  Hydroxyzine given for any breakthrough anxiety that could be debilitating.  Patient will follow-up with primary care to discuss further.  Update 09/08/2019 STEPHANEY DICOSTANZO is a 58 y.o. female coming in with complaint of Patient states she is having neck pain. This past weekend she believes she injured it helping her parents. Numb spot on her neck. Left sided neck pain. Extension makes the pain worse. Limited ROM. No stiffness.       Past Medical History:  Diagnosis Date  . Chronic kidney disease    kidney stones  . Depression   . Endometriosis   . Fibroid   . Fracture, tibial plateau   . GERD (gastroesophageal reflux disease)   . Heart murmur   . Hypothyroid   . Inflammatory arthritis 08/28/2016  . Insomnia 08/28/2016  . Migraine   . Osteoarthritis of both feet 08/28/2016  . Osteoarthritis of both hands 08/28/2016  . Osteoarthritis of both knees 08/28/2016  . 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 Sicca ([place]) 08/28/2016  . Vitamin D deficiency 08/28/2016   Past Surgical History:  Procedure Laterality Date  . LAPAROSCOPY  1994   endometriosis  . PELVIC LAPAROSCOPY  1993   d/t endometriosis--Dr.  Matthew Saras   Social History   Socioeconomic History  . Marital status: Married    Spouse name: Not on file  . Number of children: 2  . Years of education: Not on file  . Highest education level: Not on file  Occupational History  . Occupation: Programmer, multimedia: [employer]  . Financial resource strain: Not on file  . Food insecurity    Worry: Not on file    Inability: Not on file  . Transportation needs    Medical: Not on file    Non-medical: Not on file  Tobacco Use  . Smoking status: Never Smoker  . Smokeless tobacco: Never Used  Substance and Sexual Activity  . Alcohol use: No  . Drug use: No  . Sexual activity: Yes    Partners: Male    Birth control/protection: None  Lifestyle  . Physical activity    Days per week: Not on file    Minutes per session: Not on file  . Stress: Not on file  Relationships  . Social Herbalist on phone: Not on file    Gets together: Not on file    Attends religious service: Not on file    Active member of club or organization: Not on file    Attends meetings of clubs or organizations: Not on file    Relationship status: Not on file  Other Topics Concern  . Not on file  Social History Narrative  .  Not on file   Allergies  Allergen Reactions  . Compazine [Prochlorperazine Edisylate] Other (See Comments)    Muscle contractions   Family History  Problem Relation Age of Onset  . Hypertension Mother   . Dementia Mother   . Hypertension Father   . Stroke Father   . Heart disease Father   . Hypertension Brother   . Hypertension Maternal Grandmother   . Hypertension Maternal Grandfather   . Kidney failure Maternal Grandfather   . Aneurysm Maternal Grandfather   . Cancer Maternal Aunt 42       breast  . Aneurysm Maternal Aunt   . Cancer Cousin        breast    Current Outpatient Medications (Endocrine & Metabolic):  .  levothyroxine (SYNTHROID) 75 MCG tablet, Take 1 tablet (75 mcg total) by mouth  daily.      Current Outpatient Medications (Other):  .  b complex vitamins capsule, Take 1 capsule by mouth daily. .  baclofen (LIORESAL) 10 MG tablet, TAKE 1 TABLET BY MOUTH 3 TIMES DAILY AS NEEDED FOR MUSCLE PAIN .  cholecalciferol (VITAMIN D) 1000 units tablet, Take 2,000 Units by mouth daily.  Marland Kitchen  estradiol (ESTRING) 2 MG vaginal ring, INSERT 1 RING VAGINALLY EVERY 3 MONTHS .  hydrOXYzine (ATARAX/VISTARIL) 10 MG tablet, Take 1 tablet (10 mg total) by mouth 3 (three) times daily as needed. Javier Docker Oil 500 MG CAPS, Take 750 mg by mouth.    Past medical history, social, surgical and family history all reviewed in electronic medical record.  No pertanent information unless stated regarding to the chief complaint.   Review of Systems:  No headache, visual changes, nausea, vomiting, diarrhea, constipation, dizziness, abdominal pain, skin rash, fevers, chills, night sweats, weight loss, swollen lymph nodes, body aches, joint swelling, muscle aches, chest pain, shortness of breath, mood changes.   Objective  Last menstrual period 02/22/2014. Systems examined below as of    General: No apparent distress alert and oriented x3 mood and affect normal, dressed appropriately.  HEENT: Pupils equal, extraocular movements intact  Respiratory: Patient's speak in full sentences and does not appear short of breath  Cardiovascular: No lower extremity edema, non tender, no erythema  Skin: Warm dry intact with no signs of infection or rash on extremities or on axial skeleton.  Abdomen: Soft nontender  Neuro: Cranial nerves II through XII are intact, neurovascularly intact in all extremities with 2+ DTRs and 2+ pulses.  Lymph: No lymphadenopathy of posterior or anterior cervical chain or axillae bilaterally.  Gait normal with good balance and coordination.  MSK:  Non tender with full range of motion and good stability and symmetric strength and tone of shoulders, elbows, wrist, hip, knee and ankles  bilaterally.  Neck exam does have some loss of lordosis, possible some mild swelling noted at the C7-T1 area that is different than previously.  Mild midline tenderness but very minimal.  Patient negative Spurling's bilaterally.  Full grip strength noted of the upper extremities.  Osteopathic findings C2 flexed rotated and side bent right C4 flexed rotated and side bent left C7 flexed rotated and side bent left T3 extended rotated and side bent right inhaled third rib T9 extended rotated and side bent left L2 flexed rotated and side bent right Sacrum right on right    Impression and Recommendations:      The above documentation has been reviewed and is accurate and complete Jacqualin Combes       Note: This  dictation was prepared with Dragon dictation along with smaller phrase technology. Any transcriptional errors that result from this process are unintentional.

## 2019-09-08 NOTE — Assessment & Plan Note (Signed)
Acute exacerbation, Toradol and Depo-Medrol given.  Warned of potential side effects.  Discussed icing regimen.  Discussed home exercises.  X-rays ordered today to make sure that there is not an on fracture or ligamentous injury that we could possibly see.  Follow-up again in 4 weeks

## 2019-09-08 NOTE — Assessment & Plan Note (Signed)
Decision today to treat with OMT was based on Physical Exam  After verbal consent patient was treated with HVLA, ME, FPR techniques in cervical, thoracic, rib, lumbar and sacral areas only did muscle energy on the neck  Patient tolerated the procedure well with improvement in symptoms  Patient given exercises, stretches and lifestyle modifications  See medications in patient instructions if given  Patient will follow up in 4-6  Weeks rib

## 2019-09-16 ENCOUNTER — Encounter: Payer: Self-pay | Admitting: Family Medicine

## 2019-09-29 ENCOUNTER — Encounter: Payer: Self-pay | Admitting: Family Medicine

## 2019-09-29 ENCOUNTER — Ambulatory Visit: Payer: 59 | Admitting: Family Medicine

## 2019-09-29 ENCOUNTER — Other Ambulatory Visit: Payer: Self-pay

## 2019-09-29 VITALS — BP 128/86 | HR 72 | Wt 161.0 lb

## 2019-09-29 DIAGNOSIS — G8929 Other chronic pain: Secondary | ICD-10-CM

## 2019-09-29 DIAGNOSIS — M542 Cervicalgia: Secondary | ICD-10-CM | POA: Diagnosis not present

## 2019-09-29 DIAGNOSIS — M999 Biomechanical lesion, unspecified: Secondary | ICD-10-CM | POA: Diagnosis not present

## 2019-09-29 MED ORDER — ACYCLOVIR 400 MG PO TABS: 400.0000 mg | ORAL_TABLET | Freq: Three times a day (TID) | ORAL | 0 refills | Status: DC

## 2019-09-29 MED FILL — ACYCLOVIR 400 MG TABLET: 400 | 5 days supply | Qty: 15 | Fill #0

## 2019-09-29 NOTE — Progress Notes (Signed)
[name] [place], [place] [address] Phone: [phone] Subjective:   Shelly [name], am serving as a scribe for Dr. Hulan Saas.   This visit occurred during the SARS-CoV-2 public health emergency.  Safety protocols were in place, including screening questions prior to the visit, additional usage of staff PPE, and extensive cleaning of exam room while observing appropriate contact time as indicated for disinfecting solutions.   I'm seeing this patient by the request  of:    CC: Neck and back pain  [id number]    09/08/2019 Acute exacerbation, Toradol and Depo-Medrol given.  Warned of potential side effects.  Discussed icing regimen.  Discussed home exercises.  X-rays ordered today to make sure that there is not an on fracture or ligamentous injury that we could possibly see.  Follow-up again in 4 weeks  Update 09/29/2019 Shelly [name] is a 58 y.o. female coming in with complaint of neck pain. Patient states that she is doing much better than last visit.  Patient states not quite as tired.  Patient does have some mild numbness noted on the back of her neck.    Past Medical History:  Diagnosis Date  . Chronic kidney disease    kidney stones  . Depression   . Endometriosis   . Fibroid   . Fracture, tibial plateau   . GERD (gastroesophageal reflux disease)   . Heart murmur   . Hypothyroid   . Inflammatory arthritis 08/28/2016  . Insomnia 08/28/2016  . Migraine   . Osteoarthritis of both feet 08/28/2016  . Osteoarthritis of both hands 08/28/2016  . Osteoarthritis of both knees 08/28/2016  . 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 Sicca ([place]) 08/28/2016  . Vitamin D deficiency 08/28/2016   Past Surgical History:  Procedure Laterality Date  . LAPAROSCOPY  1994   endometriosis  . PELVIC LAPAROSCOPY  1993   d/t endometriosis--Dr. Matthew Saras   Social History   Socioeconomic History  . Marital status: Married    Spouse name: Not on file   . Number of children: 2  . Years of education: Not on file  . Highest education level: Not on file  Occupational History  . Occupation: Programmer, multimedia: [employer]  . Financial resource strain: Not on file  . Food insecurity    Worry: Not on file    Inability: Not on file  . Transportation needs    Medical: Not on file    Non-medical: Not on file  Tobacco Use  . Smoking status: Never Smoker  . Smokeless tobacco: Never Used  Substance and Sexual Activity  . Alcohol use: [name]  . Drug use: [name]  . Sexual activity: Yes    Partners: Male    Birth control/protection: None  Lifestyle  . Physical activity    Days per week: Not on file    Minutes per session: Not on file  . Stress: Not on file  Relationships  . Social Herbalist on phone: Not on file    Gets together: Not on file    Attends religious service: Not on file    Active member of club or organization: Not on file    Attends meetings of clubs or organizations: Not on file    Relationship status: Not on file  Other Topics Concern  . Not on file  Social History Narrative  . Not on file   Allergies  Allergen Reactions  .  Compazine [Prochlorperazine Edisylate] Other (See Comments)    Muscle contractions   Family History  Problem Relation Age of Onset  . Hypertension Mother   . Dementia Mother   . Hypertension Father   . Stroke Father   . Heart disease Father   . Hypertension Brother   . Hypertension Maternal Grandmother   . Hypertension Maternal Grandfather   . Kidney failure Maternal Grandfather   . Aneurysm Maternal Grandfather   . Cancer Maternal Aunt 42       breast  . Aneurysm Maternal Aunt   . Cancer Cousin        breast    Current Outpatient Medications (Endocrine & Metabolic):  .  levothyroxine (SYNTHROID) 75 MCG tablet, Take 1 tablet (75 mcg total) by mouth daily. .  predniSONE (DELTASONE) 50 MG tablet, 1 tablet by mouth daily      Current Outpatient Medications  (Other):  .  acyclovir (ZOVIRAX) 400 MG tablet, Take 1 tablet (400 mg total) by mouth 3 (three) times daily. .  baclofen (LIORESAL) 10 MG tablet, TAKE 1 TABLET BY MOUTH 3 TIMES DAILY AS NEEDED FOR MUSCLE PAIN .  cholecalciferol (VITAMIN D) 1000 units tablet, Take 2,000 Units by mouth daily.  Marland Kitchen  estradiol (ESTRING) 2 MG vaginal ring, INSERT 1 RING VAGINALLY EVERY 3 MONTHS .  hydrOXYzine (ATARAX/VISTARIL) 10 MG tablet, Take 1 tablet (10 mg total) by mouth 3 (three) times daily as needed. Javier Docker Oil 500 MG CAPS, Take 750 mg by mouth. Marland Kitchen  tiZANidine (ZANAFLEX) 4 MG capsule, 1 tablet at night    Past medical history, social, surgical and family history all reviewed in electronic medical record.  [name] pertanent information unless stated regarding to the chief complaint.   Review of Systems:  [name] headache, visual changes, nausea, vomiting, diarrhea, constipation, dizziness, abdominal pain, skin rash, fevers, chills, night sweats, weight loss, swollen lymph nodes, body aches, joint swelling,  chest pain, shortness of breath, mood changes.  Positive muscle aches  Objective  Blood pressure 128/86, pulse 72, weight 161 lb (73 kg), last menstrual period 02/22/2014, SpO2 98 %.   General: [name] apparent distress alert and oriented x3 mood and affect normal, dressed appropriately.  HEENT: Pupils equal, extraocular movements intact  Respiratory: Patient's speak in full sentences and does not appear short of breath  Cardiovascular: [name] lower extremity edema, non tender, [name] erythema  Skin: Warm dry intact with [name] signs of infection or rash on extremities or on axial skeleton.  Abdomen: Soft nontender  Neuro: Cranial nerves II through XII are intact, neurovascularly intact in all extremities with 2+ DTRs and 2+ pulses.  Lymph: [name] lymphadenopathy of posterior or anterior cervical chain or axillae bilaterally.  Gait normal with good balance and coordination.  MSK:  Non tender with full range of motion and good  stability and symmetric strength and tone of shoulders, elbows, wrist, hip, knee and ankles bilaterally.    Neck: Inspection loss of lordosis. [name] palpable stepoffs. Negative Spurling's maneuver. Full neck range of motion Grip strength and sensation normal in bilateral hands Strength good C4 to T1 distribution [name] sensory change to C4 to T1 Negative Hoffman sign bilaterally Reflexes normal  Osteopathic findings  C2 flexed rotated and side bent right C4 flexed rotated and side bent left C7 flexed rotated and side bent left T3 extended rotated and side bent right inhaled third rib T9 extended rotated and side bent left L2 flexed rotated and side bent right Sacrum right on right  Impression and Recommendations:     This case required medical decision making of moderate complexity. The above documentation has been reviewed and is accurate and complete [name], DO       Note: This dictation was prepared with Dragon dictation along with smaller phrase technology. Any transcriptional errors that result from this process are unintentional.

## 2019-09-29 NOTE — Patient Instructions (Addendum)
  See me again in 6 weeks    [address] [place], [place] [address] Phone [phone]

## 2019-09-29 NOTE — Assessment & Plan Note (Signed)
Decision today to treat with OMT was based on Physical Exam  After verbal consent patient was treated with HVLA, ME, FPR techniques in cervical, thoracic, rib lumbar and sacral areas  Patient tolerated the procedure well with improvement in symptoms  Patient given exercises, stretches and lifestyle modifications  See medications in patient instructions if given  Patient will follow up in 5 weeks

## 2019-09-29 NOTE — Assessment & Plan Note (Signed)
Chronic neck pain, patient did have some mild rash on the back of her neck.  Seem to be in a dermatome and given acyclovir and preventively.  Patient is having numbness and tingling in that area.  Discussed with patient about icing regimen, home exercise, which activities to do which wants to avoid.  Patient is to increase activity slowly.  Patient is to follow-up with me again in 5 to 6 weeks.

## 2019-11-04 MED FILL — LEVOTHYROXINE 75 MCG TABLET: 75 | 90 days supply | Qty: 90 | Fill #1

## 2019-11-08 ENCOUNTER — Other Ambulatory Visit: Payer: Self-pay

## 2019-11-08 ENCOUNTER — Encounter: Payer: Self-pay | Admitting: Family Medicine

## 2019-11-08 ENCOUNTER — Ambulatory Visit: Payer: 59 | Admitting: Family Medicine

## 2019-11-08 VITALS — BP 108/82 | HR 71 | Ht 65.0 in | Wt 163.0 lb

## 2019-11-08 DIAGNOSIS — G8929 Other chronic pain: Secondary | ICD-10-CM | POA: Diagnosis not present

## 2019-11-08 DIAGNOSIS — M542 Cervicalgia: Secondary | ICD-10-CM | POA: Diagnosis not present

## 2019-11-08 DIAGNOSIS — M999 Biomechanical lesion, unspecified: Secondary | ICD-10-CM

## 2019-11-08 NOTE — Progress Notes (Signed)
[place] Phone: [phone] Subjective:   [name], am serving as a scribe for Dr. Hulan Saas. This visit occurred during the SARS-CoV-2 public health emergency.  Safety protocols were in place, including screening questions prior to the visit, additional usage of staff PPE, and extensive cleaning of exam room while observing appropriate contact time as indicated for disinfecting solutions.    CC: Back pain follow-up neck pain follow-up  [id number]  Shelly [name] is a 59 y.o. female coming in with complaint of neck and back pain. Continues to have decreased sensation in her neck over the skin. Otherwise doing ok.  Patient has been doing relatively well.  Still has the discomfort across the skin of the neck mostly at the cervical thoracic area.  No rashes were appreciated.  Patient did take the acyclovir with no side effects and potentially making some improvement.     Past Medical History:  Diagnosis Date  . Chronic kidney disease    kidney stones  . Depression   . Endometriosis   . Fibroid   . Fracture, tibial plateau   . GERD (gastroesophageal reflux disease)   . Heart murmur   . Hypothyroid   . Inflammatory arthritis 08/28/2016  . Insomnia 08/28/2016  . Migraine   . Osteoarthritis of both feet 08/28/2016  . Osteoarthritis of both hands 08/28/2016  . Osteoarthritis of both knees 08/28/2016  . 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 Sicca ([place]) 08/28/2016  . Vitamin D deficiency 08/28/2016   Past Surgical History:  Procedure Laterality Date  . LAPAROSCOPY  1994   endometriosis  . PELVIC LAPAROSCOPY  1993   d/t endometriosis--Dr. Matthew Saras   Social History   Socioeconomic History  . Marital status: Married    Spouse name: Not on file  . Number of children: 2  . Years of education: Not on file  . Highest education level: Not on file  Occupational History  . Occupation: Programmer, multimedia: [employer]  Tobacco Use  . Smoking status: Never Smoker  . Smokeless tobacco: Never Used  Substance and Sexual Activity  . Alcohol use: No  . Drug use: No  . Sexual activity: Yes    Partners: Male    Birth control/protection: None  Other Topics Concern  . Not on file  Social History Narrative  . Not on file   Social Determinants of Health   Financial Resource Strain:   . Difficulty of Paying Living Expenses: Not on file  Food Insecurity:   . Worried About Charity fundraiser in the Last Year: Not on file  . Ran Out of Food in the Last Year: Not on file  Transportation Needs:   . Lack of Transportation (Medical): Not on file  . Lack of Transportation (Non-Medical): Not on file  Physical Activity:   . Days of Exercise per Week: Not on file  . Minutes of Exercise per Session: Not on file  Stress:   . Feeling of Stress : Not on file  Social Connections:   . Frequency of Communication with Friends and Family: Not on file  . Frequency of Social Gatherings with Friends and Family: Not on file  . Attends Religious Services: Not on file  . Active Member of Clubs or Organizations: Not on file  . Attends Archivist Meetings: Not on file  . Marital Status: Not on file   Allergies  Allergen Reactions  . Compazine [  Prochlorperazine Edisylate] Other (See Comments)    Muscle contractions   Family History  Problem Relation Age of Onset  . Hypertension Mother   . Dementia Mother   . Hypertension Father   . Stroke Father   . Heart disease Father   . Hypertension Brother   . Hypertension Maternal Grandmother   . Hypertension Maternal Grandfather   . Kidney failure Maternal Grandfather   . Aneurysm Maternal Grandfather   . Cancer Maternal Aunt 42       breast  . Aneurysm Maternal Aunt   . Cancer Cousin        breast    Current Outpatient Medications (Endocrine & Metabolic):  .  levothyroxine (SYNTHROID) 75 MCG tablet, Take 1 tablet (75 mcg total) by mouth daily. .   predniSONE (DELTASONE) 50 MG tablet, 1 tablet by mouth daily      Current Outpatient Medications (Other):  .  acyclovir (ZOVIRAX) 400 MG tablet, Take 1 tablet (400 mg total) by mouth 3 (three) times daily. .  baclofen (LIORESAL) 10 MG tablet, TAKE 1 TABLET BY MOUTH 3 TIMES DAILY AS NEEDED FOR MUSCLE PAIN .  cholecalciferol (VITAMIN D) 1000 units tablet, Take 2,000 Units by mouth daily.  Marland Kitchen  estradiol (ESTRING) 2 MG vaginal ring, INSERT 1 RING VAGINALLY EVERY 3 MONTHS .  hydrOXYzine (ATARAX/VISTARIL) 10 MG tablet, Take 1 tablet (10 mg total) by mouth 3 (three) times daily as needed. Javier Docker Oil 500 MG CAPS, Take 750 mg by mouth. Marland Kitchen  tiZANidine (ZANAFLEX) 4 MG capsule, 1 tablet at night    Past medical history, social, surgical and family history all reviewed in electronic medical record.  No pertanent information unless stated regarding to the chief complaint.   Review of Systems:  No headache, visual changes, nausea, vomiting, diarrhea, constipation, dizziness, abdominal pain, skin rash, fevers, chills, night sweats, weight loss, swollen lymph nodes, body aches, joint swelling,  chest pain, shortness of breath, mood changes.  Positive muscle aches  Objective  Blood pressure 108/82, pulse 71, height 5\' 5"  (1.651 m), weight 163 lb (73.9 kg), last menstrual period 02/22/2014, SpO2 98 %.   General: No apparent distress alert and oriented x3 mood and affect normal, dressed appropriately.  HEENT: Pupils equal, extraocular movements intact  Respiratory: Patient's speak in full sentences and does not appear short of breath  Cardiovascular: No lower extremity edema, non tender, no erythema  Skin: Warm dry intact with no signs of infection or rash on extremities or on axial skeleton.  Abdomen: Soft nontender  Neuro: Cranial nerves II through XII are intact, neurovascularly intact in all extremities with 2+ DTRs and 2+ pulses.  Lymph: No lymphadenopathy of posterior or anterior cervical chain or  axillae bilaterally.  Gait normal with good balance and coordination.  MSK:  Non tender with full range of motion and good stability and symmetric strength and tone of shoulders, elbows, wrist, hip, knee and ankles bilaterally.  Neck: Inspection loss of lordosis. No palpable stepoffs. Negative Spurling's maneuver. Full neck range of motion Grip strength and sensation normal in bilateral hands Strength good C4 to T1 distribution No sensory change to C4 to T1 Negative Hoffman sign bilaterally Reflexes normal Very mild increase in kyphosis of the upper thoracic spine.  Osteopathic findings C3 flexed rotated and side bent left C6 flexed rotated and side bent right  T3 extended rotated and side bent left inhaled third rib T9 extended rotated and side bent left L1 flexed rotated and side bent right  Sacrum right on right    Impression and Recommendations:     This case required medical decision making of moderate complexity. The above documentation has been reviewed and is accurate and complete [name], DO       Note: This dictation was prepared with Dragon dictation along with smaller phrase technology. Any transcriptional errors that result from this process are unintentional.

## 2019-11-08 NOTE — Patient Instructions (Signed)
Good to see you Find time for yourself Exercises are key See me again in 6 weeks

## 2019-11-08 NOTE — Assessment & Plan Note (Signed)
Chronic neck pain.  Patient is doing relatively well.  Do feel that the underlying anxiety is also contributing to some of the aches and pains.  Still responding well to osteopathic manipulation.  Continue same vitamin supplementations no change in medications.  Follow-up again in 6 weeks

## 2019-11-08 NOTE — Assessment & Plan Note (Signed)
Decision today to treat with OMT was based on Physical Exam  After verbal consent patient was treated with HVLA, ME, FPR techniques in cervical, thoracic, rib, lumbar and sacral areas  Patient tolerated the procedure well with improvement in symptoms  Patient given exercises, stretches and lifestyle modifications  See medications in patient instructions if given  Patient will follow up in 6-8 weeks 

## 2019-12-02 MED FILL — ESTRING 2 MG VAGINAL RING: 2 | 90 days supply | Qty: 1 | Fill #1

## 2019-12-14 MED FILL — ESTRADIOL 0.1 MG/GM CRM: 0.1 | 47 days supply | Qty: 43 | Fill #0

## 2019-12-20 ENCOUNTER — Ambulatory Visit: Payer: 59 | Admitting: Family Medicine

## 2019-12-20 DIAGNOSIS — K219 Gastro-esophageal reflux disease without esophagitis: Secondary | ICD-10-CM | POA: Diagnosis not present

## 2019-12-20 DIAGNOSIS — E039 Hypothyroidism, unspecified: Secondary | ICD-10-CM | POA: Diagnosis not present

## 2019-12-29 ENCOUNTER — Other Ambulatory Visit: Payer: Self-pay

## 2019-12-29 ENCOUNTER — Ambulatory Visit: Payer: 59 | Admitting: Family Medicine

## 2019-12-29 ENCOUNTER — Encounter: Payer: Self-pay | Admitting: Family Medicine

## 2019-12-29 VITALS — BP 112/72 | HR 63 | Ht 65.0 in | Wt 163.0 lb

## 2019-12-29 DIAGNOSIS — M542 Cervicalgia: Secondary | ICD-10-CM | POA: Diagnosis not present

## 2019-12-29 DIAGNOSIS — G8929 Other chronic pain: Secondary | ICD-10-CM

## 2019-12-29 DIAGNOSIS — M999 Biomechanical lesion, unspecified: Secondary | ICD-10-CM | POA: Diagnosis not present

## 2019-12-29 NOTE — Assessment & Plan Note (Signed)
Decision today to treat with OMT was based on Physical Exam  After verbal consent patient was treated with HVLA, ME, FPR techniques in cervical, thoracic, rib,  lumbar and sacral areas  Patient tolerated the procedure well with improvement in symptoms  Patient given exercises, stretches and lifestyle modifications  See medications in patient instructions if given  Patient will follow up in 4-8 weeks 

## 2019-12-29 NOTE — Patient Instructions (Signed)
See me again in 6 weeks 

## 2019-12-29 NOTE — Assessment & Plan Note (Signed)
Chronic problem, stable, discussed medical management.  Social determinants of health is difficult secondary to patient working in healthcare during the pandemic and not having time to exercise on a regular basis.  Is responding well to osteopathic manipulation.  No change in medications at this time.  Follow-up again in 4 to 8 weeks

## 2019-12-29 NOTE — Progress Notes (Signed)
[place] Phone: [phone] Subjective:   Shelly [name], am serving as a scribe for Dr. Hulan Saas. This visit occurred during the SARS-CoV-2 public health emergency.  Safety protocols were in place, including screening questions prior to the visit, additional usage of staff PPE, and extensive cleaning of exam room while observing appropriate contact time as indicated for disinfecting solutions.   I'm seeing this patient by the request  of:  Cirigliano, Garvin Fila, DO  CC: neck pain follow up   [id number]  Shelly [name] is a 59 y.o. female coming in with complaint of neck pain. Last seen on 11/08/2019 for OMT. Patient states overall doing well. HEP doing well, [name] problem with  Medications.  Patient is doing relatively well overall.  Mild discomfort and pain nothing that stopping her from activity though.     Past Medical History:  Diagnosis Date  . Chronic kidney disease    kidney stones  . Depression   . Endometriosis   . Fibroid   . Fracture, tibial plateau   . GERD (gastroesophageal reflux disease)   . Heart murmur   . Hypothyroid   . Inflammatory arthritis 08/28/2016  . Insomnia 08/28/2016  . Migraine   . Osteoarthritis of both feet 08/28/2016  . Osteoarthritis of both hands 08/28/2016  . Osteoarthritis of both knees 08/28/2016  . 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 Sicca ([place]) 08/28/2016  . Vitamin D deficiency 08/28/2016   Past Surgical History:  Procedure Laterality Date  . LAPAROSCOPY  1994   endometriosis  . PELVIC LAPAROSCOPY  1993   d/t endometriosis--Dr. Matthew Saras   Social History   Socioeconomic History  . Marital status: Married    Spouse name: Not on file  . Number of children: 2  . Years of education: Not on file  . Highest education level: Not on file  Occupational History  . Occupation: Programmer, multimedia: [employer]  Tobacco Use  . Smoking status: Never Smoker  . Smokeless  tobacco: Never Used  Substance and Sexual Activity  . Alcohol use: [name]  . Drug use: [name]  . Sexual activity: Yes    Partners: Male    Birth control/protection: None  Other Topics Concern  . Not on file  Social History Narrative  . Not on file   Social Determinants of Health   Financial Resource Strain:   . Difficulty of Paying Living Expenses: Not on file  Food Insecurity:   . Worried About Charity fundraiser in the Last Year: Not on file  . Ran Out of Food in the Last Year: Not on file  Transportation Needs:   . Lack of Transportation (Medical): Not on file  . Lack of Transportation (Non-Medical): Not on file  Physical Activity:   . Days of Exercise per Week: Not on file  . Minutes of Exercise per Session: Not on file  Stress:   . Feeling of Stress : Not on file  Social Connections:   . Frequency of Communication with Friends and Family: Not on file  . Frequency of Social Gatherings with Friends and Family: Not on file  . Attends Religious Services: Not on file  . Active Member of Clubs or Organizations: Not on file  . Attends Archivist Meetings: Not on file  . Marital Status: Not on file   Allergies  Allergen Reactions  . Compazine [Prochlorperazine Edisylate] Other (See Comments)    Muscle  contractions   Family History  Problem Relation Age of Onset  . Hypertension Mother   . Dementia Mother   . Hypertension Father   . Stroke Father   . Heart disease Father   . Hypertension Brother   . Hypertension Maternal Grandmother   . Hypertension Maternal Grandfather   . Kidney failure Maternal Grandfather   . Aneurysm Maternal Grandfather   . Cancer Maternal Aunt 42       breast  . Aneurysm Maternal Aunt   . Cancer Cousin        breast    Current Outpatient Medications (Endocrine & Metabolic):  .  levothyroxine (SYNTHROID) 75 MCG tablet, Take 1 tablet (75 mcg total) by mouth daily. .  predniSONE (DELTASONE) 50 MG tablet, 1 tablet by mouth  daily      Current Outpatient Medications (Other):  .  acyclovir (ZOVIRAX) 400 MG tablet, Take 1 tablet (400 mg total) by mouth 3 (three) times daily. .  baclofen (LIORESAL) 10 MG tablet, TAKE 1 TABLET BY MOUTH 3 TIMES DAILY AS NEEDED FOR MUSCLE PAIN .  cholecalciferol (VITAMIN D) 1000 units tablet, Take 2,000 Units by mouth daily.  Marland Kitchen  estradiol (ESTRING) 2 MG vaginal ring, INSERT 1 RING VAGINALLY EVERY 3 MONTHS .  hydrOXYzine (ATARAX/VISTARIL) 10 MG tablet, Take 1 tablet (10 mg total) by mouth 3 (three) times daily as needed. Javier Docker Oil 500 MG CAPS, Take 750 mg by mouth. Marland Kitchen  tiZANidine (ZANAFLEX) 4 MG capsule, 1 tablet at night   Reviewed prior external information including notes and imaging from  primary care provider As well as notes that were available from care everywhere and other healthcare systems.  Past medical history, social, surgical and family history all reviewed in electronic medical record.  [name] pertanent information unless stated regarding to the chief complaint.   Review of Systems:  [name] headache, visual changes, nausea, vomiting, diarrhea, constipation, dizziness, abdominal pain, skin rash, fevers, chills, night sweats, weight loss, swollen lymph nodes, body aches, joint swelling, chest pain, shortness of breath, mood changes. POSITIVE muscle aches  Objective  Blood pressure 112/72, pulse 63, height 5\' 5"  (1.651 m), weight 163 lb (73.9 kg), last menstrual period 02/22/2014, SpO2 99 %.   General: [name] apparent distress alert and oriented x3 mood and affect normal, dressed appropriately.  HEENT: Pupils equal, extraocular movements intact  Respiratory: Patient's speak in full sentences and does not appear short of breath  Cardiovascular: [name] lower extremity edema, non tender, [name] erythema  Skin: Warm dry intact with [name] signs of infection or rash on extremities or on axial skeleton.  Abdomen: Soft nontender  Neuro: Cranial nerves II through XII are intact, neurovascularly  intact in all extremities with 2+ DTRs and 2+ pulses.  Lymph: [name] lymphadenopathy of posterior or anterior cervical chain or axillae bilaterally.  Gait normal with good balance and coordination.  MSK:  Non tender with full range of motion and good stability and symmetric strength and tone of shoulders, elbows, wrist, hip, knee and ankles bilaterally.  Neck exam does have some loss of lordosis.  Tender to palpation in the paraspinal musculature lumbar spine left greater than right which is different than patient's usual side.  Negative Spurling's.  Osteopathic findings C2 flexed rotated and side bent right C6 flexed rotated and side bent left T3 extended rotated and side bent left inhaled third rib T5 extended rotated and side bent right L2 flexed rotated and side bent right Sacrum right on right  Impression and Recommendations:     This case required medical decision making of moderate complexity. The above documentation has been reviewed and is accurate and complete [name], DO       Note: This dictation was prepared with Dragon dictation along with smaller phrase technology. Any transcriptional errors that result from this process are unintentional.

## 2020-01-26 MED FILL — ESTRADIOL 0.1 MG/GM CRM: 0.1 | 47 days supply | Qty: 43 | Fill #1

## 2020-02-09 ENCOUNTER — Encounter: Payer: Self-pay | Admitting: Family Medicine

## 2020-02-09 ENCOUNTER — Other Ambulatory Visit: Payer: Self-pay

## 2020-02-09 ENCOUNTER — Ambulatory Visit: Payer: 59 | Admitting: Family Medicine

## 2020-02-09 VITALS — BP 118/86 | HR 73 | Ht 65.0 in | Wt 160.0 lb

## 2020-02-09 DIAGNOSIS — G2589 Other specified extrapyramidal and movement disorders: Secondary | ICD-10-CM

## 2020-02-09 DIAGNOSIS — M999 Biomechanical lesion, unspecified: Secondary | ICD-10-CM | POA: Diagnosis not present

## 2020-02-09 MED ORDER — ACYCLOVIR 400 MG PO TABS: 400.0000 mg | ORAL_TABLET | Freq: Three times a day (TID) | ORAL | 0 refills | Status: DC

## 2020-02-09 MED FILL — ACYCLOVIR 400 MG TABLET: 400 | 5 days supply | Qty: 15 | Fill #0

## 2020-02-09 NOTE — Patient Instructions (Signed)
See me again in 6 weeks 2 IBU 2x a day for 2 days Muscle relaxer at night for 3 nights

## 2020-02-09 NOTE — Progress Notes (Signed)
[place] Phone: [phone] Subjective:   Shelly [name], am serving as a scribe for Dr. Hulan Saas. This visit occurred during the SARS-CoV-2 public health emergency.  Safety protocols were in place, including screening questions prior to the visit, additional usage of staff PPE, and extensive cleaning of exam room while observing appropriate contact time as indicated for disinfecting solutions.   I'm seeing this patient by the request  of:  Cirigliano, Garvin Fila, DO  CC: Low back pain neck pain follow-up  [id number]  Shelly [name] is a 59 y.o. female coming in with complaint of back pain. Last seen on 12/29/2019 for OMT. Patient states that the right side of her lower back is bothering her with flexion and rotation to the right. Slipped and twisted her back.  Patient states that is more lower back than usual.  Patient has been doing a little bit more activity around the house.  Denies any numbness or tingling.  More uncomfortable at night as well       Past Medical History:  Diagnosis Date  . Chronic kidney disease    kidney stones  . Depression   . Endometriosis   . Fibroid   . Fracture, tibial plateau   . GERD (gastroesophageal reflux disease)   . Heart murmur   . Hypothyroid   . Inflammatory arthritis 08/28/2016  . Insomnia 08/28/2016  . Migraine   . Osteoarthritis of both feet 08/28/2016  . Osteoarthritis of both hands 08/28/2016  . Osteoarthritis of both knees 08/28/2016  . 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 Sicca ([place]) 08/28/2016  . Vitamin D deficiency 08/28/2016   Past Surgical History:  Procedure Laterality Date  . LAPAROSCOPY  1994   endometriosis  . PELVIC LAPAROSCOPY  1993   d/t endometriosis--Dr. Matthew Saras   Social History   Socioeconomic History  . Marital status: Married    Spouse name: Not on file  . Number of children: 2  . Years of education: Not on file  . Highest education  level: Not on file  Occupational History  . Occupation: Programmer, multimedia: [employer]  Tobacco Use  . Smoking status: Never Smoker  . Smokeless tobacco: Never Used  Substance and Sexual Activity  . Alcohol use: [name]  . Drug use: [name]  . Sexual activity: Yes    Partners: Male    Birth control/protection: None  Other Topics Concern  . Not on file  Social History Narrative  . Not on file   Social Determinants of Health   Financial Resource Strain:   . Difficulty of Paying Living Expenses:   Food Insecurity:   . Worried About Charity fundraiser in the Last Year:   . Arboriculturist in the Last Year:   Transportation Needs:   . Film/video editor (Medical):   Marland Kitchen Lack of Transportation (Non-Medical):   Physical Activity:   . Days of Exercise per Week:   . Minutes of Exercise per Session:   Stress:   . Feeling of Stress :   Social Connections:   . Frequency of Communication with Friends and Family:   . Frequency of Social Gatherings with Friends and Family:   . Attends Religious Services:   . Active Member of Clubs or Organizations:   . Attends Archivist Meetings:   Marland Kitchen Marital Status:    Allergies  Allergen Reactions  . Compazine [Prochlorperazine Edisylate] Other (See Comments)  Muscle contractions   Family History  Problem Relation Age of Onset  . Hypertension Mother   . Dementia Mother   . Hypertension Father   . Stroke Father   . Heart disease Father   . Hypertension Brother   . Hypertension Maternal Grandmother   . Hypertension Maternal Grandfather   . Kidney failure Maternal Grandfather   . Aneurysm Maternal Grandfather   . Cancer Maternal Aunt 42       breast  . Aneurysm Maternal Aunt   . Cancer Cousin        breast    Current Outpatient Medications (Endocrine & Metabolic):  .  levothyroxine (SYNTHROID) 75 MCG tablet, Take 1 tablet (75 mcg total) by mouth daily. .  predniSONE (DELTASONE) 50 MG tablet, 1 tablet by mouth  daily      Current Outpatient Medications (Other):  .  baclofen (LIORESAL) 10 MG tablet, TAKE 1 TABLET BY MOUTH 3 TIMES DAILY AS NEEDED FOR MUSCLE PAIN .  cholecalciferol (VITAMIN D) 1000 units tablet, Take 2,000 Units by mouth daily.  Marland Kitchen  estradiol (ESTRING) 2 MG vaginal ring, INSERT 1 RING VAGINALLY EVERY 3 MONTHS .  hydrOXYzine (ATARAX/VISTARIL) 10 MG tablet, Take 1 tablet (10 mg total) by mouth 3 (three) times daily as needed. Javier Docker Oil 500 MG CAPS, Take 750 mg by mouth. Marland Kitchen  tiZANidine (ZANAFLEX) 4 MG capsule, 1 tablet at night .  acyclovir (ZOVIRAX) 400 MG tablet, Take 1 tablet (400 mg total) by mouth 3 (three) times daily.   Reviewed prior external information including notes and imaging from  primary care provider As well as notes that were available from care everywhere and other healthcare systems.  Past medical history, social, surgical and family history all reviewed in electronic medical record.  [name] pertanent information unless stated regarding to the chief complaint.   Review of Systems:  [name] headache, visual changes, nausea, vomiting, diarrhea, constipation, dizziness, abdominal pain, skin rash, fevers, chills, night sweats, weight loss, swollen lymph nodes, body aches, joint swelling, chest pain, shortness of breath, mood changes. POSITIVE muscle aches  Objective  Blood pressure 118/86, pulse 73, height 5\' 5"  (1.651 m), weight 160 lb (72.6 kg), last menstrual period 02/22/2014, SpO2 98 %.   General: [name] apparent distress alert and oriented x3 mood and affect normal, dressed appropriately.  HEENT: Pupils equal, extraocular movements intact  Respiratory: Patient's speak in full sentences and does not appear short of breath  Cardiovascular: [name] lower extremity edema, non tender, [name] erythema  Neuro: Cranial nerves II through XII are intact, neurovascularly intact in all extremities with 2+ DTRs and 2+ pulses.  Gait normal with good balance and coordination.  MSK:  Non  tender with full range of motion and good stability and symmetric strength and tone of shoulders, elbows, wrist, hip, knee and ankles bilaterally.  Low back exam does have some tenderness to palpation of the paraspinal musculature lumbar spine right greater than left.  Patient does have some mild tightness with FABER test.  Negative straight leg test.  More pain in the thoracolumbar juncture also on the right than the left than usual.  Osteopathic findings  C6 flexed rotated and side bent left T3 extended rotated and side bent right inhaled third rib T11 extended rotated and side bent right L2 flexed rotated and side bent right Sacrum right on right    Impression and Recommendations:     This case required medical decision making of moderate complexity. The above documentation has been  reviewed and is accurate and complete [name], DO       Note: This dictation was prepared with Dragon dictation along with smaller phrase technology. Any transcriptional errors that result from this process are unintentional.

## 2020-02-09 NOTE — Assessment & Plan Note (Signed)
Chronic problem with mild exacerbation.  Discussed the medication management including the baclofen in more detail.  Has had prednisone in the past.  We will try not to use it on a regular basis at the moment.  Discussed icing regimen, home exercises, which activities to do which wants to avoid.  Follow-up again in 4 to 8 weeks

## 2020-02-09 NOTE — Assessment & Plan Note (Signed)

## 2020-02-22 ENCOUNTER — Other Ambulatory Visit: Payer: Self-pay | Admitting: Internal Medicine

## 2020-02-22 DIAGNOSIS — Z1231 Encounter for screening mammogram for malignant neoplasm of breast: Secondary | ICD-10-CM

## 2020-03-08 ENCOUNTER — Ambulatory Visit
Admission: RE | Admit: 2020-03-08 | Discharge: 2020-03-08 | Disposition: A | Discharge status: Home / Self Care | Payer: 59 | Source: Ambulatory Visit

## 2020-03-08 ENCOUNTER — Other Ambulatory Visit: Payer: Self-pay

## 2020-03-08 DIAGNOSIS — Z1231 Encounter for screening mammogram for malignant neoplasm of breast: Secondary | ICD-10-CM | POA: Diagnosis not present

## 2020-03-22 ENCOUNTER — Encounter: Payer: Self-pay | Admitting: Family Medicine

## 2020-03-22 ENCOUNTER — Ambulatory Visit: Payer: 59 | Admitting: Family Medicine

## 2020-03-22 VITALS — BP 124/80 | HR 69 | Ht 65.0 in | Wt 157.0 lb

## 2020-03-22 DIAGNOSIS — M999 Biomechanical lesion, unspecified: Secondary | ICD-10-CM

## 2020-03-22 DIAGNOSIS — G8929 Other chronic pain: Secondary | ICD-10-CM | POA: Diagnosis not present

## 2020-03-22 DIAGNOSIS — M542 Cervicalgia: Secondary | ICD-10-CM | POA: Diagnosis not present

## 2020-03-22 NOTE — Progress Notes (Signed)
[place] [address] [place] Phone: [phone] Subjective:   I Shelly [name] am serving as a Education administrator for Dr. Hulan Saas.  This visit occurred during the SARS-CoV-2 public health emergency.  Safety protocols were in place, including screening questions prior to the visit, additional usage of staff PPE, and extensive cleaning of exam room while observing appropriate contact time as indicated for disinfecting solutions.   I'm seeing this patient by the request  of:  Cirigliano, Garvin Fila, DO  CC: Neck pain and back pain follow-up  [id number]  Shelly [name] is a 59 y.o. female coming in with complaint of back and neck pain. Last seen on 02/09/2020 for OMT. Patient states she is doing well.  Patient has been doing a lot of hard labor at the house.  He is building a Human resources officer doing a significant amount of it herself.  Patient does find some more tightness in the lower back.  Some mild tightness in the neck but no radicular symptoms.     Past Medical History:  Diagnosis Date  . Chronic kidney disease    kidney stones  . Depression   . Endometriosis   . Fibroid   . Fracture, tibial plateau   . GERD (gastroesophageal reflux disease)   . Heart murmur   . Hypothyroid   . Inflammatory arthritis 08/28/2016  . Insomnia 08/28/2016  . Migraine   . Osteoarthritis of both feet 08/28/2016  . Osteoarthritis of both hands 08/28/2016  . Osteoarthritis of both knees 08/28/2016  . 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 Sicca ([place]) 08/28/2016  . Vitamin D deficiency 08/28/2016   Past Surgical History:  Procedure Laterality Date  . LAPAROSCOPY  1994   endometriosis  . PELVIC LAPAROSCOPY  1993   d/t endometriosis--Dr. Matthew Saras   Social History   Socioeconomic History  . Marital status: Married    Spouse name: Not on file  . Number of children: 2  . Years of education: Not on file  . Highest education level: Not on file  Occupational History    . Occupation: Programmer, multimedia: [employer]  Tobacco Use  . Smoking status: Never Smoker  . Smokeless tobacco: Never Used  Substance and Sexual Activity  . Alcohol use: No  . Drug use: No  . Sexual activity: Yes    Partners: Male    Birth control/protection: None  Other Topics Concern  . Not on file  Social History Narrative  . Not on file   Social Determinants of Health   Financial Resource Strain:   . Difficulty of Paying Living Expenses:   Food Insecurity:   . Worried About Charity fundraiser in the Last Year:   . Arboriculturist in the Last Year:   Transportation Needs:   . Film/video editor (Medical):   Marland Kitchen Lack of Transportation (Non-Medical):   Physical Activity:   . Days of Exercise per Week:   . Minutes of Exercise per Session:   Stress:   . Feeling of Stress :   Social Connections:   . Frequency of Communication with Friends and Family:   . Frequency of Social Gatherings with Friends and Family:   . Attends Religious Services:   . Active Member of Clubs or Organizations:   . Attends Archivist Meetings:   Marland Kitchen Marital Status:    Allergies  Allergen Reactions  . Compazine [Prochlorperazine Edisylate] Other (See Comments)    Muscle  contractions   Family History  Problem Relation Age of Onset  . Hypertension Mother   . Dementia Mother   . Hypertension Father   . Stroke Father   . Heart disease Father   . Hypertension Brother   . Hypertension Maternal Grandmother   . Hypertension Maternal Grandfather   . Kidney failure Maternal Grandfather   . Aneurysm Maternal Grandfather   . Cancer Maternal Aunt 42       breast  . Aneurysm Maternal Aunt   . Breast cancer Maternal Aunt   . Cancer Cousin        breast  . Breast cancer Cousin     Current Outpatient Medications (Endocrine & Metabolic):  .  levothyroxine (SYNTHROID) 75 MCG tablet, Take 1 tablet (75 mcg total) by mouth daily.      Current Outpatient Medications (Other):  .   acyclovir (ZOVIRAX) 400 MG tablet, Take 1 tablet (400 mg total) by mouth 3 (three) times daily. .  baclofen (LIORESAL) 10 MG tablet, TAKE 1 TABLET BY MOUTH 3 TIMES DAILY AS NEEDED FOR MUSCLE PAIN .  cholecalciferol (VITAMIN D) 1000 units tablet, Take 2,000 Units by mouth daily.  Marland Kitchen  estradiol (ESTRING) 2 MG vaginal ring, INSERT 1 RING VAGINALLY EVERY 3 MONTHS .  hydrOXYzine (ATARAX/VISTARIL) 10 MG tablet, Take 1 tablet (10 mg total) by mouth 3 (three) times daily as needed. Javier Docker Oil 500 MG CAPS, Take 750 mg by mouth. Marland Kitchen  tiZANidine (ZANAFLEX) 4 MG capsule, 1 tablet at night   Reviewed prior external information including notes and imaging from  primary care provider As well as notes that were available from care everywhere and other healthcare systems.  Past medical history, social, surgical and family history all reviewed in electronic medical record.  No pertanent information unless stated regarding to the chief complaint.   Review of Systems:  No headache, visual changes, nausea, vomiting, diarrhea, constipation, dizziness, abdominal pain, skin rash, fevers, chills, night sweats, weight loss, swollen lymph nodes, body aches, joint swelling, chest pain, shortness of breath, mood changes. POSITIVE muscle aches  Objective  Blood pressure 124/80, pulse 69, height 5\' 5"  (1.651 m), weight 157 lb (71.2 kg), last menstrual period 02/22/2014, SpO2 97 %.   General: No apparent distress alert and oriented x3 mood and affect normal, dressed appropriately.  HEENT: Pupils equal, extraocular movements intact  Respiratory: Patient's speak in full sentences and does not appear short of breath  Cardiovascular: No lower extremity edema, non tender, no erythema  Neuro: Cranial nerves II through XII are intact, neurovascularly intact in all extremities with 2+ DTRs and 2+ pulses.  Gait normal with good balance and coordination.  MSK:  Non tender with full range of motion and good stability and symmetric  strength and tone of shoulders, elbows, wrist, hip, knee and ankles bilaterally.  Neck exam does have some mild loss of lordosis.  Tender to palpation of the paraspinal musculature right greater than left.  Negative straight leg test.  Lower back some tightness noted in the right sacroiliac joint.  Neurovascular intact in all extremities with 5 out of 5 strength  Osteopathic findings  C6 flexed rotated and side bent left T3 extended rotated and side bent right inhaled third rib T9 extended rotated and side bent left L5 flexed rotated and side bent left Sacrum right on right    Impression and Recommendations:     This case required medical decision making of moderate complexity. The above documentation has been reviewed  and is accurate and complete [name], DO       Note: This dictation was prepared with Dragon dictation along with smaller phrase technology. Any transcriptional errors that result from this process are unintentional.

## 2020-03-22 NOTE — Assessment & Plan Note (Signed)

## 2020-03-22 NOTE — Assessment & Plan Note (Signed)
Chronic problem with mild exacerbation.  Patient is on hydroxyzine for breakthrough anxiety that could be contributing.  Baclofen can help with more of the muscle relaxation patient has responded fairly well to osteopathic manipulation.  Hopefully this will continue.  Follow-up again in 6 weeks

## 2020-03-22 NOTE — Patient Instructions (Signed)
Good luck with the project See me in 6 weeks

## 2020-04-06 DIAGNOSIS — E039 Hypothyroidism, unspecified: Secondary | ICD-10-CM | POA: Diagnosis not present

## 2020-04-06 DIAGNOSIS — R7301 Impaired fasting glucose: Secondary | ICD-10-CM | POA: Diagnosis not present

## 2020-04-06 DIAGNOSIS — K219 Gastro-esophageal reflux disease without esophagitis: Secondary | ICD-10-CM | POA: Diagnosis not present

## 2020-04-06 DIAGNOSIS — E559 Vitamin D deficiency, unspecified: Secondary | ICD-10-CM | POA: Diagnosis not present

## 2020-05-08 ENCOUNTER — Ambulatory Visit: Payer: 59 | Admitting: Family Medicine

## 2020-06-15 ENCOUNTER — Ambulatory Visit: Payer: 59 | Admitting: Family Medicine

## 2020-06-15 ENCOUNTER — Other Ambulatory Visit: Payer: Self-pay

## 2020-06-15 ENCOUNTER — Encounter: Payer: Self-pay | Admitting: Family Medicine

## 2020-06-15 VITALS — BP 126/88 | HR 81 | Ht 65.0 in | Wt 158.0 lb

## 2020-06-15 DIAGNOSIS — G2589 Other specified extrapyramidal and movement disorders: Secondary | ICD-10-CM

## 2020-06-15 DIAGNOSIS — M999 Biomechanical lesion, unspecified: Secondary | ICD-10-CM

## 2020-06-15 NOTE — Assessment & Plan Note (Signed)
Scapular dyskinesis, worsening symptoms today at this moment.  Chronic problem with exacerbation.  Discussed hydroxyzine and the Zanaflex.  Does have some underlying stress with patient's ailing mother that is contributing.  Follow-up with me again in 2 to 3 months

## 2020-06-15 NOTE — Progress Notes (Signed)
[place] Phone: [phone] Subjective:   [name], am serving as a scribe for Dr. Hulan Saas. This visit occurred during the SARS-CoV-2 public health emergency.  Safety protocols were in place, including screening questions prior to the visit, additional usage of staff PPE, and extensive cleaning of exam room while observing appropriate contact time as indicated for disinfecting solutions.   I'm seeing this patient by the request  of:  Cirigliano, Garvin Fila, DO  CC: Neck and back pain follow-up  [id number]  WYLMA TATEM is a 59 y.o. female coming in with complaint of back and neck pain Patient states that she has been doing well since last visit.  Patient continues to have discomfort and pain on a regular basis.  Patient has had more aching recently.  Did have to send her mother to the emergency room this morning.  May be some mild increase in stress           Reviewed prior external information including notes and imaging from previsou exam, outside providers and external EMR if available.   As well as notes that were available from care everywhere and other healthcare systems.  Past medical history, social, surgical and family history all reviewed in electronic medical record.  No pertanent information unless stated regarding to the chief complaint.   Past Medical History:  Diagnosis Date  . Chronic kidney disease    kidney stones  . Depression   . Endometriosis   . Fibroid   . Fracture, tibial plateau   . GERD (gastroesophageal reflux disease)   . Heart murmur   . Hypothyroid   . Inflammatory arthritis 08/28/2016  . Insomnia 08/28/2016  . Migraine   . Osteoarthritis of both feet 08/28/2016  . Osteoarthritis of both hands 08/28/2016  . Osteoarthritis of both knees 08/28/2016  . 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 Sicca ([place]) 08/28/2016  . Vitamin D deficiency 08/28/2016    Allergies    Allergen Reactions  . Compazine [Prochlorperazine Edisylate] Other (See Comments)    Muscle contractions     Review of Systems:  No headache, visual changes, nausea, vomiting, diarrhea, constipation, dizziness, abdominal pain, skin rash, fevers, chills, night sweats, weight loss, swollen lymph nodes, body aches, joint swelling, chest pain, shortness of breath, mood changes. POSITIVE muscle aches  Objective  Last menstrual period 02/22/2014.   General: No apparent distress alert and oriented x3 mood and affect normal, dressed appropriately.  HEENT: Pupils equal, extraocular movements intact  Respiratory: Patient's speak in full sentences and does not appear short of breath  Cardiovascular: No lower extremity edema, non tender, no erythema  Neuro: Cranial nerves II through XII are intact, neurovascularly intact in all extremities with 2+ DTRs and 2+ pulses.  Gait normal with good balance and coordination.  MSK:  Non tender with full range of motion and good stability and symmetric strength and tone of shoulders, elbows, wrist, hip, knee and ankles bilaterally.  Back -scapular dyskinesis noted on the right side.  Patient has some tenderness in the low back as well bilateral sacroiliac joints.  Negative straight leg test.  5 out of 5 strength in lower extremities.  Osteopathic findings  C2 flexed rotated and side bent right C6 flexed rotated and side bent left T3 extended rotated and side bent right inhaled rib T9 extended rotated and side bent left L5 flexed rotated and side bent left Sacrum right on right  Assessment and Plan:  Scapular dyskinesis Scapular dyskinesis, worsening symptoms today at this moment.  Chronic problem with exacerbation.  Discussed hydroxyzine and the Zanaflex.  Does have some underlying stress with patient's ailing mother that is contributing.  Follow-up with me again in 2 to 3 months    Nonallopathic problems  Decision today to treat with OMT  was based on Physical Exam  After verbal consent patient was treated with HVLA, ME, FPR techniques in cervical, rib, thoracic, lumbar, and sacral  areas  Patient tolerated the procedure well with improvement in symptoms  Patient given exercises, stretches and lifestyle modifications  See medications in patient instructions if given  Patient will follow up in 4-8 weeks      The above documentation has been reviewed and is accurate and complete [name], DO       Note: This dictation was prepared with Dragon dictation along with smaller phrase technology. Any transcriptional errors that result from this process are unintentional.

## 2020-07-11 DIAGNOSIS — Z20822 Contact with and (suspected) exposure to covid-19: Secondary | ICD-10-CM | POA: Diagnosis not present

## 2020-07-11 DIAGNOSIS — U071 COVID-19: Secondary | ICD-10-CM | POA: Diagnosis not present

## 2020-07-12 ENCOUNTER — Ambulatory Visit (HOSPITAL_COMMUNITY)
Admission: RE | Admit: 2020-07-12 | Discharge: 2020-07-12 | Disposition: A | Discharge status: Home / Self Care | Payer: 59 | Source: Ambulatory Visit | Attending: Pulmonary Disease | Admitting: Pulmonary Disease

## 2020-07-12 ENCOUNTER — Other Ambulatory Visit: Payer: Self-pay | Admitting: Physician Assistant

## 2020-07-12 DIAGNOSIS — E038 Other specified hypothyroidism: Secondary | ICD-10-CM

## 2020-07-12 DIAGNOSIS — U071 COVID-19: Secondary | ICD-10-CM | POA: Insufficient documentation

## 2020-07-12 DIAGNOSIS — E663 Overweight: Secondary | ICD-10-CM

## 2020-07-12 MED ORDER — SODIUM CHLORIDE 0.9 % IV SOLN
1200.0000 mg | Freq: Once | INTRAVENOUS | Status: AC
  Administered 2020-07-12: 1200 mg via INTRAVENOUS

## 2020-07-12 MED ORDER — EPINEPHRINE 0.3 MG/0.3ML IJ SOAJ: 0.3000 mg | Freq: Once | INTRAMUSCULAR | Status: DC | PRN

## 2020-07-12 MED ORDER — DIPHENHYDRAMINE HCL 50 MG/ML IJ SOLN: 50.0000 mg | Freq: Once | INTRAMUSCULAR | Status: DC | PRN

## 2020-07-12 MED ORDER — ALBUTEROL SULFATE HFA 108 (90 BASE) MCG/ACT IN AERS: 2.0000 | INHALATION_SPRAY | Freq: Once | RESPIRATORY_TRACT | Status: DC | PRN

## 2020-07-12 MED ORDER — FAMOTIDINE IN NACL 20-0.9 MG/50ML-% IV SOLN: 20.0000 mg | Freq: Once | INTRAVENOUS | Status: DC | PRN

## 2020-07-12 MED ORDER — SODIUM CHLORIDE 0.9 % IV SOLN: INTRAVENOUS | Status: DC | PRN

## 2020-07-12 MED ORDER — METHYLPREDNISOLONE SODIUM SUCC 125 MG IJ SOLR: 125.0000 mg | Freq: Once | INTRAMUSCULAR | Status: DC | PRN

## 2020-07-12 NOTE — Progress Notes (Signed)
I connected by phone with Shelly [name] on 07/12/2020 at 9:50 AM to discuss the potential use of a new treatment for mild to moderate COVID-19 viral infection in non-hospitalized patients.  This patient is a 59 y.o. female that meets the FDA criteria for Emergency Use Authorization of COVID monoclonal antibody casirivimab/imdevimab.  Has a (+) direct SARS-CoV-2 viral test result  Has mild or moderate COVID-19   Is NOT hospitalized due to COVID-19  Is within 10 days of symptom onset  Has at least one of the high risk factor(s) for progression to severe COVID-19 and/or hospitalization as defined in EUA.  Specific high risk criteria : BMI > 25   I have spoken and communicated the following to the patient or parent/caregiver regarding COVID monoclonal antibody treatment:  1. FDA has authorized the emergency use for the treatment of mild to moderate COVID-19 in adults and pediatric patients with positive results of direct SARS-CoV-2 viral testing who are 64 years of age and older weighing at least 40 kg, and who are at high risk for progressing to severe COVID-19 and/or hospitalization.  2. The significant known and potential risks and benefits of COVID monoclonal antibody, and the extent to which such potential risks and benefits are unknown.  3. Information on available alternative treatments and the risks and benefits of those alternatives, including clinical trials.  4. Patients treated with COVID monoclonal antibody should continue to self-isolate and use infection control measures (e.g., wear mask, isolate, social distance, avoid sharing personal items, clean and disinfect "high touch" surfaces, and frequent handwashing) according to CDC guidelines.   5. The patient or parent/caregiver has the option to accept or refuse COVID monoclonal antibody treatment.  After reviewing this information with the patient, The patient agreed to proceed with receiving casirivimab\imdevimab infusion and  will be provided a copy of the Fact sheet prior to receiving the infusion.   Shelly [name] 07/12/2020 9:50 AM

## 2020-07-12 NOTE — Progress Notes (Signed)
  Diagnosis: COVID-19  Physician:Dr WRight  Procedure: [facility] Med: casirivimab\imdevimab infusion - Provided patient with casirivimab\imdevimab fact sheet for patients, parents and caregivers prior to infusion.  Complications: No immediate complications noted.  Discharge: Discharged home   [place], [place] 07/12/2020

## 2020-07-12 NOTE — Discharge Instructions (Signed)

## 2020-07-13 DIAGNOSIS — U071 COVID-19: Secondary | ICD-10-CM | POA: Diagnosis not present

## 2020-07-24 DIAGNOSIS — B948 Sequelae of other specified infectious and parasitic diseases: Secondary | ICD-10-CM | POA: Diagnosis not present

## 2020-07-24 DIAGNOSIS — R0602 Shortness of breath: Secondary | ICD-10-CM | POA: Diagnosis not present

## 2020-08-03 ENCOUNTER — Ambulatory Visit: Payer: 59 | Admitting: Family Medicine

## 2020-08-17 NOTE — Progress Notes (Signed)
Office Visit Note  Patient: Shelly [name]             Date of Birth: 03-20-1961           MRN: [id number]             PCP: Ronnald Nian, DO Referring: Ronnald Nian, DO Visit Date: 08/31/2020 Occupation: @GUAROCC @  Subjective:  Yearly follow up   History of Present Illness: Shelly [name] is a 59 y.o. female with history of osteoarthritis and sicca symptoms.  Patient was last seen on 09/01/2019.  She denies any new or worsening concerns since her last office visit.  She denies any increased joint pain or joint swelling.  She continues to follow along with Dr. Tamala Julian for management of cubital tunnel syndrome of both elbows.  She has undergone manipulations as well as ultrasound in the past which have managed her symptoms.  She experiences occasional discomfort in her lower back which is typically activity related.  She takes baclofen very sparingly for myalgias and muscle spasms.  She would like a refill.  She has been sleeping well at night and her fatigue has been stable. She experiences intermittent eye dryness but denies any mouth dryness.  She has not had any oral or nasal ulcerations.  She denies any facial rashes.  She experiences occasional skin irritation on bilateral forearms which is exacerbated by sun exposure.  She also states that her dogs for seems to be an irritant.  She applies topical hydrocortisone cream as needed.  She denies any symptoms of Raynaud's. She reports that she underwent menopause at the age of 47 but has not had a bone density yet.  She has been taking vitamin D 10,000 units daily.   Activities of Daily Living:  Patient reports morning stiffness for 0  minutes.   Patient Denies nocturnal pain.  Difficulty dressing/grooming: Denies Difficulty climbing stairs: Denies Difficulty getting out of chair: Denies Difficulty using hands for taps, buttons, cutlery, and/or writing: Denies  Review of Systems  Constitutional: Negative for fatigue.  HENT:  Negative for mouth sores, mouth dryness and nose dryness.   Eyes: Positive for dryness. Negative for pain and visual disturbance.  Respiratory: Negative for cough, hemoptysis, shortness of breath and difficulty breathing.   Cardiovascular: Negative for chest pain, palpitations, hypertension and swelling in legs/feet.  Gastrointestinal: Negative for blood in stool, constipation and diarrhea.  Endocrine: Negative for increased urination.  Genitourinary: Negative for painful urination.  Musculoskeletal: Negative for arthralgias, joint pain, joint swelling, myalgias, muscle weakness, morning stiffness, muscle tenderness and myalgias.  Skin: Negative for color change, pallor, rash, hair loss, nodules/bumps, skin tightness, ulcers and sensitivity to sunlight.  Allergic/Immunologic: Negative for susceptible to infections.  Neurological: Positive for numbness. Negative for dizziness, headaches and weakness.  Hematological: Negative for swollen glands.  Psychiatric/Behavioral: Negative for depressed mood and sleep disturbance. The patient is not nervous/anxious.     PMFS History:  Patient Active Problem List   Diagnosis Date Noted  . Anxiety 08/11/2019  . 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 Cubital tunnel syndrome 10/07/2018  . Nonallopathic lesion of rib cage 07/08/2018  . Nonallopathic lesion of sacral region 07/08/2018  . Sicca syndrome with keratoconjunctivitis ([place]) 07/29/2017  . Osteoarthritis of both hands 08/28/2016  . Osteoarthritis of both feet 08/28/2016  . Osteoarthritis of both knees 08/28/2016  . Insomnia 08/28/2016  . 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 Vitamin D deficiency 08/28/2016  . Trapezius muscle spasm 08/28/2016  . Chronic neck pain  03/01/2016  . Scapular dyskinesis 03/01/2016  . Nonallopathic lesion of cervical region 03/01/2016  . Nonallopathic lesion of thoracic region 03/01/2016  . Nonallopathic lesion of lumbosacral region 03/01/2016  . DUB (dysfunctional uterine  bleeding) 01/05/2014  . Family history of abdominal aortic aneurysm 08/12/2012  . Renal calculus, left 06/04/2012  . Irritability 05/01/2012  . GERD (gastroesophageal reflux disease) 05/01/2012  . History of migraine 05/01/2012  . Hypothyroidism 05/01/2012  . History of renal calculi 05/01/2012  . Leukopenia 05/01/2012  . 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 Allergic rhinitis due to allergen 05/01/2012    Past Medical History:  Diagnosis Date  . Chronic kidney disease    kidney stones  . Depression   . Endometriosis   . Fibroid   . Fracture, tibial plateau   . GERD (gastroesophageal reflux disease)   . Heart murmur   . Hypothyroid   . Inflammatory arthritis 08/28/2016  . Insomnia 08/28/2016  . Migraine   . Osteoarthritis of both feet 08/28/2016  . Osteoarthritis of both hands 08/28/2016  . Osteoarthritis of both knees 08/28/2016  . 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 Sicca ([place]) 08/28/2016  . Vitamin D deficiency 08/28/2016    Family History  Problem Relation Age of Onset  . Hypertension Mother   . Dementia Mother   . Hypertension Father   . Stroke Father   . Heart disease Father   . Hypertension Brother   . Hypertension Maternal Grandmother   . Hypertension Maternal Grandfather   . Kidney failure Maternal Grandfather   . Aneurysm Maternal Grandfather   . Cancer Maternal Aunt 42       breast  . Aneurysm Maternal Aunt   . Breast cancer Maternal Aunt   . Cancer Cousin        breast  . Breast cancer Cousin    Past Surgical History:  Procedure Laterality Date  . LAPAROSCOPY  1994   endometriosis  . PELVIC LAPAROSCOPY  1993   d/t endometriosis--Dr. Matthew Saras   Social History   Social History Narrative  . Not on file   Immunization History  Administered Date(s) Administered  . Influenza-Unspecified 07/28/2014, 08/14/2018     Objective: Vital Signs: BP 134/80 (BP Location: Left Arm, Patient Position: Sitting, Cuff Size: Normal)   Pulse 65   Resp 15   Ht 5'  5.75" (1.67 m)   Wt 161 lb (73 kg)   LMP 02/22/2014   BMI 26.18 kg/m    Physical Exam Vitals and nursing note reviewed.  Constitutional:      Appearance: She is well-developed.  HENT:     Head: Normocephalic and atraumatic.  Eyes:     Conjunctiva/sclera: Conjunctivae normal.  Pulmonary:     Effort: Pulmonary effort is normal.  Abdominal:     Palpations: Abdomen is soft.  Musculoskeletal:     Cervical back: Normal range of motion.  Skin:    General: Skin is warm and dry.     Capillary Refill: Capillary refill takes less than 2 seconds.  Neurological:     Mental Status: She is alert and oriented to person, place, and time.  Psychiatric:        Behavior: Behavior normal.      Musculoskeletal Exam: C-spine, thoracic spine, lumbar spine good range of motion with no discomfort.  Shoulder joints, elbow joints, wrist joints, MCPs, PIPs, DIPs have good range of motion with no synovitis.  She is able to make a complete fist bilaterally.  Hip joints have good  range of motion with no discomfort.  Knee joints have good range of motion with no warmth or effusion.  Ankle joints have good range of motion with no tenderness or inflammation.  CDAI Exam: CDAI Score: -- Patient Global: --; Provider Global: -- Swollen: --; Tender: -- Joint Exam 08/31/2020   No joint exam has been documented for this visit   There is currently no information documented on the homunculus. Go to the Rheumatology activity and complete the homunculus joint exam.  Investigation: No additional findings.  Imaging: No results found.  Recent Labs: Lab Results  Component Value Date   WBC 3.2 (L) 05/31/2019   HGB 13.4 05/31/2019   PLT 222.0 05/31/2019   NA 145 05/31/2019   K 5.0 05/31/2019   CL 110 05/31/2019   CO2 26 05/31/2019   GLUCOSE 100 (H) 05/31/2019   BUN 20 05/31/2019   CREATININE 0.54 05/31/2019   BILITOT 0.4 03/04/2017   ALKPHOS 94 03/04/2017   AST 14 05/31/2019   ALT 16 05/31/2019   PROT  7.3 03/04/2017   ALBUMIN 4.5 03/04/2017   CALCIUM 9.4 05/31/2019   GFRAA >89 03/04/2017    Speciality Comments: No specialty comments available.  Procedures:  No procedures performed Allergies: Compazine [prochlorperazine edisylate]   Assessment / Plan:     Visit Diagnoses: Primary osteoarthritis of both hands: She is not experiencing any discomfort, joint tenderness, inflammation, or stiffness in her hands at this time.  She has no clinical features of rheumatoid arthritis at this time.  No synovitis was noted on exam.  She was able to make a complete fist bilaterally.  Joint protection and muscle strengthening were discussed.  She was advised to notify us if she develops increased joint pain or joint swelling.  She requested to follow-up as needed.  Primary osteoarthritis of both knees: She is not experiencing any discomfort or stiffness in her knee joints at this time.  She has good range of motion with no warmth or effusion on exam.  No crepitus noted.  We discussed the importance of regular exercise.  Primary osteoarthritis of both feet: She is not having any discomfort in her feet at this time.  Good range of motion of both ankle joints with no tenderness or inflammation.  She wears proper fitting shoes.  Cubital tunnel syndrome, bilateral - She continues to follow-up with Dr. Tamala Julian on a regular basis (every 6-12 weeks).  She has underwent manipulations and ultrasound which have alleviated her symptoms.   Sicca syndrome with keratoconjunctivitis ([place]): She has intermittent eye dryness.  She has not been using eyedrops or taking fish oil recently.  We discussed the importance of using eyedrops as needed, restarting a fish oil supplement, and the use of a humidifier during the winter months.  Sore in nose - AVISE labs negative.  She has not had any recent oral or nasal ulcerations.  She has no other clinical features of systemic lupus at this time.  Facial flushing - AVISE labs negative.   She has not had any facial rashes recently.  No malar rash was noted on exam today.  Hair thinning - AVISE labs negative.   Other insomnia: She has been sleeping well at night.   Encounter for osteoporosis screening in asymptomatic postmenopausal patient -According to the patient she went through menopause at the age of 46 but has not had a baseline bone density.  Future order for DEXA was placed today.  She has known vitamin D deficiency and has been taking  vitamin D 10,000 units daily.  Plan: DG BONE DENSITY (DXA)  History of vitamin D deficiency: She is taking vitamin D 10,000 units daily.  Other medical conditions are listed as follows:   History of migraine  History of gastroesophageal reflux (GERD)  History of hypothyroidism  History of renal calculi    Orders: Orders Placed This Encounter  Procedures  . DG BONE DENSITY (DXA)   Meds ordered this encounter  Medications  . baclofen (LIORESAL) 10 MG tablet    Sig: TAKE 1 TABLET BY MOUTH 3 TIMES DAILY AS NEEDED FOR MUSCLE PAIN    Dispense:  90 tablet    Refill:  0    Follow-Up Instructions: Return if symptoms worsen or fail to improve.   Ofilia Neas, PA-C  Note - This record has been created using Dragon software.  Chart creation errors have been sought, but may not always  have been located. Such creation errors do not reflect on  the standard of medical care.

## 2020-08-30 MED FILL — ESTRADIOL 0.1 MG/GM CREA: 0.1 | 47 days supply | Qty: 43 | Fill #3

## 2020-08-31 ENCOUNTER — Other Ambulatory Visit: Payer: Self-pay

## 2020-08-31 ENCOUNTER — Ambulatory Visit: Payer: 59 | Admitting: Physician Assistant

## 2020-08-31 ENCOUNTER — Other Ambulatory Visit: Payer: Self-pay | Admitting: Physician Assistant

## 2020-08-31 ENCOUNTER — Encounter: Payer: Self-pay | Admitting: Physician Assistant

## 2020-08-31 VITALS — BP 134/80 | HR 65 | Resp 15 | Ht 65.75 in | Wt 161.0 lb

## 2020-08-31 DIAGNOSIS — Z780 Asymptomatic menopausal state: Secondary | ICD-10-CM

## 2020-08-31 DIAGNOSIS — G4709 Other insomnia: Secondary | ICD-10-CM

## 2020-08-31 DIAGNOSIS — M170 Bilateral primary osteoarthritis of knee: Secondary | ICD-10-CM | POA: Diagnosis not present

## 2020-08-31 DIAGNOSIS — Z8719 Personal history of other diseases of the digestive system: Secondary | ICD-10-CM

## 2020-08-31 DIAGNOSIS — R232 Flushing: Secondary | ICD-10-CM

## 2020-08-31 DIAGNOSIS — Z87442 Personal history of urinary calculi: Secondary | ICD-10-CM

## 2020-08-31 DIAGNOSIS — Z8669 Personal history of other diseases of the nervous system and sense organs: Secondary | ICD-10-CM

## 2020-08-31 DIAGNOSIS — M19041 Primary osteoarthritis, right hand: Secondary | ICD-10-CM

## 2020-08-31 DIAGNOSIS — L659 Nonscarring hair loss, unspecified: Secondary | ICD-10-CM | POA: Diagnosis not present

## 2020-08-31 DIAGNOSIS — M3501 Sicca syndrome with keratoconjunctivitis: Secondary | ICD-10-CM | POA: Diagnosis not present

## 2020-08-31 DIAGNOSIS — M19071 Primary osteoarthritis, right ankle and foot: Secondary | ICD-10-CM | POA: Diagnosis not present

## 2020-08-31 DIAGNOSIS — J3489 Other specified disorders of nose and nasal sinuses: Secondary | ICD-10-CM

## 2020-08-31 DIAGNOSIS — M19072 Primary osteoarthritis, left ankle and foot: Secondary | ICD-10-CM

## 2020-08-31 DIAGNOSIS — G5623 Lesion of ulnar nerve, bilateral upper limbs: Secondary | ICD-10-CM | POA: Diagnosis not present

## 2020-08-31 DIAGNOSIS — M19042 Primary osteoarthritis, left hand: Secondary | ICD-10-CM

## 2020-08-31 DIAGNOSIS — Z8639 Personal history of other endocrine, nutritional and metabolic disease: Secondary | ICD-10-CM

## 2020-08-31 DIAGNOSIS — Z13820 Encounter for screening for osteoporosis: Secondary | ICD-10-CM

## 2020-08-31 MED ORDER — BACLOFEN 10 MG PO TABS
ORAL_TABLET | ORAL | 0 refills | Status: DC
Start: 2020-08-31 — End: 2020-08-31

## 2020-08-31 MED FILL — BACLOFEN 10 MG TABS: 10 | 30 days supply | Qty: 90 | Fill #0

## 2020-09-06 DIAGNOSIS — E559 Vitamin D deficiency, unspecified: Secondary | ICD-10-CM | POA: Diagnosis not present

## 2020-09-06 DIAGNOSIS — E039 Hypothyroidism, unspecified: Secondary | ICD-10-CM | POA: Diagnosis not present

## 2020-09-18 NOTE — Progress Notes (Signed)
[facility] [place] [place] Phone: [phone] Subjective:   [name], am serving as a scribe for Dr. Hulan Saas. This visit occurred during the SARS-CoV-2 public health emergency.  Safety protocols were in place, including screening questions prior to the visit, additional usage of staff PPE, and extensive cleaning of exam room while observing appropriate contact time as indicated for disinfecting solutions.   I'm seeing this patient by the request  of:  Cirigliano, Garvin Fila, DO  CC: neck and back pain follow up   [id number]  Shelly [name] is a 59 y.o. female coming in with complaint of back and neck pain. OMT 06/15/2020. Patient states that she has been doing fine.  Mild tightness, has been 3 months form patient regular interval of 6 weeks. No new symptoms  Medications patient has been prescribed: Atarax  Taking:intermittantly          Reviewed prior external information including notes and imaging from previsou exam, outside providers and external EMR if available.   As well as notes that were available from care everywhere and other healthcare systems.  Past medical history, social, surgical and family history all reviewed in electronic medical record.  No pertanent information unless stated regarding to the chief complaint.   Past Medical History:  Diagnosis Date  . Chronic kidney disease    kidney stones  . Depression   . Endometriosis   . Fibroid   . Fracture, tibial plateau   . GERD (gastroesophageal reflux disease)   . Heart murmur   . Hypothyroid   . Inflammatory arthritis 08/28/2016  . Insomnia 08/28/2016  . Migraine   . Osteoarthritis of both feet 08/28/2016  . Osteoarthritis of both hands 08/28/2016  . Osteoarthritis of both knees 08/28/2016  . 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 Sicca ([place]) 08/28/2016  . Vitamin D deficiency 08/28/2016    Allergies  Allergen Reactions  . Compazine  [Prochlorperazine Edisylate] Other (See Comments)    Muscle contractions     Review of Systems:  No headache, visual changes, nausea, vomiting, diarrhea, constipation, dizziness, abdominal pain, skin rash, fevers, chills, night sweats, weight loss, swollen lymph nodes, body aches, joint swelling, chest pain, shortness of breath, mood changes. POSITIVE muscle aches  Objective  Blood pressure 112/72, pulse 69, height 5' 5.75" (1.67 m), weight 158 lb (71.7 kg), last menstrual period 02/22/2014, SpO2 99 %.   General: No apparent distress alert and oriented x3 mood and affect normal, dressed appropriately.  HEENT: Pupils equal, extraocular movements intact  Respiratory: Patient's speak in full sentences and does not appear short of breath  Cardiovascular: No lower extremity edema, non tender, no erythema  Gait normal with good balance and coordination.  MSK:  Non tender with full range of motion and good stability and symmetric strength and tone of shoulders, elbows, wrist, hip, knee and ankles bilaterally.  Back - back does have some loss of lordosis. Tightnes sin paraspinal and parascapular region in mostly thoracic, Mild loss of lordosis of the neck, negative spurlings, 5/5 strength of UE   Osteopathic findings   C7 flexed rotated and side bent left T3 extended rotated and side bent right inhaled rib T9 extended rotated and side bent left L2 flexed rotated and side bent right Sacrum right on right       Assessment and Plan:  Scapular dyskinesis Chronic, but mild exacerbation.  Discussed HEP, discussed which activities to do and which to avoid, some increase stress  due to family health recently.  RTC in 6 weeks     Nonallopathic problems  Decision today to treat with OMT was based on Physical Exam  After verbal consent patient was treated with HVLA, ME, FPR techniques in cervical, rib, thoracic, lumbar, and sacral  areas  Patient tolerated the procedure well with  improvement in symptoms  Patient given exercises, stretches and lifestyle modifications  See medications in patient instructions if given  Patient will follow up in 4-8 weeks      The above documentation has been reviewed and is accurate and complete [name], DO       Note: This dictation was prepared with Dragon dictation along with smaller phrase technology. Any transcriptional errors that result from this process are unintentional.

## 2020-09-19 ENCOUNTER — Ambulatory Visit: Payer: 59 | Admitting: Family Medicine

## 2020-09-19 ENCOUNTER — Other Ambulatory Visit: Payer: Self-pay

## 2020-09-19 ENCOUNTER — Encounter: Payer: Self-pay | Admitting: Family Medicine

## 2020-09-19 VITALS — BP 112/72 | HR 69 | Ht 65.75 in | Wt 158.0 lb

## 2020-09-19 DIAGNOSIS — M999 Biomechanical lesion, unspecified: Secondary | ICD-10-CM | POA: Diagnosis not present

## 2020-09-19 DIAGNOSIS — G2589 Other specified extrapyramidal and movement disorders: Secondary | ICD-10-CM

## 2020-09-19 NOTE — Assessment & Plan Note (Signed)
Chronic, but mild exacerbation.  Discussed HEP, discussed which activities to do and which to avoid, some increase stress due to family health recently.  RTC in 6 weeks

## 2020-09-19 NOTE — Patient Instructions (Signed)
See me again in 6 weeks 

## 2020-09-27 ENCOUNTER — Encounter: Payer: Self-pay | Admitting: Physician Assistant

## 2020-09-27 DIAGNOSIS — M85852 Other specified disorders of bone density and structure, left thigh: Secondary | ICD-10-CM | POA: Diagnosis not present

## 2020-09-27 DIAGNOSIS — M85851 Other specified disorders of bone density and structure, right thigh: Secondary | ICD-10-CM | POA: Diagnosis not present

## 2020-09-27 DIAGNOSIS — Z780 Asymptomatic menopausal state: Secondary | ICD-10-CM | POA: Diagnosis not present

## 2020-09-29 ENCOUNTER — Telehealth: Payer: Self-pay | Admitting: *Deleted

## 2020-09-29 NOTE — Telephone Encounter (Signed)
Received DEXA results from [facility].  Date of Scan: 09/27/2020 Lowest T-score and site measured: -1.8 Significant changes in BMD and site measured (5% and above): n/a  Current Regimen: Vitamin D  Recommendation: Calcium, Vitamin D and resistive exercises. Repeat DEXA in 2 years.

## 2020-09-29 NOTE — Telephone Encounter (Signed)
Patient advised Calcium, Vitamin D and resistive exercises. Repeat DEXA in 2 years.

## 2020-10-09 DIAGNOSIS — H524 Presbyopia: Secondary | ICD-10-CM | POA: Diagnosis not present

## 2020-10-09 DIAGNOSIS — H52222 Regular astigmatism, left eye: Secondary | ICD-10-CM | POA: Diagnosis not present

## 2020-10-26 NOTE — Progress Notes (Signed)
[name] [address] [place] [address] Phone: [phone] Subjective:   I Shelly [name] am serving as a Neurosurgeon for Dr. Antoine Primas.  This visit occurred during the SARS-CoV-2 public health emergency.  Safety protocols were in place, including screening questions prior to the visit, additional usage of staff PPE, and extensive cleaning of exam room while observing appropriate contact time as indicated for disinfecting solutions.   I'm seeing this patient by the request  of:  Cirigliano, Jearld Lesch, DO  CC: Neck and back pain follow-up  [id number]  Shelly [name] is a 59 y.o. female coming in with complaint of back and neck pain. OMT 09/19/2020. Patient states she has some back pain on the left side. Right SI joint is painful.  Patient has had to help her mother with different transferring.  Patient is actually going over there to help clean the house as well.  Patient denies any radiation of pain just more tightness than usual.  No radiation to any of the extremities.  Has not had to take any medications truly for it on a regular basis           Reviewed prior external information including notes and imaging from previsou exam, outside providers and external EMR if available.   As well as notes that were available from care everywhere and other healthcare systems.  Past medical history, social, surgical and family history all reviewed in electronic medical record.  No pertanent information unless stated regarding to the chief complaint.   Past Medical History:  Diagnosis Date  . Chronic kidney disease    kidney stones  . Depression   . Endometriosis   . Fibroid   . Fracture, tibial plateau   . GERD (gastroesophageal reflux disease)   . Heart murmur   . Hypothyroid   . Inflammatory arthritis 08/28/2016  . Insomnia 08/28/2016  . Migraine   . Osteoarthritis of both feet 08/28/2016  . Osteoarthritis of both hands 08/28/2016  .  Osteoarthritis of both knees 08/28/2016  . 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 Sicca ([facility]) 08/28/2016  . Vitamin D deficiency 08/28/2016    Allergies  Allergen Reactions  . Compazine [Prochlorperazine Edisylate] Other (See Comments)    Muscle contractions     Review of Systems:  No headache, visual changes, nausea, vomiting, diarrhea, constipation, dizziness, abdominal pain, skin rash, fevers, chills, night sweats, weight loss, swollen lymph nodes, body aches, joint swelling, chest pain, shortness of breath, mood changes. POSITIVE muscle aches  Objective  Blood pressure 110/70, pulse 67, height 5\' 7"  (1.702 m), weight 162 lb (73.5 kg), last menstrual period 02/22/2014, SpO2 98 %.   General: No apparent distress alert and oriented x3 mood and affect normal, dressed appropriately.  HEENT: Pupils equal, extraocular movements intact  Respiratory: Patient's speak in full sentences and does not appear short of breath  Cardiovascular: No lower extremity edema, non tender, no erythema  Neck exam does have some tightness on the left side of the neck.  Seems to be more tenderness there.  Low back exam mild tightness noted around the sacroiliac joints bilaterally.  Patient has a negative straight leg test. Tightness noted in the parascapular region.  No pain in the midline low.  Mild dyskinesis noted right greater than left  Osteopathic findings   C5 flexed rotated and side bent left T5 extended rotated and side bent right inhaled rib T8 extended rotated and side bent left L2 flexed rotated and side bent  right Sacrum right on right       Assessment and Plan:  Scapular dyskinesis Chronic problem with mild exacerbation.  Patient does have some baclofen for any breakthrough.  We discussed posture and ergonomics.  Tightness more in the left side of the scapular region.  Discussed home exercises.  Follow-up again in 4 to 8 weeks    Nonallopathic problems  Decision today to treat with OMT was  based on Physical Exam  After verbal consent patient was treated with HVLA, ME, FPR techniques in cervical, rib, thoracic, lumbar, and sacral  areas  Patient tolerated the procedure well with improvement in symptoms  Patient given exercises, stretches and lifestyle modifications  See medications in patient instructions if given  Patient will follow up in 4-8 weeks      The above documentation has been reviewed and is accurate and complete Judi Saa, DO       Note: This dictation was prepared with Dragon dictation along with smaller phrase technology. Any transcriptional errors that result from this process are unintentional.

## 2020-10-31 ENCOUNTER — Ambulatory Visit: Payer: 59 | Admitting: Family Medicine

## 2020-10-31 ENCOUNTER — Other Ambulatory Visit (HOSPITAL_COMMUNITY): Payer: Self-pay | Admitting: Family Medicine

## 2020-10-31 ENCOUNTER — Other Ambulatory Visit: Payer: Self-pay

## 2020-10-31 ENCOUNTER — Encounter: Payer: Self-pay | Admitting: Family Medicine

## 2020-10-31 VITALS — BP 110/70 | HR 67 | Ht 67.0 in | Wt 162.0 lb

## 2020-10-31 DIAGNOSIS — G2589 Other specified extrapyramidal and movement disorders: Secondary | ICD-10-CM | POA: Diagnosis not present

## 2020-10-31 DIAGNOSIS — M999 Biomechanical lesion, unspecified: Secondary | ICD-10-CM

## 2020-10-31 MED FILL — LEVOTHYROXINE 75 MCG TABLET: 75 | 90 days supply | Qty: 90 | Fill #0

## 2020-10-31 NOTE — Patient Instructions (Addendum)
Good to see you Sorry for your loss Watch the posture See me again in 6 weeks

## 2020-10-31 NOTE — Assessment & Plan Note (Signed)
Chronic problem with mild exacerbation.  Patient does have some baclofen for any breakthrough.  We discussed posture and ergonomics.  Tightness more in the left side of the scapular region.  Discussed home exercises.  Follow-up again in 4 to 8 weeks

## 2020-11-02 ENCOUNTER — Other Ambulatory Visit (HOSPITAL_COMMUNITY): Payer: Self-pay | Admitting: Family Medicine

## 2020-11-03 ENCOUNTER — Other Ambulatory Visit (HOSPITAL_COMMUNITY): Payer: Self-pay | Admitting: Family Medicine

## 2020-11-03 MED FILL — ESTRADIOL 0.05 MG PATCH: 0.05 | 28 days supply | Qty: 8 | Fill #0

## 2020-11-03 MED FILL — PROGESTERONE 100 MG CAPS: 100 | 30 days supply | Qty: 26 | Fill #0

## 2020-11-30 MED FILL — PROGESTERONE 100 MG CAPS: 100 | 90 days supply | Qty: 78 | Fill #1

## 2020-11-30 MED FILL — ESTRADIOL 0.05 MG PATCH: 0.05 | 84 days supply | Qty: 24 | Fill #1

## 2020-12-11 NOTE — Progress Notes (Unsigned)
[place] Phone: [phone] Subjective:   [name], am serving as a scribe for Dr. Hulan Saas. This visit occurred during the SARS-CoV-2 public health emergency.  Safety protocols were in place, including screening questions prior to the visit, additional usage of staff PPE, and extensive cleaning of exam room while observing appropriate contact time as indicated for disinfecting solutions.   I'm seeing this patient by the request  of:  Cirigliano, Garvin Fila, DO  CC: Neck pain, bilateral elbow pain  Shelly:IRSWNIOEVO  FAVEN Shelly [name] is a 60 y.o. female coming in with complaint of back and neck pain. OMT 11/01/2019. Pain worst at night. Back pain is ok since last visit.  Patient has some increased discomfort in her bilateral elbows.  Has been told before she has had cubital tunnel syndrome.  Patient has had MRIs and nerve conduction studies previously.  Feels like it is worsening.  Wanting to know if there is anything else she can do.  Feels like it was diagnosed greater than 10 to 12 years ago from an outside source she states.  Medications patient has been prescribed: None         Reviewed prior external information including notes and imaging from previsou exam, outside providers and external EMR if available.   As well as notes that were available from care everywhere and other healthcare systems.  Past medical history, social, surgical and family history all reviewed in electronic medical record.  No pertanent information unless stated regarding to the chief complaint.   Past Medical History:  Diagnosis Date  . Chronic kidney disease    kidney stones  . Depression   . Endometriosis   . Fibroid   . Fracture, tibial plateau   . GERD (gastroesophageal reflux disease)   . Heart murmur   . Hypothyroid   . Inflammatory arthritis 08/28/2016  . Insomnia 08/28/2016  . Migraine   . Osteoarthritis of both  feet 08/28/2016  . Osteoarthritis of both hands 08/28/2016  . Osteoarthritis of both knees 08/28/2016  . 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 Sicca ([place]) 08/28/2016  . Vitamin D deficiency 08/28/2016    Allergies  Allergen Reactions  . Compazine [Prochlorperazine Edisylate] Other (See Comments)    Muscle contractions     Review of Systems:  No headache, visual changes, nausea, vomiting, diarrhea, constipation, dizziness, abdominal pain, skin rash, fevers, chills, night sweats, weight loss, swollen lymph nodes, body aches, joint swelling, chest pain, shortness of breath, mood changes. POSITIVE muscle aches  Objective  Last menstrual period 02/22/2014.   General: No apparent distress alert and oriented x3 mood and affect normal, dressed appropriately.  HEENT: Pupils equal, extraocular movements intact  Respiratory: Patient's speak in full sentences and does not appear short of breath  Cardiovascular: No lower extremity edema, non tender, no erythema  Gait normal with good balance and coordination.  Bilateral elbow exam shows the patient does have some mild tenderness on the medial aspect.  Patient does have full range of motion.  Good grip strength noted.  Neck exam does have some mild loss of lordosis.  Tightness noted in the right scapular region.  Patient's neck does have some mild tightness with sidebending right greater than left. Low back exam tightness noted in the paraspinal musculature.  Mild tightness with right-sided Corky Sox but negative straight leg test.  5 out of 5 strength in lower extremities.  Osteopathic findings  C5 flexed rotated and side  bent left T3 extended rotated and side bent right inhaled rib T8 extended rotated and side bent left L2 flexed rotated and side bent right Sacrum right on right       Assessment and Plan:   Scapular dyskinesis Patient continues to have some scapular dyskinesis.  Responds very well to osteopathic manipulation.  Discussed posture and  ergonomics discussed which activities to do which wants to avoid.  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  Discussed with patient and will start to send patient to formal physical therapy to see if dry needling and other modalities could be beneficial.  Worsening symptoms will refer back to hand surgery to discuss   Nonallopathic problems  Decision today to treat with OMT was based on Physical Exam  After verbal consent patient was treated with HVLA, ME, FPR techniques in cervical, rib, thoracic, lumbar, and sacral  areas  Patient tolerated the procedure well with improvement in symptoms  Patient given exercises, stretches and lifestyle modifications  See medications in patient instructions if given  Patient will follow up in 4-8 weeks      The above documentation has been reviewed and is accurate and complete [name], DO       Note: This dictation was prepared with Dragon dictation along with smaller phrase technology. Any transcriptional errors that result from this process are unintentional.

## 2020-12-12 ENCOUNTER — Ambulatory Visit: Payer: 59 | Admitting: Family Medicine

## 2020-12-12 ENCOUNTER — Encounter: Payer: Self-pay | Admitting: Family Medicine

## 2020-12-12 ENCOUNTER — Other Ambulatory Visit: Payer: Self-pay

## 2020-12-12 VITALS — BP 128/86 | HR 73 | Ht 67.0 in | Wt 165.0 lb

## 2020-12-12 DIAGNOSIS — G5620 Lesion of ulnar nerve, unspecified upper limb: Secondary | ICD-10-CM

## 2020-12-12 DIAGNOSIS — G5623 Lesion of ulnar nerve, bilateral upper limbs: Secondary | ICD-10-CM | POA: Diagnosis not present

## 2020-12-12 DIAGNOSIS — G2589 Other specified extrapyramidal and movement disorders: Secondary | ICD-10-CM | POA: Diagnosis not present

## 2020-12-12 DIAGNOSIS — M999 Biomechanical lesion, unspecified: Secondary | ICD-10-CM

## 2020-12-12 NOTE — Assessment & Plan Note (Signed)
Patient continues to have some scapular dyskinesis.  Responds very well to osteopathic manipulation.  Discussed posture and ergonomics discussed which activities to do which wants to avoid.  Follow-up again in 6 to 8 weeks

## 2020-12-12 NOTE — Assessment & Plan Note (Signed)
Patient states history of this for quite some time.  Discussed with patient and will start to send patient to formal physical therapy to see if dry needling and other modalities could be beneficial.  Worsening symptoms will refer back to hand surgery to discuss

## 2020-12-12 NOTE — Patient Instructions (Signed)
Pt [place] Cubital tunnel syndrome Avoid impact on elbows or resting them on desk or in your car Pen[place]aid 2x a day See me again in 6-7 weeks

## 2020-12-26 ENCOUNTER — Ambulatory Visit: Payer: 59 | Attending: Family Medicine | Admitting: Physical Therapy

## 2020-12-26 ENCOUNTER — Other Ambulatory Visit: Payer: Self-pay

## 2020-12-26 ENCOUNTER — Encounter: Payer: Self-pay | Admitting: Physical Therapy

## 2020-12-26 DIAGNOSIS — M25521 Pain in right elbow: Secondary | ICD-10-CM | POA: Insufficient documentation

## 2020-12-26 DIAGNOSIS — M25522 Pain in left elbow: Secondary | ICD-10-CM | POA: Diagnosis not present

## 2020-12-26 DIAGNOSIS — M546 Pain in thoracic spine: Secondary | ICD-10-CM | POA: Insufficient documentation

## 2020-12-26 DIAGNOSIS — M62838 Other muscle spasm: Secondary | ICD-10-CM | POA: Insufficient documentation

## 2020-12-26 NOTE — Patient Instructions (Signed)
Access Code: [email or web address] URL: [email or web address] Date: 12/26/2020 Prepared by: Lum Babe  Exercises Standing Ulnar Nerve Glide - 2 x daily - 7 x weekly - 1 sets - 5 reps - 10 hold Ulnar Nerve Flossing - 2 x daily - 7 x weekly - 1 sets - 5 reps - 10 hold Standing Median Nerve Glide - 2 x daily - 7 x weekly - 1 sets - 5 reps - 10 hold Median Nerve Tensioner - 2 x daily - 7 x weekly - 1 sets - 5 reps - 10 hold Gentle Levator Scapulae Stretch - 2 x daily - 7 x weekly - 1 sets - 5 reps - 10 hold

## 2020-12-26 NOTE — Therapy (Signed)
[place] [address] [place] [place], [place], [address] Phone: [phone]   Fax:  [phone]  Physical Therapy Evaluation  Patient Details  Name: Shelly [name] MRN: [id number] Date of Birth: April 25, 1961 Referring Provider (PT): Creig Hines   Encounter Date: 12/26/2020   PT End of Session - 12/26/20 1043    Visit Number 1    Number of Visits 25    Date for PT Re-Evaluation 02/25/21    Authorization Type [facility]    PT Start Time 1008    PT Stop Time 1046    PT Time Calculation (min) 38 min    Activity Tolerance Patient tolerated treatment well    Behavior During Therapy [facility] for tasks assessed/performed           Past Medical History:  Diagnosis Date  . Chronic kidney disease    kidney stones  . Depression   . Endometriosis   . Fibroid   . Fracture, tibial plateau   . GERD (gastroesophageal reflux disease)   . Heart murmur   . Hypothyroid   . Inflammatory arthritis 08/28/2016  . Insomnia 08/28/2016  . Migraine   . Osteoarthritis of both feet 08/28/2016  . Osteoarthritis of both hands 08/28/2016  . Osteoarthritis of both knees 08/28/2016  . 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 Sicca ([place]) 08/28/2016  . Vitamin D deficiency 08/28/2016    Past Surgical History:  Procedure Laterality Date  . LAPAROSCOPY  1994   endometriosis  . PELVIC LAPAROSCOPY  1993   d/t endometriosis--Dr. Matthew Saras    There were no vitals filed for this visit.    Subjective Assessment - 12/26/20 1010    Subjective Patietn reports that she has had bilateral elbow pain over the past few years.  She has been diagnosed with cubital tunnel syndrome.  She also has been treated for scapular dyskinesis with spasms and tightness in the upper trap and rhomboid area    Patient Stated Goals have less pain, sleep better    Currently in Pain? Yes    Pain Score 3     Pain Location Elbow    Pain Orientation Right;Left;Medial    Pain Descriptors / Indicators Burning     Pain Type Chronic pain    Pain Radiating Towards at night will have numbness in the hands during sleep with bent elbows    Pain Onset More than a month ago    Pain Frequency Constant    Aggravating Factors  sleeping at night, stress pain up to 6/10    Pain Relieving Factors straight arms at best a 2/10    Effect of Pain on Daily Activities difficulty sleeping              [facility] PT Assessment - 12/26/20 0001      Assessment   Medical Diagnosis bilateral cubital tunnel syndrome, thoracic pain and spasm    Referring Provider (PT) Z. Smith    Onset Date/Surgical Date 12/12/20    Hand Dominance Right    Prior Therapy no      Precautions   Precautions None      Balance Screen   Has the patient fallen in the past 6 months No    Has the patient had a decrease in activity level because of a fear of falling?  No    Is the patient reluctant to leave their home because of a fear of falling?  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#    Leisure no exercise      Posture/Postural Control   Posture Comments flat thoracic spine into the cervical area, some rounding of the shoulders      ROM / Strength   AROM / PROM / Strength AROM;Strength      AROM   Overall AROM Comments cervical ROM decreased 25% for all motions except side bending decreased 50%, wrist ROM is mildly limited, elwo ROM she goes about 10 degrees into hyper extension      Strength   Overall Strength Comments Shoulder, elbow and wrist strength are WFL's but reports pain in the elbows with all of these, "feels like pulling out of socket at the elbow"      Palpation   Palpation comment tight and tender with spasms in the upper trap, rhomboids, she has some trigger points in the right  upper latreal arm and laterally at the elbow, tenderness is mostly in the medial elbow      Special Tests   Other special tests some neural tension in the ulnar and median nerve                      Objective measurements completed on examination: See above findings.                 PT Short Term Goals - 12/26/20 1048      PT SHORT TERM GOAL #1   Title independent with initial HEP    Time 2    Period Weeks    Status New             PT Long Term Goals - 12/26/20 1048      PT LONG TERM GOAL #1   Title independent with advanced HEP    Time 8    Period Weeks    Status New      PT LONG TERM GOAL #2   Title understand posture and body mechanics    Time 8    Period Weeks    Status New      PT LONG TERM GOAL #3   Title report 50% less numbness at night    Time 8    Period Weeks    Status New      PT LONG TERM GOAL #4   Title report 50% less pain    Time 8    Period Weeks    Status New                  Plan - 12/26/20 1044    Clinical Impression Statement Patient reports diagnosed in bilateral elbows with cubital tunnel syndrome, she has pain i the medial elbows, reports worse at night with numbness.  She has elbows that go into hyper extension, she had some tightness and pain with ulnar and median nerve tension tests, she has some ROM limitation in the cervical spine, she has a lot of spasms in the upper traps and rhomboids.  there are a few trigger points in the right upper lateral arm and elbow    Stability/Clinical Decision Making Stable/Uncomplicated    Clinical Decision Making Low    Rehab Potential Good    PT Frequency 1x / week  PT Duration 8 weeks    PT Treatment/Interventions ADLs/Self Care Home Management;Electrical Stimulation;Moist Heat;Ultrasound;Therapeutic activities;Therapeutic exercise;Neuromuscular re-education;Manual techniques;Patient/family education;Dry needling    PT Next Visit Plan start scapular stability,  continue with neural stretches, will try DN after the 14th    Consulted and Agree with Plan of Care Patient           Patient will benefit from skilled therapeutic intervention in order to improve the following deficits and impairments:  Decreased range of motion,Increased muscle spasms,Pain,Improper body mechanics,Postural dysfunction,Decreased strength  Visit Diagnosis: Other muscle spasm - Plan: PT plan of care cert/re-cert  Pain in left elbow - Plan: PT plan of care cert/re-cert  Pain in right elbow - Plan: PT plan of care cert/re-cert  Pain in thoracic spine - Plan: PT plan of care cert/re-cert     Problem List Patient Active Problem List   Diagnosis Date Noted  . Anxiety 08/11/2019  . 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 Cubital tunnel syndrome 10/07/2018  . Nonallopathic lesion of rib cage 07/08/2018  . Nonallopathic lesion of sacral region 07/08/2018  . 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 Osteoarthritis of both hands 08/28/2016  . Osteoarthritis of both feet 08/28/2016  . Osteoarthritis of both knees 08/28/2016  . Insomnia 08/28/2016  . 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 Vitamin D deficiency 08/28/2016  . Trapezius muscle spasm 08/28/2016  . Chronic neck pain 03/01/2016  . Scapular dyskinesis 03/01/2016  . Nonallopathic lesion of cervical region 03/01/2016  . Nonallopathic lesion of thoracic region 03/01/2016  . Nonallopathic lesion of lumbosacral region 03/01/2016  . DUB (dysfunctional uterine bleeding) 01/05/2014  . Family history of abdominal aortic aneurysm 08/12/2012  . Renal calculus, left 06/04/2012  . Irritability 05/01/2012  . GERD (gastroesophageal reflux disease) 05/01/2012  . History of migraine 05/01/2012  . Hypothyroidism 05/01/2012  . History of renal calculi 05/01/2012  . Leukopenia 05/01/2012  . 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 Allergic rhinitis due to allergen 05/01/2012    Sumner Boast.,  PT 12/26/2020, 10:51 AM  [place]. [place], [place], [address] Phone: [phone]   Fax:  [phone]  Name: Shelly [name] MRN: [id number] Date of Birth: 1961-09-28

## 2021-01-02 ENCOUNTER — Other Ambulatory Visit: Payer: Self-pay

## 2021-01-02 ENCOUNTER — Ambulatory Visit: Payer: 59

## 2021-01-02 DIAGNOSIS — M546 Pain in thoracic spine: Secondary | ICD-10-CM

## 2021-01-02 DIAGNOSIS — M25521 Pain in right elbow: Secondary | ICD-10-CM | POA: Diagnosis not present

## 2021-01-02 DIAGNOSIS — M62838 Other muscle spasm: Secondary | ICD-10-CM | POA: Diagnosis not present

## 2021-01-02 DIAGNOSIS — M25522 Pain in left elbow: Secondary | ICD-10-CM

## 2021-01-02 NOTE — Therapy (Signed)
[place]. [place], [place], [address] Phone: [phone]   Fax:  [phone]  Physical Therapy Treatment  Patient Details  Name: Shelly [name] MRN: [id number] Date of Birth: 27-Mar-1961 Referring Provider (PT): Creig Hines   Encounter Date: 01/02/2021   PT End of Session - 01/02/21 0928    Visit Number 2    Number of Visits 25    Date for PT Re-Evaluation 02/25/21    Authorization Type [facility]    PT Start Time 0847    PT Stop Time 0925    PT Time Calculation (min) 38 min    Activity Tolerance Patient tolerated treatment well    Behavior During Therapy [facility] for tasks assessed/performed           Past Medical History:  Diagnosis Date  . Chronic kidney disease    kidney stones  . Depression   . Endometriosis   . Fibroid   . Fracture, tibial plateau   . GERD (gastroesophageal reflux disease)   . Heart murmur   . Hypothyroid   . Inflammatory arthritis 08/28/2016  . Insomnia 08/28/2016  . Migraine   . Osteoarthritis of both feet 08/28/2016  . Osteoarthritis of both hands 08/28/2016  . Osteoarthritis of both knees 08/28/2016  . 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 Sicca ([place]) 08/28/2016  . Vitamin D deficiency 08/28/2016    Past Surgical History:  Procedure Laterality Date  . LAPAROSCOPY  1994   endometriosis  . PELVIC LAPAROSCOPY  1993   d/t endometriosis--Dr. Matthew Saras    There were no vitals filed for this visit.   Subjective Assessment - 01/02/21 0848    Subjective Pt reports she has been doing exercises and feels like they are helping a bit    Patient Stated Goals have less pain, sleep better    Currently in Pain? Yes    Pain Score 3     Pain Location Elbow    Pain Orientation Right;Left    Pain Onset More than a month ago                             [facility] Adult PT Treatment/Exercise - 01/02/21 0001      Exercises   Exercises Shoulder;Elbow;Wrist;Hand;Neck      Elbow Exercises    Elbow Extension Strengthening;10 reps;Both    Theraband Level (Elbow Extension) Level 1 (Yellow)      Neck Exercises: Theraband   Scapula Retraction 10 reps   BW with   Shoulder External Rotation Limitations yellow TB x 10 ER at neutral      Shoulder Exercises: Supine   Protraction Limitations x10 closed chain protraction on wall, Ball on wall CW/CCW x 10      Wrist Exercises   Wrist Flexion Strengthening;10 reps;Both    Bar Weights/Barbell (Wrist Flexion) 2 lbs    Wrist Extension Strengthening;Both;10 reps    Bar Weights/Barbell (Wrist Extension) 2 lbs      Manual Therapy   Manual therapy comments STM B Upper traps, LS, medial elbows, periscapular mms.      Neck Exercises: Stretches   Levator Stretch Limitations L & R - 5 x 10 seconds each    Neural Stretch Median N Glide, Median N tensioner 5 x 10" each. Ulnar N glide, Ulnar n floss 5 x 10" each. Bilateral    Other Neck Stretches Rhomboid stretch in sitting x 20 sec  PT Short Term Goals - 12/26/20 1048      PT SHORT TERM GOAL #1   Title independent with initial HEP    Time 2    Period Weeks    Status New             PT Long Term Goals - 12/26/20 1048      PT LONG TERM GOAL #1   Title independent with advanced HEP    Time 8    Period Weeks    Status New      PT LONG TERM GOAL #2   Title understand posture and body mechanics    Time 8    Period Weeks    Status New      PT LONG TERM GOAL #3   Title report 50% less numbness at night    Time 8    Period Weeks    Status New      PT LONG TERM GOAL #4   Title report 50% less pain    Time 8    Period Weeks    Status New                 Plan - 01/02/21 4268    Clinical Impression Statement Pt tolerated todays session nicely. Able to reverse demo HEP exercises with min cues provided for performance. Tolerated progression of neck and UE neural glides/neurodynamics and strengthening well.   Session concluded with manual tx  tlreated nicely. Plan to follow up on tolerance to todays exercises next visit.    Stability/Clinical Decision Making Stable/Uncomplicated    Rehab Potential Good    PT Frequency 1x / week    PT Duration 8 weeks    PT Treatment/Interventions ADLs/Self Care Home Management;Electrical Stimulation;Moist Heat;Ultrasound;Therapeutic activities;Therapeutic exercise;Neuromuscular re-education;Manual techniques;Patient/family education;Dry needling    PT Next Visit Plan start scapular stability, continue with neural stretches, will try DN after the 14th           Patient will benefit from skilled therapeutic intervention in order to improve the following deficits and impairments:  Decreased range of motion,Increased muscle spasms,Pain,Improper body mechanics,Postural dysfunction,Decreased strength  Visit Diagnosis: Other muscle spasm  Pain in left elbow  Pain in right elbow  Pain in thoracic spine     Problem List Patient Active Problem List   Diagnosis Date Noted  . Anxiety 08/11/2019  . 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 Cubital tunnel syndrome 10/07/2018  . Nonallopathic lesion of rib cage 07/08/2018  . Nonallopathic lesion of sacral region 07/08/2018  . 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 Osteoarthritis of both hands 08/28/2016  . Osteoarthritis of both feet 08/28/2016  . Osteoarthritis of both knees 08/28/2016  . Insomnia 08/28/2016  . 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 Vitamin D deficiency 08/28/2016  . Trapezius muscle spasm 08/28/2016  . Chronic neck pain 03/01/2016  . Scapular dyskinesis 03/01/2016  . Nonallopathic lesion of cervical region 03/01/2016  . Nonallopathic lesion of thoracic region 03/01/2016  . Nonallopathic lesion of lumbosacral region 03/01/2016  . DUB (dysfunctional uterine bleeding) 01/05/2014  . Family history of abdominal aortic aneurysm 08/12/2012  . Renal calculus, left 06/04/2012  . Irritability 05/01/2012  . GERD  (gastroesophageal reflux disease) 05/01/2012  . History of migraine 05/01/2012  . Hypothyroidism 05/01/2012  . History of renal calculi 05/01/2012  . Leukopenia 05/01/2012  . 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 Allergic rhinitis due to allergen 05/01/2012    Jonell Cluck Beauford Lando, PT, DPT 01/02/2021, 10:14 AM  [address]. [place], [place], [address] Phone: [phone]   Fax:  [phone]  Name: Shelly [name] MRN: [id number] Date of Birth: 1961/08/11

## 2021-01-04 ENCOUNTER — Ambulatory Visit: Payer: 59

## 2021-01-09 ENCOUNTER — Ambulatory Visit: Payer: 59

## 2021-01-14 ENCOUNTER — Other Ambulatory Visit: Payer: Self-pay

## 2021-01-14 ENCOUNTER — Emergency Department
Admission: EM | Admit: 2021-01-14 | Discharge: 2021-01-14 | Disposition: A | Discharge status: Home / Self Care | Payer: 59 | Source: Home / Self Care

## 2021-01-14 ENCOUNTER — Encounter: Payer: Self-pay | Admitting: Emergency Medicine

## 2021-01-14 DIAGNOSIS — R350 Frequency of micturition: Secondary | ICD-10-CM

## 2021-01-14 DIAGNOSIS — R300 Dysuria: Secondary | ICD-10-CM

## 2021-01-14 LAB — POCT URINALYSIS DIP (MANUAL ENTRY)
Bilirubin, UA: NEGATIVE
Glucose, UA: NEGATIVE mg/dL
Ketones, POC UA: NEGATIVE mg/dL
Leukocytes, UA: NEGATIVE
Nitrite, UA: NEGATIVE
Protein Ur, POC: NEGATIVE mg/dL
Spec Grav, UA: >=1.03 — AB (ref 1.010–1.025)
Urobilinogen, UA: 0.2 E.U./dL
pH, UA: 5.5 (ref 5.0–8.0)

## 2021-01-14 NOTE — Discharge Instructions (Addendum)
Follow up with PCP / urology  Go to ED with worsening symptoms.

## 2021-01-14 NOTE — ED Provider Notes (Signed)
[name]    CSN: [id number] Arrival date & time: 01/14/21  2993      History   Chief Complaint Chief Complaint  Patient presents with  . Nephrolithiasis    HPI Shelly [name] is a 60 y.o. female.   Patient here c/w "kidney stone or UTI" x 2 days.  She reports she has had kidney stones several times since age 50 and this feels similar to previous stones, most recent stone 2013.  She admits "burning" sensation along pelvic/bladder area, frequency, urgency, hematuria.  Denies f/c, n/v/d, flank pain, vaginal bleeding, vaginal d/c.       Past Medical History:  Diagnosis Date  . Chronic kidney disease    kidney stones  . Depression   . Endometriosis   . Fibroid   . Fracture, tibial plateau   . GERD (gastroesophageal reflux disease)   . Heart murmur   . Hypothyroid   . Inflammatory arthritis 08/28/2016  . Insomnia 08/28/2016  . Migraine   . Osteoarthritis of both feet 08/28/2016  . Osteoarthritis of both hands 08/28/2016  . Osteoarthritis of both knees 08/28/2016  . 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 Sicca ([place]) 08/28/2016  . Vitamin D deficiency 08/28/2016    Patient Active Problem List   Diagnosis Date Noted  . Anxiety 08/11/2019  . 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 Cubital tunnel syndrome 10/07/2018  . Nonallopathic lesion of rib cage 07/08/2018  . Nonallopathic lesion of sacral region 07/08/2018  . 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 Osteoarthritis of both hands 08/28/2016  . Osteoarthritis of both feet 08/28/2016  . Osteoarthritis of both knees 08/28/2016  . Insomnia 08/28/2016  . 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 Vitamin D deficiency 08/28/2016  . Trapezius muscle spasm 08/28/2016  . Chronic neck pain 03/01/2016  . Scapular dyskinesis 03/01/2016  . Nonallopathic lesion of cervical region 03/01/2016  . Nonallopathic lesion of thoracic region 03/01/2016  . Nonallopathic lesion of lumbosacral region 03/01/2016  . DUB  (dysfunctional uterine bleeding) 01/05/2014  . Family history of abdominal aortic aneurysm 08/12/2012  . Renal calculus, left 06/04/2012  . Irritability 05/01/2012  . GERD (gastroesophageal reflux disease) 05/01/2012  . History of migraine 05/01/2012  . Hypothyroidism 05/01/2012  . History of renal calculi 05/01/2012  . Leukopenia 05/01/2012  . 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 Allergic rhinitis due to allergen 05/01/2012    Past Surgical History:  Procedure Laterality Date  . LAPAROSCOPY  1994   endometriosis  . PELVIC LAPAROSCOPY  1993   d/t endometriosis--Dr. Matthew Saras    OB History    Gravida  0   Para  0   Term  0   Preterm  0   AB  0   Living  0     SAB  0   IAB  0   Ectopic  0   Multiple  0   Live Births           Obstetric Comments  Has 2 adopted children         Home Medications    Prior to Admission medications   Medication Sig Start Date End Date Taking? Authorizing Provider  baclofen (LIORESAL) 10 MG tablet TAKE 1 TABLET BY MOUTH 3 TIMES DAILY AS NEEDED FOR MUSCLE PAIN 08/31/20  Yes Ofilia Neas, PA-C  cholecalciferol (VITAMIN D) 1000 units tablet Take 10,000 Units by mouth daily.    Yes [provider]  estradiol (VIVELLE-DOT) 0.05 MG/24HR patch 1 patch 2 (two)  times a week. 11/29/20  Yes [provider]  levothyroxine (SYNTHROID) 75 MCG tablet Take 1 tablet (75 mcg total) by mouth daily. 05/14/19  Yes Cirigliano, Mary K, DO  Prasterone, DHEA, (DHEA ADVANCED FORMULA PO) Take by mouth.   Yes [provider]  progesterone (PROMETRIUM) 100 MG capsule Take by mouth. 11/30/20  Yes [provider]  estradiol (ESTRACE) 0.1 MG/GM vaginal cream Place vaginally. 08/30/20   [provider]  estrogens-methylTEST (ESTRATEST) 1.25-2.5 MG tablet Take 1 tablet by mouth daily.    [provider]  hydrOXYzine (ATARAX/VISTARIL) 10 MG tablet Take 1 tablet (10 mg total) by mouth 3 (three) times daily  as needed. 08/11/19   [name], DO  progesterone (ENDOMETRIN) 100 MG vaginal insert Place 100 mg vaginally 2 (two) times daily.    [provider]    Family History Family History  Problem Relation Age of Onset  . Hypertension Mother   . Dementia Mother   . Hypertension Father   . Stroke Father   . Heart disease Father   . Hypertension Brother   . Hypertension Maternal Grandmother   . Hypertension Maternal Grandfather   . Kidney failure Maternal Grandfather   . Aneurysm Maternal Grandfather   . Cancer Maternal Aunt 42       breast  . Aneurysm Maternal Aunt   . Breast cancer Maternal Aunt   . Cancer Cousin        breast  . Breast cancer Cousin     Social History Social History   Tobacco Use  . Smoking status: Never Smoker  . Smokeless tobacco: Never Used  Vaping Use  . Vaping Use: Never used  Substance Use Topics  . Alcohol use: No  . Drug use: No     Allergies   Compazine [prochlorperazine edisylate]   Review of Systems Review of Systems  Constitutional: Negative for chills, fatigue and fever.  Gastrointestinal: Negative for abdominal pain, diarrhea, nausea and vomiting.  Genitourinary: Positive for dysuria, frequency, hematuria and pelvic pain. Negative for decreased urine volume, difficulty urinating, dyspareunia, flank pain, menstrual problem, urgency, vaginal bleeding and vaginal discharge.  Musculoskeletal: Negative for arthralgias, back pain and myalgias.  Skin: Negative for color change and rash.  Neurological: Negative for seizures and syncope.  Psychiatric/Behavioral: Negative for sleep disturbance. The patient is not nervous/anxious.   All other systems reviewed and are negative.    Physical Exam Triage Vital Signs ED Triage Vitals  Enc Vitals Group     BP 01/14/21 0842 124/85     Pulse Rate 01/14/21 0842 74     Resp --      Temp 01/14/21 0842 97.8 F (36.6 C)     Temp Source 01/14/21 0842 Oral     SpO2 01/14/21 0842 98 %      Weight --      Height --      Head Circumference --      Peak Flow --      Pain Score 01/14/21 0843 4     Pain Loc --      Pain Edu? --      Excl. in [place]? --    No data found.  Updated Vital Signs BP 124/85 (BP Location: Left Arm)   Pulse 74   Temp 97.8 F (36.6 C) (Oral)   LMP 02/22/2014   SpO2 98%   Visual Acuity Right Eye Distance:   Left Eye Distance:   Bilateral Distance:    Right Eye  Near:   Left Eye Near:    Bilateral Near:     Physical Exam Vitals and nursing note reviewed.  Constitutional:      General: She is not in acute distress.    Appearance: Normal appearance. She is well-developed. She is not ill-appearing.  HENT:     Head: Normocephalic and atraumatic.     Nose: Nose normal.  Eyes:     General: No scleral icterus.    Conjunctiva/sclera: Conjunctivae normal.     Pupils: Pupils are equal, round, and reactive to light.  Pulmonary:     Effort: Pulmonary effort is normal. No respiratory distress.  Abdominal:     General: Bowel sounds are normal.     Palpations: Abdomen is soft.     Tenderness: There is no abdominal tenderness. There is no right CVA tenderness, left CVA tenderness or guarding.  Musculoskeletal:     Cervical back: Normal range of motion. No rigidity.  Skin:    General: Skin is warm and dry.  Neurological:     General: No focal deficit present.     Mental Status: She is alert and oriented to person, place, and time.     Motor: No weakness.     Gait: Gait normal.  Psychiatric:        Mood and Affect: Mood normal.        Behavior: Behavior normal.      UC Treatments / Results  Labs (all labs ordered are listed, but only abnormal results are displayed) Labs Reviewed  POCT URINALYSIS DIP (MANUAL ENTRY) - Abnormal; Notable for the following components:      Result Value   Spec Grav, UA >=1.030 (*)    Blood, UA large (*)    All other components within normal limits  URINE CULTURE    EKG   Radiology No results  found.  Procedures Procedures (including critical care time)  Medications Ordered in UC Medications - No data to display  Initial Impression / Assessment and Plan / UC Course  I have reviewed the triage vital signs and the nursing notes.  Pertinent labs & imaging results that were available during my care of the patient were reviewed by me and considered in my medical decision making (see chart for details).     Discussed possible causes of pain, including ovarian cyst, UTI, kidney stone.  I will send urine out for C&S, if it's positive we will send antibiotics to pharmacy. ED precautions provided Patient to follow up with urology, she will contact them on Monday. Final Clinical Impressions(s) / UC Diagnoses   Final diagnoses:  Urinary frequency  Dysuria     Discharge Instructions     Follow up with PCP / urology  Go to ED with worsening symptoms.      ED Prescriptions    None     PDMP not reviewed this encounter.   Peri Jefferson, PA-C 01/14/21 [phone]

## 2021-01-14 NOTE — ED Triage Notes (Signed)
Patient c/o left lower sided pain, possible kidney stone x 2 days.  Patient is having urgency, frequency, some hematuria.  Patient does have a history of kidney stones.

## 2021-01-16 LAB — URINE CULTURE
MICRO NUMBER:: 11670063
Result:: NO GROWTH
SPECIMEN QUALITY:: ADEQUATE

## 2021-01-17 NOTE — Progress Notes (Signed)
Shelly [address] [name] Phone: [phone] Subjective:   I [name] am serving as a Education administrator for Dr. Hulan Saas.  This visit occurred during the SARS-CoV-2 public health emergency.  Safety protocols were in place, including screening questions prior to the visit, additional usage of staff PPE, and extensive cleaning of exam room while observing appropriate contact time as indicated for disinfecting solutions.   I'm seeing this patient by the request  of:  Cirigliano, Garvin Fila, DO  CC: Neck and back pain follow-up  [id number]  Shelly [name] is a 60 y.o. female coming in with complaint of back and neck pain. OMT 12/12/2020. Patient has been doing PT. Patient states she is doing well today.  Patient has been working with physical therapy for the elbows.  Feels like they have made significant progress so far.  Medications patient has been prescribed: None  Taking:         Reviewed prior external information including notes and imaging from previsou exam, outside providers and external EMR if available.   As well as notes that were available from care everywhere and other healthcare systems.  Past medical history, social, surgical and family history all reviewed in electronic medical record.  No pertanent information unless stated regarding to the chief complaint.   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 POSITIVE muscle aches  Objective  Blood pressure 110/82, pulse 70, height 5\' 7"  (1.702 m), weight 161 lb (73 kg), last menstrual period 02/22/2014, SpO2 99 %.   General: No apparent distress alert and oriented x3 mood and affect normal, dressed appropriately.  HEENT: Pupils equal, extraocular movements intact  Respiratory: Patient's speak in full sentences and does not appear short of breath  Cardiovascular: No lower extremity edema, non tender, no erythema  Gait normal with good balance and coordination.  MSK: Elbow exam shows the patient has full range of motion.  Nontender on exam today. Back -mild loss of lordosis.  Tenderness to palpation of the right side of the neck.  Patient has increasing discomfort in the right parascapular region as well.   Osteopathic findings  C2 flexed rotated and side bent right C4 flexed rotated and side bent left T3 extended rotated and side bent right inhaled rib T8 extended rotated and side bent left L2 flexed rotated and side bent right Sacrum right on right       Assessment and Plan:  Chronic neck pain Mild increase in neck pain recently.  Chronic problem with mild exacerbation.  Patient has had some mild increase in  stress recently with planning daughter's wedding.  Likely contributing to some of it.  Not doing the exercises as regularly.  Discussed icing regimen and home exercises.  Discussed avoiding certain activities.  Follow-up with me again in 4 to 8 weeks  Ulnar nerve entrapment at elbow Patient has had this difficulty previously as well.  Is responding well though to  physical therapy.  No need for any significant changes.  If worsening pain would consider the possibility of gabapentin or possible injections also possible nerve conduction study.  Follow-up again in 6 weeks    Nonallopathic problems  Decision today to treat with OMT was based on Physical Exam  After verbal consent patient was treated with HVLA, ME, FPR techniques in cervical, rib, thoracic, lumbar, and sacral  areas  Patient tolerated the procedure well with improvement in symptoms  Patient given exercises, stretches and lifestyle modifications  See medications in patient instructions if given  Patient will follow up in 4-8 weeks      The above documentation has been reviewed and is accurate and complete Shelly Pulley, DO       Note: This dictation was prepared with Dragon dictation along with smaller phrase technology. Any transcriptional errors that result from this process are unintentional.

## 2021-01-18 ENCOUNTER — Ambulatory Visit: Payer: 59 | Admitting: Family Medicine

## 2021-01-18 ENCOUNTER — Encounter: Payer: Self-pay | Admitting: Family Medicine

## 2021-01-18 ENCOUNTER — Ambulatory Visit: Payer: 59

## 2021-01-18 ENCOUNTER — Other Ambulatory Visit: Payer: Self-pay

## 2021-01-18 VITALS — BP 110/82 | HR 70 | Ht 67.0 in | Wt 161.0 lb

## 2021-01-18 DIAGNOSIS — M62838 Other muscle spasm: Secondary | ICD-10-CM | POA: Diagnosis not present

## 2021-01-18 DIAGNOSIS — M999 Biomechanical lesion, unspecified: Secondary | ICD-10-CM

## 2021-01-18 DIAGNOSIS — M542 Cervicalgia: Secondary | ICD-10-CM | POA: Diagnosis not present

## 2021-01-18 DIAGNOSIS — M25521 Pain in right elbow: Secondary | ICD-10-CM | POA: Diagnosis not present

## 2021-01-18 DIAGNOSIS — M546 Pain in thoracic spine: Secondary | ICD-10-CM

## 2021-01-18 DIAGNOSIS — G8929 Other chronic pain: Secondary | ICD-10-CM | POA: Diagnosis not present

## 2021-01-18 DIAGNOSIS — M25522 Pain in left elbow: Secondary | ICD-10-CM | POA: Diagnosis not present

## 2021-01-18 DIAGNOSIS — G5620 Lesion of ulnar nerve, unspecified upper limb: Secondary | ICD-10-CM | POA: Diagnosis not present

## 2021-01-18 NOTE — Assessment & Plan Note (Signed)
Patient has had this difficulty previously as well.  Is responding well though to physical therapy.  No need for any significant changes.  If worsening pain would consider the possibility of gabapentin or possible injections also possible nerve conduction study.  Follow-up again in 6 weeks

## 2021-01-18 NOTE — Therapy (Signed)
[place] [place], [place], [address] Phone: [phone]   Fax:  [phone]  Physical Therapy Treatment  Patient Details  Name: Shelly [name] MRN: [id number] Date of Birth: Jan 12, 1961 Referring Provider (PT): Creig Hines   Encounter Date: 01/18/2021   PT End of Session - 01/18/21 1022    Visit Number 3    Number of Visits 25    Date for PT Re-Evaluation 02/25/21    Authorization Type [facility]    PT Start Time 1015    PT Stop Time 1055    PT Time Calculation (min) 40 min    Activity Tolerance Patient tolerated treatment well    Behavior During Therapy [facility] for tasks assessed/performed           Past Medical History:  Diagnosis Date  . Chronic kidney disease    kidney stones  . Depression   . Endometriosis   . Fibroid   . Fracture, tibial plateau   . GERD (gastroesophageal reflux disease)   . Heart murmur   . Hypothyroid   . Inflammatory arthritis 08/28/2016  . Insomnia 08/28/2016  . Migraine   . Osteoarthritis of both feet 08/28/2016  . Osteoarthritis of both hands 08/28/2016  . Osteoarthritis of both knees 08/28/2016  . 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 Sicca ([place]) 08/28/2016  . Vitamin D deficiency 08/28/2016    Past Surgical History:  Procedure Laterality Date  . LAPAROSCOPY  1994   endometriosis  . PELVIC LAPAROSCOPY  1993   d/t endometriosis--Dr. Matthew Saras    There were no vitals filed for this visit.   Subjective Assessment - 01/18/21 1017    Subjective Pt reports she has been doing exercises  and is feeling a bit better, arms not bothering at night when sleeping but still with some numbness in the last 2 fingers on the left. Saw Dr Tamala Julian this morning with no new complaints/issues    Patient Stated Goals have less pain, sleep better    Currently in Pain? Yes    Pain Score 1     Pain Location Hand    Pain Orientation Left    Pain Descriptors / Indicators Numbness    Pain Radiating Towards last  2 fingers                  [facility] Adult PT Treatment/Exercise - 01/18/21 0001      Exercises   Exercises Shoulder;Elbow;Wrist;Hand;Neck      Elbow Exercises   Elbow Extension Strengthening;10 reps;Both   2 sets   Theraband Level (Elbow Extension) Level 1 (Yellow)      Neck Exercises: Theraband   Scapula Retraction 10 reps   BW, 2 sets   Shoulder External Rotation Limitations yellow TB  10x2 ER at neutral      Shoulder Exercises: Supine   Protraction Limitations 2x10 closed chain protraction on wall, Ball on wall CW/CCW 2x 10 B      Wrist Exercises   Wrist Flexion Strengthening;10 reps;Both 2 sets   Bar Weights/Barbell (Wrist Flexion) 2 lbs    Wrist Extension Strengthening;Both;10 reps 2 sets   Bar Weights/Barbell (Wrist Extension) 2 lbs      Manual Therapy   Manual therapy comments STM B Upper traps, LS, medial elbows, periscapular mms.      Neck Exercises: Stretches   Levator Stretch Limitations L & R - 5 x 10 seconds each    Neural Stretch Median N Glide, Median N tensioner 5  x 10" each. Ulnar N glide, Ulnar n floss 5 x 10" each. Bilateral. Ulnar nerve floss in prayer hand position x 10 B.    Other Neck Stretches Rhomboid stretch in sitting 2 x 20 sec                    PT Short Term Goals - 01/18/21 1041      PT SHORT TERM GOAL #1   Title independent with initial HEP    Time 2    Period Weeks    Status Achieved             PT Long Term Goals - 01/18/21 1041      PT LONG TERM GOAL #1   Title independent with advanced HEP    Time 8    Period Weeks    Status On-going      PT LONG TERM GOAL #2   Title understand posture and body mechanics    Time 8    Period Weeks    Status New      PT LONG TERM GOAL #3   Title report 50% less numbness at night    Time 8    Period Weeks    Status Partially Met   No noticeable numbnes at night over the past week     PT LONG TERM GOAL #4   Title report 50% less pain    Time 8    Period Weeks     Status Partially Met                 Plan - 01/18/21 1023    Clinical Impression Statement Pt tolerated todays session nicely. Reports she has been feeling well in the arms  and has not been doing as many home exercises. Reinforced progression of neck and UE neural glides/neurodynamics and strengthening, tolerated increased repetitions well. Session concluded with manual tx . No incr in pain with exercises or manual t/o session   Rehab Potential Good    PT Frequency 1x / week    PT Duration 8 weeks    PT Treatment/Interventions ADLs/Self Care Home Management;Electrical Stimulation;Moist Heat;Ultrasound;Therapeutic activities;Therapeutic exercise;Neuromuscular re-education;Manual techniques;Patient/family education;Dry needling    PT Next Visit Plan start scapular stability, continue with neural stretches, will try DN after the 14th    Consulted and Agree with Plan of Care Patient           Patient will benefit from skilled therapeutic intervention in order to improve the following deficits and impairments:  Decreased range of motion,Increased muscle spasms,Pain,Improper body mechanics,Postural dysfunction,Decreased strength  Visit Diagnosis: Other muscle spasm  Pain in left elbow  Pain in right elbow  Pain in thoracic spine     Problem List Patient Active Problem List   Diagnosis Date Noted  . Anxiety 08/11/2019  . 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 Cubital tunnel syndrome 10/07/2018  . Nonallopathic lesion of rib cage 07/08/2018  . Nonallopathic lesion of sacral region 07/08/2018  . 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 Osteoarthritis of both hands 08/28/2016  . Osteoarthritis of both feet 08/28/2016  . Osteoarthritis of both knees 08/28/2016  . Insomnia 08/28/2016  . 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 Vitamin D deficiency 08/28/2016  . Trapezius muscle spasm 08/28/2016  . Chronic neck pain 03/01/2016  . Scapular dyskinesis 03/01/2016  .  Nonallopathic lesion of cervical region 03/01/2016  . Nonallopathic lesion of thoracic region 03/01/2016  . Nonallopathic lesion of lumbosacral region 03/01/2016  . DUB (  dysfunctional uterine bleeding) 01/05/2014  . Family history of abdominal aortic aneurysm 08/12/2012  . Renal calculus, left 06/04/2012  . Irritability 05/01/2012  . GERD (gastroesophageal reflux disease) 05/01/2012  . History of migraine 05/01/2012  . Hypothyroidism 05/01/2012  . History of renal calculi 05/01/2012  . Leukopenia 05/01/2012  . 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 Allergic rhinitis due to allergen 05/01/2012    Hall Busing, PT, DPT 01/18/2021, 10:59 AM  [place]. [place], [place], [address] Phone: [phone]   Fax:  [phone]  Name: Shelly [name] MRN: [id number] Date of Birth: [birth year]

## 2021-01-18 NOTE — Patient Instructions (Signed)
Good to see you Glad you are doing better See me again as scheduled

## 2021-01-18 NOTE — Assessment & Plan Note (Signed)
Mild increase in neck pain recently.  Chronic problem with mild exacerbation.  Patient has had some mild increase in stress recently with planning daughter's wedding.  Likely contributing to some of it.  Not doing the exercises as regularly.  Discussed icing regimen and home exercises.  Discussed avoiding certain activities.  Follow-up with me again in 4 to 8 weeks

## 2021-01-19 ENCOUNTER — Ambulatory Visit: Payer: 59 | Admitting: Family Medicine

## 2021-01-19 ENCOUNTER — Other Ambulatory Visit: Payer: Self-pay

## 2021-01-19 ENCOUNTER — Encounter: Payer: Self-pay | Admitting: Family Medicine

## 2021-01-19 VITALS — BP 150/88 | HR 88 | Temp 98.2°F | Ht 67.0 in | Wt 163.0 lb

## 2021-01-19 DIAGNOSIS — Z87442 Personal history of urinary calculi: Secondary | ICD-10-CM

## 2021-01-19 DIAGNOSIS — R3129 Other microscopic hematuria: Secondary | ICD-10-CM | POA: Diagnosis not present

## 2021-01-19 DIAGNOSIS — R1032 Left lower quadrant pain: Secondary | ICD-10-CM | POA: Diagnosis not present

## 2021-01-19 LAB — POCT URINALYSIS DIPSTICK
Bilirubin, UA: NEGATIVE
Glucose, UA: NEGATIVE
Ketones, UA: NEGATIVE
Nitrite, UA: NEGATIVE
Protein, UA: NEGATIVE
Spec Grav, UA: 1.02 (ref 1.010–1.025)
Urobilinogen, UA: 0.2 E.U./dL
pH, UA: 6.5 (ref 5.0–8.0)

## 2021-01-19 NOTE — Progress Notes (Signed)
[facility] [address] [place] [address] Dept: [phone] Dept Fax: [phone]  Office Visit  Subjective:    Patient ID: Shelly [name], female    DOB: 03-14-1961, 60 y.o..   MRN: [id number]  Chief Complaint  Patient presents with  . Acute Visit    C/o having burning/pain in the lower abdomen since 01/14/21 when she went to Urgent Care.  Has a HX of kidney stones.     History of Present Illness:  Patient is in today for reassessment after a recent Urgent care visit for left lower quadrant pain. She was found to have microscopic hematuria. Shelly [name] has a history of three prior episodes of kidney stones, the last in 2013. She notes that the pain she is currently experiencing is similar to her last episode. At that point, she retained the stone at the left UVJ for 2 weeks. The urologist was planning a cystoscopy, but she finally passed the stone. She denies any diarrhea or constipation. She did have some scant brownish discharge one day and thought this might be some mild bleeding associated with her having started HRT in Jan. She is takign Tylenol for pain, as she notes she does not tolerate NSAIDs.  Past Medical History: Patient Active Problem List   Diagnosis Date Noted  . History of nephrolithiasis 01/19/2021  . Anxiety 08/11/2019  . 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 Cubital tunnel syndrome 10/07/2018  . Nonallopathic lesion of rib cage 07/08/2018  . Nonallopathic lesion of sacral region 07/08/2018  . Osteoarthritis of both hands 08/28/2016  . Osteoarthritis of both feet 08/28/2016  . Osteoarthritis of both knees 08/28/2016  . 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 Vitamin D deficiency 08/28/2016  . Chronic neck pain 03/01/2016  . Scapular dyskinesis 03/01/2016  . Nonallopathic lesion of cervical region 03/01/2016  . Nonallopathic lesion of thoracic region 03/01/2016  . Nonallopathic lesion of lumbosacral region  03/01/2016  . Family history of abdominal aortic aneurysm 08/12/2012  . Renal calculus, left 06/04/2012  . GERD (gastroesophageal reflux disease) 05/01/2012  . History of migraine 05/01/2012  . Hypothyroidism 05/01/2012  . Leukopenia 05/01/2012  . 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 Allergic rhinitis due to allergen 05/01/2012   Past Surgical History:  Procedure Laterality Date  . LAPAROSCOPY  1994   endometriosis  . PELVIC LAPAROSCOPY  1993   d/t endometriosis--Dr. Matthew Saras   Family History  Problem Relation Age of Onset  . Hypertension Mother   . Dementia Mother   . Hypertension Father   . Stroke Father   . Heart disease Father   . Hypertension Brother   . Hypertension Maternal Grandmother   . Hypertension Maternal Grandfather   . Kidney failure Maternal Grandfather   . Aneurysm Maternal Grandfather   . Cancer Maternal Aunt 42       breast  . Aneurysm Maternal Aunt   . Breast cancer Maternal Aunt   . Cancer Cousin        breast  . Breast cancer Cousin    Outpatient Medications Prior to Visit  Medication Sig Dispense Refill  . baclofen (LIORESAL) 10 MG tablet TAKE 1 TABLET BY MOUTH 3 TIMES DAILY AS NEEDED FOR MUSCLE PAIN 90 tablet 0  . cholecalciferol (VITAMIN D) 1000 units tablet Take 10,000 Units by mouth daily.     Marland Kitchen estradiol (ESTRACE) 0.1 MG/GM vaginal cream Place vaginally.    Marland Kitchen estradiol (VIVELLE-DOT) 0.05 MG/24HR patch 1 patch 2 (two) times a  week.    . hydrOXYzine (ATARAX/VISTARIL) 10 MG tablet Take 1 tablet (10 mg total) by mouth 3 (three) times daily as needed. 30 tablet 0  . levothyroxine (SYNTHROID) 75 MCG tablet Take 1 tablet (75 mcg total) by mouth daily. 90 tablet 3  . Prasterone, DHEA, (DHEA ADVANCED FORMULA PO) Take by mouth.    . progesterone (PROMETRIUM) 100 MG capsule Take by mouth.     No facility-administered medications prior to visit.   Allergies  Allergen Reactions  . Compazine [Prochlorperazine Edisylate] Other (See Comments)     Muscle contractions   Objective:   Today's Vitals   01/19/21 1556  BP: (!) 150/88  Pulse: 88  Temp: 98.2 F (36.8 C)  TempSrc: Temporal  SpO2: 96%  Weight: 163 lb (73.9 kg)  Height: 5\' 7"  (1.702 m)   Body mass index is 25.53 kg/m.   General: Well developed, well nourished. No acute distress. Back: Straight. No CVA tenderness bilaterally. Psych: Alert and oriented. Normal mood and affect.  Health Maintenance Due  Topic Date Due  . Hepatitis C Screening  Never done  . HIV Screening  Never done  . COLONOSCOPY (Pts 45-61yrs Insurance coverage will need to be confirmed)  01/15/2019  . INFLUENZA VACCINE  05/28/2020   Lab Results Urinalysis Component Ref Range & Units 15:52  (01/19/21) 5 d ago  (01/14/21)  Color, UA  yellow  yellow R   Clarity, UA  cloudy  clear R   Glucose, UA Negative Negative  negative R   Bilirubin, UA  negative  negative R   Ketones, UA  negative  negative R   Spec Grav, UA 1.010 - 1.025 1.020  >=1.030Abnormal   Blood, UA  1+  largeAbnormal R   pH, UA 5.0 - 8.0 6.5  5.5   Protein, UA Negative Negative  negative R   Urobilinogen, UA 0.2 or 1.0 E.U./dL 0.2  0.2      Assessment & Plan:   1. Other microscopic hematuria 2. History of nephrolithiasis 3. Left lower quadrant abdominal pain I suspect this is another kidney stone, but would like to get a CT scan to make sure. I recommended Shelly [name] push fluids. She can continue to use Tylenol as needed for pain.  - POCT Urinalysis Dipstick - CT Abdomen Pelvis Wo Contrast; Future  Haydee Salter, MD

## 2021-01-19 NOTE — Patient Instructions (Signed)
Kidney Stones  Kidney stones are solid, rock-like deposits that form inside of the kidneys. The kidneys are a pair of organs that make urine. A kidney stone may form in a kidney and move into other parts of the urinary tract, including the tubes that connect the kidneys to the bladder (ureters), the bladder, and the tube that carries urine out of the body (urethra). As the stone moves through these areas, it can cause intense pain and block the flow of urine. Kidney stones are created when high levels of certain minerals are found in the urine. The stones are usually passed out of the body through urination, but in some cases, medical treatment may be needed to remove them. What are the causes? Kidney stones may be caused by:  A condition in which certain glands produce too much parathyroid hormone (primary hyperparathyroidism), which causes too much calcium buildup in the blood.  A buildup of uric acid crystals in the bladder (hyperuricosuria). Uric acid is a chemical that the body produces when you eat certain foods. It usually exits the body in the urine.  Narrowing (stricture) of one or both of the ureters.  A kidney blockage that is present at birth (congenital obstruction).  Past surgery on the kidney or the ureters, such as gastric bypass surgery. What increases the risk? The following factors may make you more likely to develop this condition:  Having had a kidney stone in the past.  Having a family history of kidney stones.  Not drinking enough water.  Eating a diet that is high in protein, salt (sodium), or sugar.  Being overweight or obese. What are the signs or symptoms? Symptoms of a kidney stone may include:  Pain in the side of the abdomen, right below the ribs (flank pain). Pain usually spreads (radiates) to the groin.  Needing to urinate frequently or urgently.  Painful urination.  Blood in the urine (hematuria).  Nausea.  Vomiting.  Fever and chills. How  is this diagnosed? This condition may be diagnosed based on:  Your symptoms and medical history.  A physical exam.  Blood tests.  Urine tests. These may be done before and after the stone passes out of your body through urination.  Imaging tests, such as a CT scan, abdominal X-ray, or ultrasound.  A procedure to examine the inside of the bladder (cystoscopy). How is this treated? Treatment for kidney stones depends on the size, location, and makeup of the stones. Kidney stones will often pass out of the body through urination. You may need to:  Increase your fluid intake to help pass the stone. In some cases, you may be given fluids through an IV and may need to be monitored at the hospital.  Take medicine for pain.  Make changes in your diet to help prevent kidney stones from coming back. Sometimes, medical procedures are needed to remove a kidney stone. This may involve:  A procedure to break up kidney stones using: ? A focused beam of light (laser therapy). ? Shock waves (extracorporeal shock wave lithotripsy).  Surgery to remove kidney stones. This may be needed if you have severe pain or have stones that block your urinary tract. Follow these instructions at home: Medicines  Take over-the-counter and prescription medicines only as told by your health care provider.  Ask your health care provider if the medicine prescribed to you requires you to avoid driving or using heavy machinery. Eating and drinking  Drink enough fluid to keep your urine pale yellow.   You may be instructed to drink at least 8-10 glasses of water each day. This will help you pass the kidney stone.  If directed, change your diet. This may include: ? Limiting how much sodium you eat. ? Eating more fruits and vegetables. ? Limiting how much animal protein--such as red meat, poultry, fish, and eggs--you eat.  Follow instructions from your health care provider about eating or drinking  restrictions. General instructions  Collect urine samples as told by your health care provider. You may need to collect a urine sample: ? 24 hours after you pass the stone. ? 8-12 weeks after passing the kidney stone, and every 6-12 months after that.  Strain your urine every time you urinate, for as long as directed. Use the strainer that your health care provider recommends.  Do not throw out the kidney stone after passing it. Keep the stone so it can be tested by your health care provider. Testing the makeup of your kidney stone may help prevent you from getting kidney stones in the future.  Keep all follow-up visits as told by your health care provider. This is important. You may need follow-up X-rays or ultrasounds to make sure that your stone has passed. How is this prevented? To prevent another kidney stone:  Drink enough fluid to keep your urine pale yellow. This is the best way to prevent kidney stones.  Eat a healthy diet and follow recommendations from your health care provider about foods to avoid. You may be instructed to eat a low-protein diet. Recommendations vary depending on the type of kidney stone that you have.  Maintain a healthy weight.   Where to find more information  National Kidney Foundation (NKF): www.kidney.org  [employer] (UCF): www.urologyhealth.org Contact a health care provider if:  You have pain that gets worse or does not get better with medicine. Get help right away if:  You have a fever or chills.  You develop severe pain.  You develop new abdominal pain.  You faint.  You are unable to urinate. Summary  Kidney stones are solid, rock-like deposits that form inside of the kidneys.  Kidney stones can cause nausea, vomiting, blood in the urine, abdominal pain, and the urge to urinate frequently.  Treatment for kidney stones depends on the size, location, and makeup of the stones. Kidney stones will often pass out of the body  through urination.  Kidney stones can be prevented by drinking enough fluids, eating a healthy diet, and maintaining a healthy weight. This information is not intended to replace advice given to you by your health care provider. Make sure you discuss any questions you have with your health care provider. Document Revised: 03/02/2019 Document Reviewed: 03/02/2019 Elsevier Patient Education  2021 Elsevier Inc.  

## 2021-01-23 ENCOUNTER — Other Ambulatory Visit: Payer: Self-pay | Admitting: Family Medicine

## 2021-01-24 ENCOUNTER — Ambulatory Visit: Payer: 59

## 2021-01-24 ENCOUNTER — Other Ambulatory Visit: Payer: Self-pay

## 2021-01-24 ENCOUNTER — Ambulatory Visit (HOSPITAL_BASED_OUTPATIENT_CLINIC_OR_DEPARTMENT_OTHER)
Admission: RE | Admit: 2021-01-24 | Discharge: 2021-01-24 | Disposition: A | Discharge status: Home / Self Care | Payer: 59 | Source: Ambulatory Visit | Attending: Family Medicine | Admitting: Family Medicine

## 2021-01-24 DIAGNOSIS — N132 Hydronephrosis with renal and ureteral calculous obstruction: Secondary | ICD-10-CM | POA: Diagnosis not present

## 2021-01-24 DIAGNOSIS — M546 Pain in thoracic spine: Secondary | ICD-10-CM

## 2021-01-24 DIAGNOSIS — I700 Atherosclerosis of aorta: Secondary | ICD-10-CM | POA: Diagnosis not present

## 2021-01-24 DIAGNOSIS — M62838 Other muscle spasm: Secondary | ICD-10-CM

## 2021-01-24 DIAGNOSIS — M25521 Pain in right elbow: Secondary | ICD-10-CM

## 2021-01-24 DIAGNOSIS — R1032 Left lower quadrant pain: Secondary | ICD-10-CM | POA: Insufficient documentation

## 2021-01-24 DIAGNOSIS — M25522 Pain in left elbow: Secondary | ICD-10-CM

## 2021-01-24 NOTE — Therapy (Signed)
[facility] [place], [place], [address] Phone: [phone]   Fax:  [phone]  Physical Therapy Treatment  Patient Details  Name: Shelly [name] MRN: [id number] Date of Birth: 05-22-1961 Referring Provider (PT): Creig Hines   Encounter Date: 01/24/2021   PT End of Session - 01/24/21 0934    Visit Number 4    Number of Visits 25    Date for PT Re-Evaluation 02/25/21    Authorization Type [facility]    PT Start Time 0930    PT Stop Time 1013    PT Time Calculation (min) 43 min    Activity Tolerance Patient tolerated treatment well    Behavior During Therapy [facility] for tasks assessed/performed           Past Medical History:  Diagnosis Date  . Chronic kidney disease    kidney stones  . Depression   . Endometriosis   . Fibroid   . Fracture, tibial plateau   . GERD (gastroesophageal reflux disease)   . Heart murmur   . Hypothyroid   . Inflammatory arthritis 08/28/2016  . Insomnia 08/28/2016  . Migraine   . Osteoarthritis of both feet 08/28/2016  . Osteoarthritis of both hands 08/28/2016  . Osteoarthritis of both knees 08/28/2016  . 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 Sicca ([place]) 08/28/2016  . Tubular adenoma of colon 2015  . Vitamin D deficiency 08/28/2016    Past Surgical History:  Procedure Laterality Date  . LAPAROSCOPY  1994   endometriosis  . PELVIC LAPAROSCOPY  1993   d/t endometriosis--Dr. Matthew Saras    There were no vitals filed for this visit.   Subjective Assessment - 01/24/21 0933    Currently in Pain? Yes    Pain Score 2     Pain Location Hand    Pain Orientation Right    Pain Descriptors / Indicators Numbness                             [facility] Adult PT Treatment/Exercise - 01/24/21 0001      Exercises   Exercises Shoulder;Elbow;Wrist;Hand;Neck      Elbow Exercises   Elbow Extension Strengthening;10 reps;Both   2 sets   Theraband Level (Elbow Extension) Level 1 (Yellow)     Other elbow exercises Pronation/supination with 2# DB x 10,      Neck Exercises: Theraband   Shoulder Extension 10 reps   yellow   Shoulder External Rotation Limitations yellow TB  10x2 ER at neutral    Shoulder Internal Rotation 10 reps   x2, yellow   Horizontal ABduction 10 reps   yellow     Shoulder Exercises: Supine   Protraction Limitations x10 closed chain protraction on wall, Ball on wall CW/CCW x 10 B      Wrist Exercises   Wrist Flexion Strengthening;10 reps;Both    Bar Weights/Barbell (Wrist Flexion) 2 lbs    Wrist Extension Strengthening;Both;10 reps    Bar Weights/Barbell (Wrist Extension) 2 lbs    Wrist Radial Deviation Strengthening;Both;10 reps    Bar Weights/Barbell (Radial Deviation) 2 lbs      Manual Therapy   Manual therapy comments STM B Upper traps, LS, medial/lateral  elbows and forearms, periscapular mms. Bilateral hands with gentle grade 1 mobs carpals/metacarpals, STM at the palms      Neck Exercises: Stretches   Levator Stretch Limitations L & R - 5 x 10 seconds each  Neural Stretch Median N Glide, Median N tensioner 5 x 10" each. Ulnar N glide, Ulnar n floss 5 x 10" each. Bilateral    Other Neck Stretches --                    PT Short Term Goals - 01/18/21 1041      PT SHORT TERM GOAL #1   Title independent with initial HEP    Time 2    Period Weeks    Status Achieved             PT Long Term Goals - 01/18/21 1041      PT LONG TERM GOAL #1   Title independent with advanced HEP    Time 8    Period Weeks    Status On-going      PT LONG TERM GOAL #2   Title understand posture and body mechanics    Time 8    Period Weeks    Status New      PT LONG TERM GOAL #3   Title report 50% less numbness at night    Time 8    Period Weeks    Status Partially Met   No noticeable numbnes at night over the past week     PT LONG TERM GOAL #4   Title report 50% less pain    Time 8    Period Weeks    Status Partially Met                  Plan - 01/24/21 0935    Clinical Impression Statement Pt tolerated todays session nicely. Reports she has been feeling well in the arms, just a little more tingling this past week. Added some pronation/supination mobility/control today with slight increase in burning medial elbow pain on the right.  Reinforced progression of neck and UE neural glides/neurodynamics and strengthening, tolerated increased repetitions well. Session concluded with manual tx tolerated nicely.    Rehab Potential Good    PT Frequency 1x / week    PT Duration 8 weeks    PT Treatment/Interventions ADLs/Self Care Home Management;Electrical Stimulation;Moist Heat;Ultrasound;Therapeutic activities;Therapeutic exercise;Neuromuscular re-education;Manual techniques;Patient/family education;Dry needling    PT Next Visit Plan scapular stability, continue with neural stretches/moblizations/glides, will try DN after the 14th    Consulted and Agree with Plan of Care Patient           Patient will benefit from skilled therapeutic intervention in order to improve the following deficits and impairments:  Decreased range of motion,Increased muscle spasms,Pain,Improper body mechanics,Postural dysfunction,Decreased strength  Visit Diagnosis: Other muscle spasm  Pain in left elbow  Pain in right elbow  Pain in thoracic spine     Problem List Patient Active Problem List   Diagnosis Date Noted  . History of nephrolithiasis 01/19/2021  . Anxiety 08/11/2019  . 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 Cubital tunnel syndrome 10/07/2018  . Nonallopathic lesion of rib cage 07/08/2018  . Nonallopathic lesion of sacral region 07/08/2018  . Osteoarthritis of both hands 08/28/2016  . Osteoarthritis of both feet 08/28/2016  . Osteoarthritis of both knees 08/28/2016  . 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 Vitamin D deficiency 08/28/2016  . Chronic neck pain 03/01/2016  . Scapular dyskinesis 03/01/2016  . Nonallopathic  lesion of cervical region 03/01/2016  . Nonallopathic lesion of thoracic region 03/01/2016  . Nonallopathic lesion of lumbosacral region 03/01/2016  . Family history of abdominal aortic aneurysm 08/12/2012  . Renal calculus, left 06/04/2012  .  GERD (gastroesophageal reflux disease) 05/01/2012  . History of migraine 05/01/2012  . Hypothyroidism 05/01/2012  . Leukopenia 05/01/2012  . 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 Allergic rhinitis due to allergen 05/01/2012    Hall Busing, PT, DPT 01/24/2021, 11:51 AM  [facility] [place], [place], [address] Phone: [phone]   Fax:  [phone]  Name: Shelly [name] MRN: [id number] Date of Birth: 1961-02-09

## 2021-01-25 ENCOUNTER — Other Ambulatory Visit: Payer: Self-pay | Admitting: Gastroenterology

## 2021-01-25 ENCOUNTER — Ambulatory Visit: Payer: 59 | Admitting: Gastroenterology

## 2021-01-25 ENCOUNTER — Encounter: Payer: Self-pay | Admitting: Gastroenterology

## 2021-01-25 ENCOUNTER — Telehealth: Payer: Self-pay | Admitting: Family Medicine

## 2021-01-25 ENCOUNTER — Encounter: Payer: Self-pay | Admitting: Family Medicine

## 2021-01-25 VITALS — BP 140/90 | HR 76 | Ht 67.0 in | Wt 162.0 lb

## 2021-01-25 DIAGNOSIS — I700 Atherosclerosis of aorta: Secondary | ICD-10-CM | POA: Insufficient documentation

## 2021-01-25 DIAGNOSIS — Z86010 Personal history of colonic polyps: Secondary | ICD-10-CM | POA: Diagnosis not present

## 2021-01-25 DIAGNOSIS — N200 Calculus of kidney: Secondary | ICD-10-CM

## 2021-01-25 DIAGNOSIS — K219 Gastro-esophageal reflux disease without esophagitis: Secondary | ICD-10-CM

## 2021-01-25 DIAGNOSIS — R1310 Dysphagia, unspecified: Secondary | ICD-10-CM | POA: Diagnosis not present

## 2021-01-25 MED ORDER — PLENVU 140 G PO SOLR: 1.0000 | Freq: Once | ORAL | 0 refills | Status: DC

## 2021-01-25 MED ORDER — TAMSULOSIN HCL 0.4 MG PO CAPS: 0.4000 mg | ORAL_CAPSULE | Freq: Every day | ORAL | 1 refills | Status: DC

## 2021-01-25 MED FILL — PLENVU 140 GM SOLR: 140 | 1 days supply | Qty: 3 | Fill #0

## 2021-01-25 MED FILL — TAMSULOSIN HCL 0.4 MG CAP: 0.4 | 30 days supply | Qty: 30 | Fill #0

## 2021-01-25 NOTE — Patient Instructions (Signed)
You have been scheduled for an endoscopy and colonoscopy. Please follow the written instructions given to you at your visit today. Please pick up your prep supplies at the pharmacy within the next 1-3 days. If you use inhalers (even only as needed), please bring them with you on the day of your procedure.   Patient advised to avoid spicy, acidic, citrus, chocolate, mints, fruit and fruit juices.  Limit the intake of caffeine, alcohol and Soda.  Don't exercise too soon after eating.  Don't lie down within 3-4 hours of eating.  Elevate the head of your bed.  Thank you for choosing me and [facility].  Pricilla Riffle. Dagoberto Ligas., MD., Marval Regal

## 2021-01-25 NOTE — Telephone Encounter (Signed)
Stone CT  IMPRESSION: Mild-to-moderate left hydroureteronephrosis due to 2-3 calculi in distal left ureter at the UVJ, largest measuring 6 mm.  Tiny right renal calculi, without right-sided ureteral calculi or hydronephrosis.  Aortic Atherosclerosis (ICD10-I70.0).  Called patient and reviewed -Push fluids -Monitor for stone passage -Rx for Flomax -Reviewed precautions for early follow-up or if stone does not pass.

## 2021-01-25 NOTE — Progress Notes (Signed)
History of Present Illness: This is a 60 year old female referred by Ronnald Nian, DO for the evaluation of dysphagia.  She relates solid food dysphagia occurring about 2 times per week since January.  Prior to that it was present but less frequent.  She has heartburn about twice each week.  Previously her heartburn was controlled with dietary measures.  She has a history of an esophageal stricture and underwent EGD with dilation in March 2015.  She also underwent screening colonoscopy in March 2015 showing 1 small adenomatous colon polyp. Denies weight loss, abdominal pain, constipation, diarrhea, change in stool caliber, melena, hematochezia, nausea, vomiting, dysphagia, reflux symptoms, chest pain.     Allergies  Allergen Reactions  . Compazine [Prochlorperazine Edisylate] Other (See Comments)    Muscle contractions   Outpatient Medications Prior to Visit  Medication Sig Dispense Refill  . baclofen (LIORESAL) 10 MG tablet TAKE 1 TABLET BY MOUTH 3 TIMES DAILY AS NEEDED FOR MUSCLE PAIN 90 tablet 0  . cholecalciferol (VITAMIN D) 1000 units tablet Take 10,000 Units by mouth daily.     Marland Kitchen estradiol (VIVELLE-DOT) 0.05 MG/24HR patch 1 patch 2 (two) times a week.    Javier Docker Oil 500 MG CAPS Take 1 capsule by mouth daily.    Marland Kitchen levothyroxine (SYNTHROID) 75 MCG tablet Take 1 tablet (75 mcg total) by mouth daily. 90 tablet 3  . Prasterone, DHEA, (DHEA ADVANCED FORMULA PO) Take by mouth.    . progesterone (PROMETRIUM) 100 MG capsule Take by mouth.    . estradiol (ESTRACE) 0.1 MG/GM vaginal cream Place vaginally. (Patient not taking: Reported on 01/25/2021)    . hydrOXYzine (ATARAX/VISTARIL) 10 MG tablet Take 1 tablet (10 mg total) by mouth 3 (three) times daily as needed. 30 tablet 0   No facility-administered medications prior to visit.   Past Medical History:  Diagnosis Date  . Chronic kidney disease    kidney stones  . Depression   . Endometriosis   . Fibroid   . Fracture, tibial  plateau   . GERD (gastroesophageal reflux disease)   . Heart murmur   . Hypothyroid   . Inflammatory arthritis 08/28/2016  . Insomnia 08/28/2016  . Migraine   . Osteoarthritis of both feet 08/28/2016  . Osteoarthritis of both hands 08/28/2016  . Osteoarthritis of both knees 08/28/2016  . 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 Sicca ([place]) 08/28/2016  . Tubular adenoma of colon 2015  . Vitamin D deficiency 08/28/2016   Past Surgical History:  Procedure Laterality Date  . LAPAROSCOPY  1994   endometriosis  . PELVIC LAPAROSCOPY  1993   d/t endometriosis--Dr. Matthew Saras   Social History   Socioeconomic History  . Marital status: Married    Spouse name: Not on file  . Number of children: 2  . Years of education: Not on file  . Highest education level: Not on file  Occupational History  . Occupation: Programmer, multimedia: [employer]  Tobacco Use  . Smoking status: Never Smoker  . Smokeless tobacco: Never Used  Vaping Use  . Vaping Use: Never used  Substance and Sexual Activity  . Alcohol use: No  . Drug use: No  . Sexual activity: Yes    Partners: Male    Birth control/protection: None  Other Topics Concern  . Not on file  Social History Narrative  . Not on file   Social Determinants of Health   Financial Resource Strain: Not on file  Food Insecurity: Not  on file  Transportation Needs: Not on file  Physical Activity: Not on file  Stress: Not on file  Social Connections: Not on file   Family History  Problem Relation Age of Onset  . Hypertension Mother   . Dementia Mother   . Hypertension Father   . Stroke Father   . Heart disease Father   . Hypertension Brother   . Hypertension Maternal Grandmother   . Hypertension Maternal Grandfather   . Kidney failure Maternal Grandfather   . Aneurysm Maternal Grandfather   . Cancer Maternal Aunt 42       breast  . Aneurysm Maternal Aunt   . Breast cancer Maternal Aunt   . Cancer Cousin        breast  . Breast cancer Cousin        Review of Systems: Pertinent positive and negative review of systems were noted in the above HPI section. All other review of systems were otherwise negative.    Physical Exam: General: Well developed, well nourished, no acute distress Head: Normocephalic and atraumatic Eyes: Sclerae anicteric, EOMI Ears: Normal auditory acuity Mouth: Not examined, mask on during Covid-19 pandemic Neck: Supple, no masses or thyromegaly Lungs: Clear throughout to auscultation Heart: Regular rate and rhythm; no murmurs, rubs or bruits Abdomen: Soft, non tender and non distended. No masses, hepatosplenomegaly or hernias noted. Normal Bowel sounds Rectal: Deferred to colonoscopy  Musculoskeletal: Symmetrical with no gross deformities  Skin: No lesions on visible extremities Pulses:  Normal pulses noted Extremities: No clubbing, cyanosis, edema or deformities noted Neurological: Alert oriented x 4, grossly nonfocal Cervical Nodes:  No significant cervical adenopathy Inguinal Nodes: No significant inguinal adenopathy Psychological:  Alert and cooperative. Normal mood and affect   Assessment and Recommendations:  1. Dysphagia, GERD.  She likely has a recurrent esophageal stricture and she has active reflux symptoms.  Recommend beginning PPI daily therapy however she is very reluctant due to information, likely misinformation, she has read about PPIs.  Offered an H2 blocker as an alternative she declines and wishes to work on dietary measures for taking medications.  We discussed the efficacy safety profiles of PPIs and H2 blockers.  Closely follow antireflux measures.  Schedule EGD. The risks (including bleeding, perforation, infection, missed lesions, medication reactions and possible hospitalization or surgery if complications occur), benefits, and alternatives to endoscopy with possible biopsy and possible dilation were discussed with the patient and they consent to proceed.   2. Personal history of  adenomatous colon polyp in 2015 due for surveillance colonoscopy.  Schedule colonoscopy. The risks (including bleeding, perforation, infection, missed lesions, medication reactions and possible hospitalization or surgery if complications occur), benefits, and alternatives to colonoscopy with possible biopsy and possible polypectomy were discussed with the patient and they consent to proceed.     cc: Ronnald Nian, DO [place],  [place] [address]

## 2021-02-01 ENCOUNTER — Other Ambulatory Visit (HOSPITAL_BASED_OUTPATIENT_CLINIC_OR_DEPARTMENT_OTHER): Payer: Self-pay

## 2021-02-07 ENCOUNTER — Ambulatory Visit: Payer: 59 | Attending: Family Medicine

## 2021-02-07 ENCOUNTER — Other Ambulatory Visit: Payer: Self-pay

## 2021-02-07 DIAGNOSIS — M546 Pain in thoracic spine: Secondary | ICD-10-CM | POA: Insufficient documentation

## 2021-02-07 DIAGNOSIS — M25522 Pain in left elbow: Secondary | ICD-10-CM | POA: Diagnosis not present

## 2021-02-07 DIAGNOSIS — M25521 Pain in right elbow: Secondary | ICD-10-CM | POA: Insufficient documentation

## 2021-02-07 DIAGNOSIS — M62838 Other muscle spasm: Secondary | ICD-10-CM | POA: Diagnosis not present

## 2021-02-07 NOTE — Therapy (Signed)
[place]. [place], [place], [address] Phone: [phone]   Fax:  [phone]  Physical Therapy Treatment  Patient Details  Name: Shelly [name] MRN: [id number] Date of Birth: April 17, 1961 Referring Provider (PT): Creig Hines   Encounter Date: 02/07/2021   PT End of Session - 02/07/21 0844    Visit Number 5    Number of Visits 25    Date for PT Re-Evaluation 02/25/21    Authorization Type [facility]    PT Start Time 0800    PT Stop Time 0843    PT Time Calculation (min) 43 min    Activity Tolerance Patient tolerated treatment well    Behavior During Therapy [facility] for tasks assessed/performed           Past Medical History:  Diagnosis Date  . Chronic kidney disease    kidney stones  . Depression   . Endometriosis   . Fibroid   . Fracture, tibial plateau   . GERD (gastroesophageal reflux disease)   . Heart murmur   . Hypothyroid   . Inflammatory arthritis 08/28/2016  . Insomnia 08/28/2016  . Migraine   . Osteoarthritis of both feet 08/28/2016  . Osteoarthritis of both hands 08/28/2016  . Osteoarthritis of both knees 08/28/2016  . 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 Sicca ([place]) 08/28/2016  . Tubular adenoma of colon 2015  . Vitamin D deficiency 08/28/2016    Past Surgical History:  Procedure Laterality Date  . LAPAROSCOPY  1994   endometriosis  . PELVIC LAPAROSCOPY  1993   d/t endometriosis--Dr. Matthew Saras    There were no vitals filed for this visit.   Subjective Assessment - 02/07/21 0805    Subjective Not doing exercises as often - alot of things going on right now. Had a pin point area of some tenderness/spasm on the middle right upper arm after last session but that went away after about a day    Patient Stated Goals have less pain, sleep better    Currently in Pain? Yes    Pain Score 2     Pain Location Elbow    Pain Orientation Right                             [facility] Adult PT  Treatment/Exercise - 02/07/21 0001      Exercises   Exercises Shoulder;Elbow;Wrist;Hand;Neck      Elbow Exercises   Elbow Extension Strengthening;10 reps;Both   2 sets   Theraband Level (Elbow Extension) Level 2 (Red)    Other elbow exercises Pronation/supination with 2# DB 2x 10,      Neck Exercises: Machines for Strengthening   UBE (Upper Arm Bike) Lvl 1 - 2 min fwd, 2 min bwd      Neck Exercises: Theraband   Shoulder Extension 10 reps   yellow   Shoulder External Rotation Limitations Red TB  10x2 ER at neutral      Shoulder Exercises: Supine   Protraction Limitations x10 closed chain protraction on wall, Ball on wall CW/CCW x 10 B      Shoulder Exercises: Seated   Other Seated Exercises horzontal ABD 10 x  2 red TB      Shoulder Exercises: Pulleys   Other Pulley Exercises 5# D1 extension x 10 B, D2 ext x 10 B. D1 flexion x 10 B D2 flexion x 10 B.      Wrist Exercises  Wrist Flexion Strengthening;10 reps;Both    Bar Weights/Barbell (Wrist Flexion) 3 lbs    Wrist Extension Strengthening;Both;10 reps    Bar Weights/Barbell (Wrist Extension) 3 lbs      Manual Therapy   Manual therapy comments STM B Upper traps, LS, medial/lateral  elbows and forearms, periscapular mms. Bilateral hands with gentle grade 1 mobs carpals/metacarpals, STM at the palms      Neck Exercises: Stretches   Upper Trapezius Stretch Right;Left;5 reps;10 seconds    Levator Stretch Limitations L & R - 5 x 10 seconds each    Neural Stretch Median N Glide, Median N tensioner 5 x 10" each. Ulnar N glide, Ulnar n floss 5 x 10" each. Bilateral                    PT Short Term Goals - 01/18/21 1041      PT SHORT TERM GOAL #1   Title independent with initial HEP    Time 2    Period Weeks    Status Achieved             PT Long Term Goals - 01/18/21 1041      PT LONG TERM GOAL #1   Title independent with advanced HEP    Time 8    Period Weeks    Status On-going      PT LONG TERM GOAL  #2   Title understand posture and body mechanics    Time 8    Period Weeks    Status New      PT LONG TERM GOAL #3   Title report 50% less numbness at night    Time 8    Period Weeks    Status Partially Met   No noticeable numbnes at night over the past week     PT LONG TERM GOAL #4   Title report 50% less pain    Time 8    Period Weeks    Status Partially Met                 Plan - 02/07/21 0845    Clinical Impression Statement Pt tolerated todays session nicely. Elbow pain relatively consistent at a 2, better but not completely gone. Introduced some PNF diagonals for multijoiint strengthening, stability with good tolerance. She had alot of tender areas along the left side of neck, LS, and periscapular region wich responded fairly to [facility] and gentle scap mobs. May benefit from dry needling in subsequent sessions    Rehab Potential Good    PT Frequency 1x / week    PT Duration 8 weeks    PT Treatment/Interventions ADLs/Self Care Home Management;Electrical Stimulation;Moist Heat;Ultrasound;Therapeutic activities;Therapeutic exercise;Neuromuscular re-education;Manual techniques;Patient/family education;Dry needling    PT Next Visit Plan scapular stability, continue with neural stretches/moblizations/glides, try DN    Consulted and Agree with Plan of Care Patient           Patient will benefit from skilled therapeutic intervention in order to improve the following deficits and impairments:  Decreased range of motion,Increased muscle spasms,Pain,Improper body mechanics,Postural dysfunction,Decreased strength  Visit Diagnosis: Other muscle spasm  Pain in left elbow  Pain in right elbow  Pain in thoracic spine     Problem List Patient Active Problem List   Diagnosis Date Noted  . Aortic atherosclerosis ([place]) 01/25/2021  . History of nephrolithiasis 01/19/2021  . Anxiety 08/11/2019  . 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 Cubital tunnel syndrome 10/07/2018  .  Nonallopathic lesion of  rib cage 07/08/2018  . Nonallopathic lesion of sacral region 07/08/2018  . Osteoarthritis of both hands 08/28/2016  . Osteoarthritis of both feet 08/28/2016  . Osteoarthritis of both knees 08/28/2016  . 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 Vitamin D deficiency 08/28/2016  . Chronic neck pain 03/01/2016  . Scapular dyskinesis 03/01/2016  . Nonallopathic lesion of cervical region 03/01/2016  . Nonallopathic lesion of thoracic region 03/01/2016  . Nonallopathic lesion of lumbosacral region 03/01/2016  . Family history of abdominal aortic aneurysm 08/12/2012  . Renal calculus, left 06/04/2012  . GERD (gastroesophageal reflux disease) 05/01/2012  . History of migraine 05/01/2012  . Hypothyroidism 05/01/2012  . Leukopenia 05/01/2012  . 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 Allergic rhinitis due to allergen 05/01/2012    Hall Busing, PT, DPT 02/07/2021, 8:51 AM  [place]. [place], [place], [address] Phone: [phone]   Fax:  [phone]  Name: Shelly [name] MRN: [id number] Date of Birth: 02/12/1961

## 2021-02-11 MED FILL — Levothyroxine Sodium Tab 75 MCG: ORAL | 90 days supply | Qty: 90 | Fill #0 | Status: AC

## 2021-02-11 MED FILL — Estradiol TD Patch Twice Weekly 0.05 MG/24HR: TRANSDERMAL | 28 days supply | Qty: 8 | Fill #0 | Status: AC

## 2021-02-12 ENCOUNTER — Other Ambulatory Visit (HOSPITAL_COMMUNITY): Payer: Self-pay

## 2021-02-12 MED FILL — Progesterone Cap 100 MG: ORAL | 30 days supply | Qty: 30 | Fill #0 | Status: AC

## 2021-02-13 ENCOUNTER — Other Ambulatory Visit: Payer: Self-pay

## 2021-02-13 ENCOUNTER — Encounter: Payer: Self-pay | Admitting: Physical Therapy

## 2021-02-13 ENCOUNTER — Other Ambulatory Visit (HOSPITAL_COMMUNITY): Payer: Self-pay

## 2021-02-13 ENCOUNTER — Ambulatory Visit: Payer: 59 | Admitting: Physical Therapy

## 2021-02-13 DIAGNOSIS — M25521 Pain in right elbow: Secondary | ICD-10-CM

## 2021-02-13 DIAGNOSIS — M62838 Other muscle spasm: Secondary | ICD-10-CM | POA: Diagnosis not present

## 2021-02-13 DIAGNOSIS — M546 Pain in thoracic spine: Secondary | ICD-10-CM | POA: Diagnosis not present

## 2021-02-13 DIAGNOSIS — M25522 Pain in left elbow: Secondary | ICD-10-CM

## 2021-02-13 MED ORDER — CARESTART COVID-19 HOME TEST VI KIT
PACK | 0 refills | Status: DC
  Filled 2021-02-13: qty 2, 4d supply, fill #0

## 2021-02-13 NOTE — Therapy (Signed)
[place]. [place], [place], [address] Phone: [phone]   Fax:  [phone]  Physical Therapy Treatment  Patient Details  Name: Shelly [name] MRN: [id number] Date of Birth: 07-12-1961 Referring Provider (PT): Creig Hines   Encounter Date: 02/13/2021   PT End of Session - 02/13/21 0923    Visit Number 6    Number of Visits 25    Date for PT Re-Evaluation 02/25/21    Authorization Type [facility]    PT Start Time 0840    PT Stop Time 0921    PT Time Calculation (min) 41 min    Activity Tolerance Patient tolerated treatment well    Behavior During Therapy [facility] for tasks assessed/performed           Past Medical History:  Diagnosis Date  . Chronic kidney disease    kidney stones  . Depression   . Endometriosis   . Fibroid   . Fracture, tibial plateau   . GERD (gastroesophageal reflux disease)   . Heart murmur   . Hypothyroid   . Inflammatory arthritis 08/28/2016  . Insomnia 08/28/2016  . Migraine   . Osteoarthritis of both feet 08/28/2016  . Osteoarthritis of both hands 08/28/2016  . Osteoarthritis of both knees 08/28/2016  . 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 Sicca ([place]) 08/28/2016  . Tubular adenoma of colon 2015  . Vitamin D deficiency 08/28/2016    Past Surgical History:  Procedure Laterality Date  . LAPAROSCOPY  1994   endometriosis  . PELVIC LAPAROSCOPY  1993   d/t endometriosis--Dr. Matthew Saras    There were no vitals filed for this visit.   Subjective Assessment - 02/13/21 0839    Subjective Patient reports stress, daughter getting married in two weekends.  Reports pain in the elbows and some in the back, reports that she is having to lift her mom more and more with transfers    Currently in Pain? Yes    Pain Score 2     Pain Location Elbow    Pain Orientation Right;Left    Aggravating Factors  lifting mom, using the arms                             [facility] Adult PT  Treatment/Exercise - 02/13/21 0001      Neck Exercises: Machines for Strengthening   UBE (Upper Arm Bike) Lvl 2 - 41min fwd,3 min bwd      Neck Exercises: Theraband   Shoulder Extension 20 reps;Red    Rows 20 reps;Red    Shoulder External Rotation 20 reps;Red      Neck Exercises: Standing   Other Standing Exercises 5# shrugs with upper trap and levator stretches    Other Standing Exercises 5# bicep curls, green tband triceps extension      Manual Therapy   Manual Therapy Joint mobilization;Soft tissue mobilization;Passive ROM    Joint Mobilization elbow    Soft tissue mobilization forearms and into the upper arms    Passive ROM wrists, elbows and shoulders/pecs                  PT Education - 02/13/21 0901    Education Details patient transfers for her mom    Person(s) Educated Patient    Methods Explanation;Demonstration;Tactile cues;Verbal cues    Comprehension Verbalized understanding;Returned demonstration;Verbal cues required;Tactile cues required            PT  Short Term Goals - 01/18/21 1041      PT SHORT TERM GOAL #1   Title independent with initial HEP    Time 2    Period Weeks    Status Achieved             PT Long Term Goals - 02/13/21 3532      PT LONG TERM GOAL #1   Title independent with advanced HEP    Status Partially Met      PT LONG TERM GOAL #2   Title understand posture and body mechanics    Status Partially Met      PT LONG TERM GOAL #3   Title report 50% less numbness at night    Status Partially Met      PT LONG TERM GOAL #4   Title report 50% less pain    Status Partially Met                 Plan - 02/13/21 0923    Clinical Impression Statement Patient seems to have a lot of tension and guarding, has elevated shoulders and has difficulty relaxing and allowing stretch and STM.  We did look at transfers fro her mom and she thinks this will be helpful as she is reporting strain on the neck, arms and back with this.     PT Next Visit Plan look at the upper traps and neck to stretch and STM, occipital release    Consulted and Agree with Plan of Care Patient           Patient will benefit from skilled therapeutic intervention in order to improve the following deficits and impairments:  Decreased range of motion,Increased muscle spasms,Pain,Improper body mechanics,Postural dysfunction,Decreased strength  Visit Diagnosis: Other muscle spasm  Pain in left elbow  Pain in right elbow  Pain in thoracic spine     Problem List Patient Active Problem List   Diagnosis Date Noted  . Aortic atherosclerosis ([place]) 01/25/2021  . History of nephrolithiasis 01/19/2021  . Anxiety 08/11/2019  . 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 Cubital tunnel syndrome 10/07/2018  . Nonallopathic lesion of rib cage 07/08/2018  . Nonallopathic lesion of sacral region 07/08/2018  . Osteoarthritis of both hands 08/28/2016  . Osteoarthritis of both feet 08/28/2016  . Osteoarthritis of both knees 08/28/2016  . 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 Vitamin D deficiency 08/28/2016  . Chronic neck pain 03/01/2016  . Scapular dyskinesis 03/01/2016  . Nonallopathic lesion of cervical region 03/01/2016  . Nonallopathic lesion of thoracic region 03/01/2016  . Nonallopathic lesion of lumbosacral region 03/01/2016  . Family history of abdominal aortic aneurysm 08/12/2012  . Renal calculus, left 06/04/2012  . GERD (gastroesophageal reflux disease) 05/01/2012  . History of migraine 05/01/2012  . Hypothyroidism 05/01/2012  . Leukopenia 05/01/2012  . 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 Allergic rhinitis due to allergen 05/01/2012    Shelly [name]., PT 02/13/2021, 9:26 AM  [place]. [place], [place], [address] Phone: [phone]   Fax:  [phone]  Name: Shelly [name] MRN: [id number] Date of Birth: December 31, 1960

## 2021-02-14 ENCOUNTER — Other Ambulatory Visit: Payer: Self-pay

## 2021-02-14 ENCOUNTER — Encounter: Payer: Self-pay | Admitting: Family Medicine

## 2021-02-14 ENCOUNTER — Ambulatory Visit (INDEPENDENT_AMBULATORY_CARE_PROVIDER_SITE_OTHER): Payer: 59 | Admitting: Family Medicine

## 2021-02-14 ENCOUNTER — Other Ambulatory Visit (HOSPITAL_COMMUNITY)
Admission: RE | Admit: 2021-02-14 | Discharge: 2021-02-14 | Disposition: A | Discharge status: Home / Self Care | Payer: 59 | Source: Ambulatory Visit | Attending: Family Medicine | Admitting: Family Medicine

## 2021-02-14 VITALS — BP 120/80 | HR 75 | Temp 97.6°F | Ht 65.0 in | Wt 162.0 lb

## 2021-02-14 DIAGNOSIS — Z124 Encounter for screening for malignant neoplasm of cervix: Secondary | ICD-10-CM

## 2021-02-14 DIAGNOSIS — R7301 Impaired fasting glucose: Secondary | ICD-10-CM

## 2021-02-14 DIAGNOSIS — Z23 Encounter for immunization: Secondary | ICD-10-CM | POA: Diagnosis not present

## 2021-02-14 DIAGNOSIS — Z0000 Encounter for general adult medical examination without abnormal findings: Secondary | ICD-10-CM

## 2021-02-14 DIAGNOSIS — Z13220 Encounter for screening for lipoid disorders: Secondary | ICD-10-CM | POA: Diagnosis not present

## 2021-02-14 DIAGNOSIS — E039 Hypothyroidism, unspecified: Secondary | ICD-10-CM

## 2021-02-14 DIAGNOSIS — E559 Vitamin D deficiency, unspecified: Secondary | ICD-10-CM | POA: Diagnosis not present

## 2021-02-14 NOTE — Progress Notes (Signed)
Shelly [name] is a 60 y.o. female  Chief Complaint  Patient presents with  . Annual Exam    Pt here for pap smear/CPE, pt not fasting. Pt UTD on eye exam and she has an appointment scheduled for colosocopy in June.  Pt would like to discuss Zoster vaccine.    HPI: Shelly [name] is a 60 y.o. female seen today for annual CPE, PAP, labs. Pt is not fasting for labs.   Last PAP: today Last mammo: 02/2020 Last colonoscopy: 12/2013 - scheduled on 04/11/21 w/ Dr. Fuller Plan [facility] Last Dexa: 09/2020  Dental: UTD Vision: UTD  Past Medical History:  Diagnosis Date  . Chronic kidney disease    kidney stones  . Depression   . Endometriosis   . Fibroid   . Fracture, tibial plateau   . GERD (gastroesophageal reflux disease)   . Heart murmur   . Hypothyroid   . Inflammatory arthritis 08/28/2016  . Insomnia 08/28/2016  . Migraine   . Osteoarthritis of both feet 08/28/2016  . Osteoarthritis of both hands 08/28/2016  . Osteoarthritis of both knees 08/28/2016  . 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 Sicca ([place]) 08/28/2016  . Tubular adenoma of colon 2015  . Vitamin D deficiency 08/28/2016    Past Surgical History:  Procedure Laterality Date  . LAPAROSCOPY  1994   endometriosis  . PELVIC LAPAROSCOPY  1993   d/t endometriosis--Dr. Matthew Saras    Social History   Socioeconomic History  . Marital status: Married    Spouse name: Not on file  . Number of children: 2  . Years of education: Not on file  . Highest education level: Not on file  Occupational History  . Occupation: Programmer, multimedia: [employer]  Tobacco Use  . Smoking status: Never Smoker  . Smokeless tobacco: Never Used  Vaping Use  . Vaping Use: Never used  Substance and Sexual Activity  . Alcohol use: No  . Drug use: No  . Sexual activity: Yes    Partners: Male    Birth control/protection: None  Other Topics Concern  . Not on file  Social History Narrative  . Not on file   Social Determinants of Health   Financial  Resource Strain: Not on file  Food Insecurity: Not on file  Transportation Needs: Not on file  Physical Activity: Not on file  Stress: Not on file  Social Connections: Not on file  Intimate Partner Violence: Not on file    Family History  Problem Relation Age of Onset  . Hypertension Mother   . Dementia Mother   . Hypertension Father   . Stroke Father   . Heart disease Father   . Hypertension Brother   . Hypertension Maternal Grandmother   . Hypertension Maternal Grandfather   . Kidney failure Maternal Grandfather   . Aneurysm Maternal Grandfather   . Cancer Maternal Aunt 42       breast  . Aneurysm Maternal Aunt   . Breast cancer Maternal Aunt   . Cancer Cousin        breast  . Breast cancer Cousin      Immunization History  Administered Date(s) Administered  . Influenza-Unspecified 07/28/2014, 08/14/2018    Outpatient Encounter Medications as of 02/14/2021  Medication Sig  . baclofen (LIORESAL) 10 MG tablet TAKE 1 TABLET BY MOUTH 3 TIMES DAILY AS NEEDED FOR MUSCLE PAIN  . estradiol (VIVELLE-DOT) 0.05 MG/24HR patch 1 patch 2 (two) times a week.  Astrid Drafts  500 MG CAPS Take 1 capsule by mouth daily.  Marland Kitchen levothyroxine (SYNTHROID) 75 MCG tablet TAKE 1 TABLET BY MOUTH ONCE A DAY  . Prasterone, DHEA, (DHEA ADVANCED FORMULA PO) Take by mouth.  . progesterone (PROMETRIUM) 100 MG capsule Take by mouth.  . tamsulosin (FLOMAX) 0.4 MG CAPS capsule TAKE 1 CAPSULE (0.4 MG TOTAL) BY MOUTH DAILY.  . cholecalciferol (VITAMIN D) 1000 units tablet Take 10,000 Units by mouth daily.   Marland Kitchen COVID-19 At Home Antigen Test [facility] COVID-19 HOME TEST) KIT Use as directed  . estradiol (VIVELLE-DOT) 0.05 MG/24HR patch APPLY 1 PATCH TOPICALLY 2 TIMES A WEEK  . progesterone (PROMETRIUM) 100 MG capsule TAKE 1 CAPSULE BY MOUTH IN THE EVENING ON DAYS 1-26 OF THE CALENDAR MONTH  . [DISCONTINUED] estradiol (ESTRACE) 0.1 MG/GM vaginal cream Place vaginally. (Patient not taking: Reported on 01/25/2021)   . [DISCONTINUED] levothyroxine (SYNTHROID) 75 MCG tablet Take 1 tablet (75 mcg total) by mouth daily.   No facility-administered encounter medications on file as of 02/14/2021.     ROS: Gen: no fever, chills  Skin: no rash, itching ENT: no ear pain, ear drainage, nasal congestion, rhinorrhea, sinus pressure, sore throat Eyes: no blurry vision, double vision Resp: no cough, wheeze,SOB Breast: no breast tenderness, no nipple discharge, no breast masses CV: no CP, palpitations, LE edema,  GI: no heartburn, n/v/d/c, abd pain GU: no dysuria, urgency, frequency, hematuria; no vaginal itching, odor, discharge MSK: no joint pain, myalgias, back pain Neuro: no dizziness, headache, weakness, vertigo Psych: no depression, anxiety, insomnia   Allergies  Allergen Reactions  . Compazine [Prochlorperazine Edisylate] Other (See Comments)    Muscle contractions    BP 120/80 (BP Location: Left Arm, Patient Position: Sitting, Cuff Size: Normal)   Pulse 75   Temp 97.6 F (36.4 C) (Temporal)   Ht _0  (1.651 m)   Wt 162 lb (73.5 kg)   LMP 02/22/2014   SpO2 99%   BMI 26.96 kg/m   Wt Readings from Last 3 Encounters:  02/14/21 162 lb (73.5 kg)  01/25/21 162 lb (73.5 kg)  01/19/21 163 lb (73.9 kg)   Temp Readings from Last 3 Encounters:  02/14/21 97.6 F (36.4 C) (Temporal)  01/19/21 98.2 F (36.8 C) (Temporal)  01/14/21 97.8 F (36.6 C) (Oral)   BP Readings from Last 3 Encounters:  02/14/21 120/80  01/25/21 140/90  01/19/21 (!) 150/88   Pulse Readings from Last 3 Encounters:  02/14/21 75  01/25/21 76  01/19/21 88     Physical Exam Exam conducted with a chaperone present.  Constitutional:      General: She is not in acute distress.    Appearance: She is well-developed.  HENT:     Head: Normocephalic and atraumatic.     Right Ear: Tympanic membrane and ear canal normal.     Left Ear: Tympanic membrane and ear canal normal.     Nose: Nose normal.     Mouth/Throat:      Mouth: Mucous membranes are moist.     Pharynx: Oropharynx is clear.  Eyes:     Conjunctiva/sclera: Conjunctivae normal.  Neck:     Thyroid: No thyromegaly.  Cardiovascular:     Rate and Rhythm: Normal rate and regular rhythm.     Heart sounds: Normal heart sounds. No murmur heard.   Pulmonary:     Effort: Pulmonary effort is normal. No respiratory distress.     Breath sounds: Normal breath sounds. No wheezing or rhonchi.  Abdominal:  General: Bowel sounds are normal. There is no distension.     Palpations: Abdomen is soft. There is no mass.     Tenderness: There is no abdominal tenderness.  Genitourinary:    General: Normal vulva.     Labia:        Right: No rash, tenderness or lesion.        Left: No rash, tenderness or lesion.      Vagina: No vaginal discharge, erythema, tenderness, bleeding or prolapsed vaginal walls.     Cervix: No cervical motion tenderness, discharge, friability, lesion, erythema or cervical bleeding.     Uterus: Not deviated and not enlarged.      Adnexa: Right adnexa normal and left adnexa normal.  Musculoskeletal:     Cervical back: Neck supple.     Right lower leg: No edema.     Left lower leg: No edema.  Lymphadenopathy:     Cervical: No cervical adenopathy.  Skin:    General: Skin is warm and dry.  Neurological:     Mental Status: She is alert and oriented to person, place, and time.     Motor: No abnormal muscle tone.     Coordination: Coordination normal.  Psychiatric:        Behavior: Behavior normal.      A/P:  1. Annual physical exam - discussed importance of regular CV exercise, healthy diet, adequate sleep - UTD on mammo, dexa; PAP today; colo scheduled - UTD on dental and vision - CBC; Future - Basic metabolic panel; Future - AST; Future - ALT; Future - shingrix #1 today, f/u in 2-6 mo for #2 - next CPE in 1 year  2. Vitamin D deficiency - on Vit D 5000U daily - VITAMIN D 25 Hydroxy (Vit-D Deficiency, Fractures);  Future  3. Hypothyroidism, unspecified type - on levothyroxine 4mg daily - TSH; Future - T4, free; Future  4. Screening for cervical cancer - Cytology - PAP( [place])  5. Screening for lipid disorders - Lipid panel; Future  6. IFG (impaired fasting glucose) - Hemoglobin A1c; Future   This visit occurred during the SARS-CoV-2 public health emergency.  Safety protocols were in place, including screening questions prior to the visit, additional usage of staff PPE, and extensive cleaning of exam room while observing appropriate contact time as indicated for disinfecting solutions.

## 2021-02-16 LAB — CYTOLOGY - PAP
Comment: NEGATIVE
Diagnosis: NEGATIVE
High risk HPV: NEGATIVE

## 2021-02-20 ENCOUNTER — Other Ambulatory Visit: Payer: Self-pay

## 2021-02-20 ENCOUNTER — Encounter: Payer: Self-pay | Admitting: Physical Therapy

## 2021-02-20 ENCOUNTER — Ambulatory Visit: Payer: 59 | Admitting: Physical Therapy

## 2021-02-20 DIAGNOSIS — M62838 Other muscle spasm: Secondary | ICD-10-CM

## 2021-02-20 DIAGNOSIS — M25521 Pain in right elbow: Secondary | ICD-10-CM | POA: Diagnosis not present

## 2021-02-20 DIAGNOSIS — M25522 Pain in left elbow: Secondary | ICD-10-CM | POA: Diagnosis not present

## 2021-02-20 DIAGNOSIS — M546 Pain in thoracic spine: Secondary | ICD-10-CM | POA: Diagnosis not present

## 2021-02-20 NOTE — Therapy (Signed)
[place] [place], [place], [address] Phone: [phone]   Fax:  [phone]  Physical Therapy Treatment  Patient Details  Name: Shelly [name] MRN: [id number] Date of Birth: 1961/07/06 Referring Provider (PT): Creig Hines   Encounter Date: 02/20/2021   PT End of Session - 02/20/21 0914    Visit Number 7    Number of Visits 25    Date for PT Re-Evaluation 02/25/21    Authorization Type [facility]    PT Start Time 5638    PT Stop Time 0925    PT Time Calculation (min) 48 min    Activity Tolerance Patient tolerated treatment well    Behavior During Therapy [facility] for tasks assessed/performed           Past Medical History:  Diagnosis Date  . Chronic kidney disease    kidney stones  . Depression   . Endometriosis   . Fibroid   . Fracture, tibial plateau   . GERD (gastroesophageal reflux disease)   . Heart murmur   . Hypothyroid   . Inflammatory arthritis 08/28/2016  . Insomnia 08/28/2016  . Migraine   . Osteoarthritis of both feet 08/28/2016  . Osteoarthritis of both hands 08/28/2016  . Osteoarthritis of both knees 08/28/2016  . 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 Sicca ([place]) 08/28/2016  . Tubular adenoma of colon 2015  . Vitamin D deficiency 08/28/2016    Past Surgical History:  Procedure Laterality Date  . LAPAROSCOPY  1994   endometriosis  . PELVIC LAPAROSCOPY  1993   d/t endometriosis--Dr. Matthew Saras    There were no vitals filed for this visit.   Subjective Assessment - 02/20/21 0910    Subjective Doing well but still a lot of tightness in the right shoulder, neck and upper arm, the wedding is this weekend    Currently in Pain? Yes    Pain Score 3     Pain Location Neck    Pain Orientation Right    Pain Descriptors / Indicators Tightness;Spasm    Aggravating Factors  stress                             [facility] Adult PT Treatment/Exercise - 02/20/21 0001      Modalities   Modalities  Electrical Stimulation;Moist Heat      Moist Heat Therapy   Number Minutes Moist Heat 12 Minutes    Moist Heat Location Cervical      Electrical Stimulation   Electrical Stimulation Location right upper trap and neck area    Electrical Stimulation Action IFC    Electrical Stimulation Parameters sitting    Electrical Stimulation Goals Pain      Manual Therapy   Manual Therapy Manual Traction;Neural Stretch    Soft tissue mobilization forearms and into the upper arms, upper trap and neck    Manual Traction occipital release with cervical stretches    Neural Stretch UE's                    PT Short Term Goals - 01/18/21 1041      PT SHORT TERM GOAL #1   Title independent with initial HEP    Time 2    Period Weeks    Status Achieved             PT Long Term Goals - 02/20/21 0916      PT LONG TERM  GOAL #1   Title independent with advanced HEP    Status Partially Met      PT LONG TERM GOAL #2   Title understand posture and body mechanics    Status Achieved      PT LONG TERM GOAL #3   Title report 50% less numbness at night    Status Partially Met      PT LONG TERM GOAL #4   Title report 50% less pain    Status Partially Met                 Plan - 02/20/21 0915    Clinical Impression Statement Continues to have high levels of stress with taking care of her mom and with her daughters wedding, there are knots in the right upper trap, rhomboid and neck area.  I added STM to this area as well as occipital release and some passive cervical stretches.    PT Next Visit Plan she will have the wedding this weekend, see how this does for her neck pain    Consulted and Agree with Plan of Care Patient           Patient will benefit from skilled therapeutic intervention in order to improve the following deficits and impairments:  Decreased range of motion,Increased muscle spasms,Pain,Improper body mechanics,Postural dysfunction,Decreased strength  Visit  Diagnosis: Other muscle spasm  Pain in left elbow  Pain in right elbow  Pain in thoracic spine     Problem List Patient Active Problem List   Diagnosis Date Noted  . Aortic atherosclerosis ([place]) 01/25/2021  . History of nephrolithiasis 01/19/2021  . Anxiety 08/11/2019  . 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 Cubital tunnel syndrome 10/07/2018  . Nonallopathic lesion of rib cage 07/08/2018  . Nonallopathic lesion of sacral region 07/08/2018  . Osteoarthritis of both hands 08/28/2016  . Osteoarthritis of both feet 08/28/2016  . Osteoarthritis of both knees 08/28/2016  . 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 Vitamin D deficiency 08/28/2016  . Chronic neck pain 03/01/2016  . Scapular dyskinesis 03/01/2016  . Nonallopathic lesion of cervical region 03/01/2016  . Nonallopathic lesion of thoracic region 03/01/2016  . Nonallopathic lesion of lumbosacral region 03/01/2016  . Family history of abdominal aortic aneurysm 08/12/2012  . Renal calculus, left 06/04/2012  . GERD (gastroesophageal reflux disease) 05/01/2012  . History of migraine 05/01/2012  . Hypothyroidism 05/01/2012  . Leukopenia 05/01/2012  . 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 Allergic rhinitis due to allergen 05/01/2012    Sumner Boast., PT 02/20/2021, 9:17 AM  [address] [place], [place], [address] Phone: [phone]   Fax:  [phone]  Name: Shelly [name] MRN: [id number] Date of Birth: 1961/05/23

## 2021-02-27 ENCOUNTER — Ambulatory Visit: Payer: 59 | Admitting: Family Medicine

## 2021-02-27 ENCOUNTER — Other Ambulatory Visit: Payer: Self-pay

## 2021-02-27 ENCOUNTER — Encounter: Payer: Self-pay | Admitting: Family Medicine

## 2021-02-27 VITALS — BP 116/82 | HR 71 | Ht 65.0 in | Wt 163.0 lb

## 2021-02-27 DIAGNOSIS — G2589 Other specified extrapyramidal and movement disorders: Secondary | ICD-10-CM

## 2021-02-27 DIAGNOSIS — M9902 Segmental and somatic dysfunction of thoracic region: Secondary | ICD-10-CM | POA: Diagnosis not present

## 2021-02-27 DIAGNOSIS — M9904 Segmental and somatic dysfunction of sacral region: Secondary | ICD-10-CM | POA: Diagnosis not present

## 2021-02-27 DIAGNOSIS — M9901 Segmental and somatic dysfunction of cervical region: Secondary | ICD-10-CM

## 2021-02-27 DIAGNOSIS — M9908 Segmental and somatic dysfunction of rib cage: Secondary | ICD-10-CM | POA: Diagnosis not present

## 2021-02-27 DIAGNOSIS — M9903 Segmental and somatic dysfunction of lumbar region: Secondary | ICD-10-CM

## 2021-02-27 NOTE — Patient Instructions (Signed)
Good to see you Start planning your vacation Continue PT 1 time a week for another 4-6 weeks Find time for yourself See me again in 6 weeks

## 2021-02-27 NOTE — Progress Notes (Signed)
[facility] [place] [address] [place] Phone: [phone] Subjective:   I Shelly [name] am serving as a Education administrator for Dr. Hulan Saas.  This visit occurred during the SARS-CoV-2 public health emergency.  Safety protocols were in place, including screening questions prior to the visit, additional usage of staff PPE, and extensive cleaning of exam room while observing appropriate contact time as indicated for disinfecting solutions.   I'm seeing this patient by the request  of:  Cirigliano, Garvin Fila, DO  CC: Neck and lower back pain  [id number]  Shelly [name] is a 60 y.o. female coming in with complaint of back and neck pain. OMT 01/18/2021. Patient states she is doing ok. Some pain in the right lower back. She has been taking care of her mom and believes that is why the back may be hurting.  Back pain follow-up patient has been doing physical therapy and has her back pain follow-up on that helpful.  Has had a lot of stressors in her life.  Is planning the wedding recently, taking care of her grandchild, taking care of her elderly mother as well.  Patient is also older child is graduating high school.  Medications patient has been prescribed: None        Past Medical History:  Diagnosis Date  . Chronic kidney disease    kidney stones  . Depression   . Endometriosis   . Fibroid   . Fracture, tibial plateau   . GERD (gastroesophageal reflux disease)   . Heart murmur   . Hypothyroid   . Inflammatory arthritis 08/28/2016  . Insomnia 08/28/2016  . Migraine   . Osteoarthritis of both feet 08/28/2016  . Osteoarthritis of both hands 08/28/2016  . Osteoarthritis of both knees 08/28/2016  . 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 Sicca ([place]) 08/28/2016  . Tubular adenoma of colon 2015  . Vitamin D deficiency 08/28/2016    Allergies  Allergen Reactions  . Compazine [Prochlorperazine Edisylate] Other (See Comments)    Muscle contractions     Review of  Systems:  No headache, visual changes, nausea, vomiting, diarrhea, constipation, dizziness, abdominal pain, skin rash, fevers, chills, night sweats, weight loss, swollen lymph nodes, , joint swelling, chest pain, shortness of breath, mood changes. POSITIVE muscle aches, back body aches  Objective  Blood pressure 116/82, pulse 71, height 5\' 5"  (1.651 m), weight 163 lb (73.9 kg), last menstrual period 02/22/2014, SpO2 100 %.   General: No apparent distress alert and oriented x3 mood and affect normal, dressed appropriately.  Patient does appear mildly stressed HEENT: Pupils equal, extraocular movements intact  Respiratory: Patient's speak in full sentences and does not appear short of breath  Cardiovascular: No lower extremity edema, non tender, no erythema  Patient neck exam does have some tightness noted in the parascapular region right greater than left.  Patient's neck does have tightness noted with sidebending bilaterally.  Low back exam does have tightness noted in the paraspinal musculature right greater than left.  Negative CVA tenderness.  Tightness with straight leg test but no radicular symptoms.  Osteopathic findings  C2 flexed rotated and side bent right C7 flexed rotated and side bent left T5 extended rotated and side bent left with inhaled rib L2 flexed rotated and side bent right L5 flexed rotated and side bent left Sacrum right on right       Assessment and Plan:  Scapular dyskinesis Continued difficulty.  Patient is to continue to work on posture.  Working with physical therapy times a week for the next 4 weeks could be beneficial.  Discussed icing regimen and home exercises, which activities to avoid.  Follow-up with me again in 6 to 8 weeks    Nonallopathic problems  Decision today to treat with OMT was based on Physical Exam  After verbal consent patient was treated with HVLA, ME, FPR techniques in cervical, rib, thoracic, lumbar, and sacral  areas   Patient  tolerated the procedure well with improvement in symptoms  Patient given exercises, stretches and lifestyle modifications  See medications in patient instructions if given  Patient will follow up in 4-8 weeks     The above documentation has been reviewed and is accurate and complete [name], DO       Note: This dictation was prepared with Dragon dictation along with smaller phrase technology. Any transcriptional errors that result from this process are unintentional.

## 2021-02-27 NOTE — Assessment & Plan Note (Signed)
Continued difficulty.  Patient is to continue to work on posture.  Working with physical therapy times a week for the next 4 weeks could be beneficial.  Discussed icing regimen and home exercises, which activities to avoid.  Follow-up with me again in 6 to 8 weeks

## 2021-03-09 ENCOUNTER — Other Ambulatory Visit: Payer: 59

## 2021-03-12 ENCOUNTER — Telehealth: Payer: 59 | Admitting: Family

## 2021-03-12 DIAGNOSIS — U071 COVID-19: Secondary | ICD-10-CM

## 2021-03-12 MED ORDER — FLUTICASONE PROPIONATE 50 MCG/ACT NA SUSP: 2.0000 | Freq: Every day | NASAL | 6 refills | Status: AC

## 2021-03-12 MED ORDER — BENZONATATE 100 MG PO CAPS: 100.0000 mg | ORAL_CAPSULE | Freq: Three times a day (TID) | ORAL | 0 refills | Status: DC | PRN

## 2021-03-12 NOTE — Progress Notes (Signed)
  E-Visit  for Positive Covid Test Result  We are sorry you are not feeling well. We are here to help!  You have tested positive for COVID-19, meaning that you were infected with the novel coronavirus and could give the virus to others.  It is vitally important that you stay home so you do not spread it to others.      Please continue isolation at home, for at least 10 days since the start of your symptoms and until you have had 24 hours with no fever (without taking a fever reducer) and with improving of symptoms.  If you have no symptoms but tested positive (or all symptoms resolve after 5 days and you have no fever) you can leave your house but continue to wear a mask around others for an additional 5 days. If you have a fever,continue to stay home until you have had 24 hours of no fever. Most cases improve 5-10 days from onset but we have seen a small number of patients who have gotten worse after the 10 days.  Please be sure to watch for worsening symptoms and remain taking the proper precautions.   Go to the nearest hospital ED for assessment if fever/cough/breathlessness are severe or illness seems like a threat to life.    The following symptoms may appear 2-14 days after exposure: Fever Cough Shortness of breath or difficulty breathing Chills Repeated shaking with chills Muscle pain Headache Sore throat New loss of taste or smell Fatigue Congestion or runny nose Nausea or vomiting Diarrhea  You have been enrolled in MyChart Home Monitoring for COVID-19. Daily you will receive a questionnaire within the MyChart website. Our COVID-19 response team will be monitoring your responses daily.  You can use medication such as prescription cough medication called Tessalon Perles 100 mg. You may take 1-2 capsules every 8 hours as needed for cough and prescription for Fluticasone nasal spray 2 sprays in each nostril one time per day  You may also take acetaminophen (Tylenol) as needed  for fever.  HOME CARE: Only take medications as instructed by your medical team. Drink plenty of fluids and get plenty of rest. A steam or ultrasonic humidifier can help if you have congestion.   GET HELP RIGHT AWAY IF YOU HAVE EMERGENCY WARNING SIGNS.  Call 911 or proceed to your closest emergency facility if: You develop worsening high fever. Trouble breathing Bluish lips or face Persistent pain or pressure in the chest New confusion Inability to wake or stay awake You cough up blood. Your symptoms become more severe Inability to hold down food or fluids  This list is not all possible symptoms. Contact your medical provider for any symptoms that are severe or concerning to you.    Your e-visit answers were reviewed by a board certified advanced clinical practitioner to complete your personal care plan.  Depending on the condition, your plan could have included both over the counter or prescription medications.  If there is a problem please reply once you have received a response from your provider.  Your safety is important to us.  If you have drug allergies check your prescription carefully.    You can use MyChart to ask questions about today's visit, request a non-urgent call back, or ask for a work or school excuse for 24 hours related to this e-Visit. If it has been greater than 24 hours you will need to follow up with your provider, or enter a new e-Visit to address those   concerns. You will get an e-mail in the next two days asking about your experience.  I hope that your e-visit has been valuable and will speed your recovery. Thank you for using e-visits.   Approximately 5 minutes was spent documenting and reviewing patient's chart.     

## 2021-03-20 MED FILL — Estradiol TD Patch Twice Weekly 0.05 MG/24HR: TRANSDERMAL | 28 days supply | Qty: 8 | Fill #1 | Status: AC

## 2021-03-20 MED FILL — Progesterone Cap 100 MG: ORAL | 30 days supply | Qty: 30 | Fill #1 | Status: AC

## 2021-03-21 ENCOUNTER — Other Ambulatory Visit (HOSPITAL_COMMUNITY): Payer: Self-pay

## 2021-03-22 ENCOUNTER — Other Ambulatory Visit (HOSPITAL_COMMUNITY): Payer: Self-pay

## 2021-04-09 NOTE — Progress Notes (Signed)
Shelly Shelly [name] Phone: [phone] Subjective:   Shelly Shelly [name], am serving as a scribe for Dr. Hulan Saas.  This visit occurred during the SARS-CoV-2 public health emergency.  Safety protocols were in place, including screening questions prior to the visit, additional usage of staff PPE, and extensive cleaning of exam room while observing appropriate contact time as indicated for disinfecting solutions.   I'm seeing this patient by the request  of:  Shelly Shelly [name], Shelly Fila, DO  CC: Back and neck pain follow-up  IOM:[id number]  Shelly Shelly [name] is a 60 y.o. female coming in with complaint of back and neck pain. OMT 02/27/2021. Patient states that her back pain is better. Did have increase in R elbow pain over lateral epicondyle. Pain has improved.   Medications patient has been prescribed: None         Reviewed prior external information including notes and imaging from previsou exam, outside providers and external EMR if available.   As well as notes that were available from care everywhere and other healthcare systems.  Past medical history, social, surgical and family history all reviewed in electronic medical record.  Shelly [name] pertanent information unless stated regarding to the chief complaint.   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 POSITIVE muscle aches  Objective  Blood pressure 134/82, pulse 68, height 5\' 5"  (1.651 m), weight 163 lb (73.9 kg), last menstrual period 02/22/2014, SpO2 99 %.   General: Shelly [name] apparent distress alert and oriented x3 mood and affect normal, dressed appropriately.  HEENT: Pupils equal, extraocular movements intact  Respiratory: Patient's speak in full sentences and does not appear short of breath  Cardiovascular: Shelly [name] lower extremity edema, non tender, Shelly [name] erythema  Gait normal with good balance and coordination.  Patient is some tightness noted in the neck.  Seems to be on the right side.  Negative Spurling's.  Seems to be more in the C5 area.  Tenderness also in the scapular area with mild scapular dyskinesis noted.  Mild tightness in the lower back as well.  Negative straight leg test.  5/5 strength in lower extremities  Osteopathic findings  C6 flexed rotated and side bent right T6 extended rotated and side bent right inhaled rib L2 flexed rotated and side bent right Sacrum right on right       Assessment and Plan:  Chronic neck pain Chronic problem with mild exacerbation.  Patient has responded previously to Relafen.  Patient given a anti-inflammatory.  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  Patient will increase activity slowly.  Patient will follow up with me again 6 weeks otherwise.  Stress is playing the potential role at this point as well.   Nonallopathic problems  Decision today to treat with OMT was based on Physical Exam  After verbal consent patient was treated with HVLA, ME, FPR techniques  in cervical, rib, thoracic, lumbar, and sacral  areas  Patient tolerated the procedure well with improvement in symptoms  Patient given exercises, stretches and lifestyle modifications  See medications in patient instructions if given  Patient will follow up in 6 weeks      The above documentation has been reviewed and is accurate and complete Shelly Pulley, DO       Note: This dictation was prepared with Dragon dictation along with smaller phrase technology. Any transcriptional errors that result from this process are unintentional.

## 2021-04-10 ENCOUNTER — Encounter: Payer: Self-pay | Admitting: Family Medicine

## 2021-04-10 ENCOUNTER — Other Ambulatory Visit (HOSPITAL_COMMUNITY): Payer: Self-pay

## 2021-04-10 ENCOUNTER — Ambulatory Visit: Payer: 59 | Admitting: Family Medicine

## 2021-04-10 ENCOUNTER — Other Ambulatory Visit: Payer: Self-pay

## 2021-04-10 VITALS — BP 134/82 | HR 68 | Ht 65.0 in | Wt 163.0 lb

## 2021-04-10 DIAGNOSIS — M9908 Segmental and somatic dysfunction of rib cage: Secondary | ICD-10-CM | POA: Diagnosis not present

## 2021-04-10 DIAGNOSIS — M9904 Segmental and somatic dysfunction of sacral region: Secondary | ICD-10-CM

## 2021-04-10 DIAGNOSIS — M9902 Segmental and somatic dysfunction of thoracic region: Secondary | ICD-10-CM

## 2021-04-10 DIAGNOSIS — M542 Cervicalgia: Secondary | ICD-10-CM | POA: Diagnosis not present

## 2021-04-10 DIAGNOSIS — M9903 Segmental and somatic dysfunction of lumbar region: Secondary | ICD-10-CM

## 2021-04-10 DIAGNOSIS — G8929 Other chronic pain: Secondary | ICD-10-CM | POA: Diagnosis not present

## 2021-04-10 DIAGNOSIS — M9901 Segmental and somatic dysfunction of cervical region: Secondary | ICD-10-CM

## 2021-04-10 MED ORDER — NABUMETONE 500 MG PO TABS
500.0000 mg | ORAL_TABLET | Freq: Every day | ORAL | 0 refills | Status: AC
  Filled 2021-04-10: qty 90, 90d supply, fill #0

## 2021-04-10 NOTE — Patient Instructions (Signed)
Do not use NSAIDS such as Advil or Aleve when taking Meloxicam It is ok to use Tylenol for additional pain relief Please read Relefen insert See me in 6 weeks

## 2021-04-10 NOTE — Assessment & Plan Note (Signed)
Chronic problem with mild exacerbation.  Patient has responded previously to Relafen.  Patient given a anti-inflammatory.  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  Patient will increase activity slowly.  Patient will follow up with me again 6 weeks otherwise.  Stress is playing the potential role at this point as well.

## 2021-04-11 ENCOUNTER — Other Ambulatory Visit (HOSPITAL_COMMUNITY): Payer: Self-pay

## 2021-04-11 ENCOUNTER — Encounter: Payer: Self-pay | Admitting: Gastroenterology

## 2021-04-11 ENCOUNTER — Ambulatory Visit (AMBULATORY_SURGERY_CENTER): Payer: 59 | Admitting: Gastroenterology

## 2021-04-11 VITALS — BP 97/59 | HR 64 | Temp 98.4°F | Resp 25 | Ht 67.0 in | Wt 162.0 lb

## 2021-04-11 DIAGNOSIS — R1310 Dysphagia, unspecified: Secondary | ICD-10-CM

## 2021-04-11 DIAGNOSIS — Z86010 Personal history of colonic polyps: Secondary | ICD-10-CM

## 2021-04-11 DIAGNOSIS — K219 Gastro-esophageal reflux disease without esophagitis: Secondary | ICD-10-CM | POA: Diagnosis not present

## 2021-04-11 HISTORY — PX: COLONOSCOPY WITH ESOPHAGOGASTRODUODENOSCOPY (EGD): SHX5779

## 2021-04-11 MED ORDER — SODIUM CHLORIDE 0.9 % IV SOLN: 500.0000 mL | Freq: Once | INTRAVENOUS | Status: DC

## 2021-04-11 MED ORDER — FAMOTIDINE 20 MG PO TABS
40.0000 mg | ORAL_TABLET | Freq: Every day | ORAL | 4 refills | Status: DC
  Filled 2021-04-11: qty 90, 45d supply, fill #0
  Filled 2021-06-11: qty 90, 45d supply, fill #1

## 2021-04-11 NOTE — Progress Notes (Signed)
Called to room to assist during endoscopic procedure.  Patient ID and intended procedure confirmed with present staff. Received instructions for my participation in the procedure from the performing physician.  

## 2021-04-11 NOTE — Progress Notes (Signed)
Pt's states no medical or surgical changes since previsit or office visit. 

## 2021-04-11 NOTE — Op Note (Signed)
[place] Patient Name: Shelly [name] Procedure Date: 04/11/2021 1:31 PM MRN: [id number] Endoscopist: Ladene Artist , MD Age: 60 Referring MD:  Date of Birth: 03-25-1961 Gender: Female Account #: [id number] Procedure:                Upper GI endoscopy Indications:              Dysphagia, Gastroesophageal reflux disease Medicines:                Monitored Anesthesia Care Procedure:                Pre-Anesthesia Assessment:                           - Prior to the procedure, a History and Physical                            was performed, and patient medications and                            allergies were reviewed. The patient's tolerance of                            previous anesthesia was also reviewed. The risks                            and benefits of the procedure and the sedation                            options and risks were discussed with the patient.                            All questions were answered, and informed consent                            was obtained. Prior Anticoagulants: The patient has                            taken no previous anticoagulant or antiplatelet                            agents. ASA Grade Assessment: II - A patient with                            mild systemic disease. After reviewing the risks                            and benefits, the patient was deemed in                            satisfactory condition to undergo the procedure.                           After obtaining informed consent, the endoscope was  passed under direct vision. Throughout the                            procedure, the patient's blood pressure, pulse, and                            oxygen saturations were monitored continuously. The                            Endoscope was introduced through the mouth, and                            advanced to the second part of duodenum. The upper                            GI endoscopy  was accomplished without difficulty.                            The patient tolerated the procedure well. Scope In: Scope Out: Findings:                 LA Grade A (one or more mucosal breaks less than 5                            mm, not extending between tops of 2 mucosal folds)                            esophagitis with no bleeding was found at the                            gastroesophageal junction.                           No endoscopic abnormality was evident in the                            esophagus to explain the patient's complaint of                            dysphagia. It was decided, however, to proceed with                            dilation of the entire esophagus. A guidewire was                            placed and the scope was withdrawn. Dilation was                            performed with a Savary dilator with no resistance                            at 17 mm.  Localized moderately erythematous mucosa without                            bleeding was found in the gastric antrum. Biopsies                            were taken with a cold forceps for histology.                           The exam of the stomach was otherwise normal.                           Patchy mildly erythematous mucosa without active                            bleeding and with no stigmata of bleeding was found                            in the duodenal bulb.                           The exam of the duodenum was otherwise normal. Complications:            No immediate complications. Estimated Blood Loss:     Estimated blood loss was minimal. Impression:               - LA Grade A reflux esophagitis with no bleeding.                           - No endoscopic esophageal abnormality to explain                            patient's dysphagia. Esophagus dilated.                           - Erythematous mucosa in the antrum. Biopsied.                           -  Erythematous duodenopathy. Recommendation:           - Patient has a contact number available for                            emergencies. The signs and symptoms of potential                            delayed complications were discussed with the                            patient. Return to normal activities tomorrow.                            Written discharge instructions were provided to the  patient.                           - Resume previous diet.                           - Clear liquid diet for 2 hours, then advance as                            tolerated to soft diet today.                           - Resume prior diet tomorrow.                           - Follow antireflux measures.                           - Continue present medications.                           - Famotidine 40 mg po qd, 1 year of refills.                           - Await pathology results.                           - Return to GI office in 6 weeks. Ladene Artist, MD 04/11/2021 2:07:30 PM This report has been signed electronically.

## 2021-04-11 NOTE — Op Note (Signed)
[place] Patient Name: Shelly [name] Procedure Date: 04/11/2021 1:33 PM MRN: [id number] Endoscopist: Ladene Artist , MD Age: 60 Referring MD:  Date of Birth: 10-May-1961 Gender: Female Account #: [id number] Procedure:                Colonoscopy Indications:              Surveillance: Personal history of adenomatous                            polyps on last colonoscopy > 5 years ago Medicines:                Monitored Anesthesia Care Procedure:                Pre-Anesthesia Assessment:                           - Prior to the procedure, a History and Physical                            was performed, and patient medications and                            allergies were reviewed. The patient's tolerance of                            previous anesthesia was also reviewed. The risks                            and benefits of the procedure and the sedation                            options and risks were discussed with the patient.                            All questions were answered, and informed consent                            was obtained. Prior Anticoagulants: The patient has                            taken no previous anticoagulant or antiplatelet                            agents. ASA Grade Assessment: II - A patient with                            mild systemic disease. After reviewing the risks                            and benefits, the patient was deemed in                            satisfactory condition to undergo the procedure.  After obtaining informed consent, the colonoscope                            was passed under direct vision. Throughout the                            procedure, the patient's blood pressure, pulse, and                            oxygen saturations were monitored continuously. The                            Olympus CF-HQ190L [id number]) Colonoscope was                            introduced through the anus  and advanced to the the                            cecum, identified by appendiceal orifice and                            ileocecal valve. The ileocecal valve, appendiceal                            orifice, and rectum were photographed. The quality                            of the bowel preparation was good. The colonoscopy                            was performed without difficulty. The patient                            tolerated the procedure well. Scope In: 1:38:09 PM Scope Out: 1:52:06 PM Scope Withdrawal Time: 0 hours 11 minutes 14 seconds  Total Procedure Duration: 0 hours 13 minutes 57 seconds  Findings:                 The perianal and digital rectal examinations were                            normal.                           Internal hemorrhoids were found during                            retroflexion. The hemorrhoids were small and Grade                            I (internal hemorrhoids that do not prolapse).                           The exam was otherwise without abnormality on  direct and retroflexion views. Complications:            No immediate complications. Estimated blood loss:                            None. Estimated Blood Loss:     Estimated blood loss: none. Impression:               - Internal hemorrhoids.                           - The examination was otherwise normal on direct                            and retroflexion views.                           - No specimens collected. Recommendation:           - Repeat colonoscopy in 10 years for surveillance.                           - Patient has a contact number available for                            emergencies. The signs and symptoms of potential                            delayed complications were discussed with the                            patient. Return to normal activities tomorrow.                            Written discharge instructions were provided to the                             patient.                           - Resume previous diet.                           - Continue present medications. Ladene Artist, MD 04/11/2021 1:54:18 PM This report has been signed electronically.

## 2021-04-11 NOTE — Progress Notes (Signed)
1309 Robinul 0.1 mg IV given due large amount of secretions upon assessment.  MD made aware, vss

## 2021-04-11 NOTE — Progress Notes (Signed)
Report given to PACU, vss 

## 2021-04-11 NOTE — Patient Instructions (Signed)
Resume previous diet tomorrow Follow post dilation diet today Follow antireflux continue current medications Start famotidine 40mg  by mouth every day Return to GI office in 6 weeks  YOU HAD AN ENDOSCOPIC PROCEDURE TODAY AT [facility]:   Refer to the procedure report that was given to you for any specific questions about what was found during the examination.  If the procedure report does not answer your questions, please call your gastroenterologist to clarify.  If you requested that your care partner not be given the details of your procedure findings, then the procedure report has been included in a sealed envelope for you to review at your convenience later.  YOU SHOULD EXPECT: Some feelings of bloating in the abdomen. Passage of more gas than usual.  Walking can help get rid of the air that was put into your GI tract during the procedure and reduce the bloating. If you had a lower endoscopy (such as a colonoscopy or flexible sigmoidoscopy) you may notice spotting of blood in your stool or on the toilet paper. If you underwent a bowel prep for your procedure, you may not have a normal bowel movement for a few days.  Please Note:  You might notice some irritation and congestion in your nose or some drainage.  This is from the oxygen used during your procedure.  There is no need for concern and it should clear up in a day or so.  SYMPTOMS TO REPORT IMMEDIATELY:  Following lower endoscopy (colonoscopy or flexible sigmoidoscopy):  Excessive amounts of blood in the stool  Significant tenderness or worsening of abdominal pains  Swelling of the abdomen that is new, acute  Fever of 100F or higher  Following upper endoscopy (EGD)  Vomiting of blood or coffee ground material  New chest pain or pain under the shoulder blades  Painful or persistently difficult swallowing  New shortness of breath  Fever of 100F or higher  Black, tarry-looking stools  For urgent or emergent  issues, a gastroenterologist can be reached at any hour by calling [phone]. Do not use MyChart messaging for urgent concerns.   DIET:  We do recommend a small meal at first, but then you may proceed to your regular diet.  Drink plenty of fluids but you should avoid alcoholic beverages for 24 hours.  ACTIVITY:  You should plan to take it easy for the rest of today and you should NOT DRIVE or use heavy machinery until tomorrow (because of the sedation medicines used during the test).    FOLLOW UP: Our staff will call the number listed on your records 48-72 hours following your procedure to check on you and address any questions or concerns that you may have regarding the information given to you following your procedure. If we do not reach you, we will leave a message.  We will attempt to reach you two times.  During this call, we will ask if you have developed any symptoms of COVID 19. If you develop any symptoms (ie: fever, flu-like symptoms, shortness of breath, cough etc.) before then, please call [phone].  If you test positive for Covid 19 in the 2 weeks post procedure, please call and report this information to [place].    If any biopsies were taken you will be contacted by phone or by letter within the next 1-3 weeks.  Please call us at [phone] if you have not heard about the biopsies in 3 weeks.   SIGNATURES/CONFIDENTIALITY: You and/or your care partner  have signed paperwork which will be entered into your electronic medical record.  These signatures attest to the fact that that the information above on your After Visit Summary has been reviewed and is understood.  Full responsibility of the confidentiality of this discharge information lies with you and/or your care-partner.

## 2021-04-12 ENCOUNTER — Other Ambulatory Visit (HOSPITAL_COMMUNITY): Payer: Self-pay

## 2021-04-13 ENCOUNTER — Telehealth: Payer: Self-pay

## 2021-04-13 ENCOUNTER — Other Ambulatory Visit: Payer: Self-pay

## 2021-04-13 NOTE — Telephone Encounter (Signed)
Attempted f/u call. No answer left VM.  

## 2021-04-13 NOTE — Telephone Encounter (Signed)
  Follow up Call-  Call back number 04/11/2021  Post procedure Call Back phone  # [phone]  Permission to leave phone message Yes  Some recent data might be hidden     Patient questions:  Do you have a fever, pain , or abdominal swelling? No. Pain Score  0 *  Have you tolerated food without any problems? Yes.    Have you been able to return to your normal activities? Yes.    Do you have any questions about your discharge instructions: Diet   No. Medications  No. Follow up visit  No.  Do you have questions or concerns about your Care? No.  Actions: * If pain score is 4 or above: No action needed, pain <4.  Have you developed a fever since your procedure? no  2.   Have you had an respiratory symptoms (SOB or cough) since your procedure? no  3.   Have you tested positive for COVID 19 since your procedure no  4.   Have you had any family members/close contacts diagnosed with the COVID 19 since your procedure?  no   If yes to any of these questions please route to Joylene John, RN and Joella Prince, RN

## 2021-04-16 ENCOUNTER — Encounter: Payer: Self-pay | Admitting: Gastroenterology

## 2021-04-17 ENCOUNTER — Other Ambulatory Visit (HOSPITAL_COMMUNITY): Payer: Self-pay

## 2021-04-19 ENCOUNTER — Other Ambulatory Visit: Payer: Self-pay

## 2021-04-19 ENCOUNTER — Other Ambulatory Visit (HOSPITAL_COMMUNITY): Payer: Self-pay

## 2021-04-19 MED ORDER — ESTRADIOL 0.05 MG/24HR TD PTTW
MEDICATED_PATCH | TRANSDERMAL | 5 refills | Status: DC
  Filled 2021-04-19: qty 8, 28d supply, fill #0

## 2021-04-20 ENCOUNTER — Other Ambulatory Visit (HOSPITAL_COMMUNITY): Payer: Self-pay

## 2021-04-24 ENCOUNTER — Other Ambulatory Visit (HOSPITAL_COMMUNITY): Payer: Self-pay

## 2021-04-24 DIAGNOSIS — E039 Hypothyroidism, unspecified: Secondary | ICD-10-CM | POA: Diagnosis not present

## 2021-04-24 DIAGNOSIS — E559 Vitamin D deficiency, unspecified: Secondary | ICD-10-CM | POA: Diagnosis not present

## 2021-04-24 DIAGNOSIS — K219 Gastro-esophageal reflux disease without esophagitis: Secondary | ICD-10-CM | POA: Diagnosis not present

## 2021-04-24 DIAGNOSIS — R7301 Impaired fasting glucose: Secondary | ICD-10-CM | POA: Diagnosis not present

## 2021-05-01 ENCOUNTER — Other Ambulatory Visit (HOSPITAL_COMMUNITY): Payer: Self-pay

## 2021-05-01 ENCOUNTER — Ambulatory Visit (INDEPENDENT_AMBULATORY_CARE_PROVIDER_SITE_OTHER): Payer: 59

## 2021-05-01 ENCOUNTER — Other Ambulatory Visit: Payer: Self-pay

## 2021-05-01 DIAGNOSIS — Z23 Encounter for immunization: Secondary | ICD-10-CM

## 2021-05-01 MED ORDER — PROGESTERONE MICRONIZED 100 MG PO CAPS
100.0000 mg | ORAL_CAPSULE | Freq: Every evening | ORAL | 5 refills | Status: DC
  Filled 2021-05-01 – 2021-05-10 (×2): qty 90, 90d supply, fill #0
  Filled 2021-08-05: qty 90, 90d supply, fill #1
  Filled 2021-11-05: qty 90, 90d supply, fill #2

## 2021-05-01 MED ORDER — LEVOTHYROXINE SODIUM 88 MCG PO TABS
ORAL_TABLET | ORAL | 3 refills | Status: DC
  Filled 2021-05-01: qty 90, 90d supply, fill #0
  Filled 2021-08-05: qty 90, 90d supply, fill #1
  Filled 2021-11-05: qty 90, 90d supply, fill #2
  Filled 2022-02-25: qty 90, 90d supply, fill #3

## 2021-05-01 MED ORDER — ESTRADIOL 0.05 MG/24HR TD PTTW
MEDICATED_PATCH | TRANSDERMAL | 3 refills | Status: DC
  Filled 2021-05-01: qty 24, 84d supply, fill #0
  Filled 2021-08-05: qty 24, 84d supply, fill #1
  Filled 2021-11-05: qty 24, 84d supply, fill #2

## 2021-05-01 NOTE — Progress Notes (Signed)
Per orders of Dr. Letta Median Shingrix vaccine given as 0.21mL IM injection in the right deltoid by Marchia Bond, [place]. Patient tolerated injection well.  No signs or symptoms of a reaction were noted prior to patient leaving the nurse visit.

## 2021-05-09 ENCOUNTER — Other Ambulatory Visit (HOSPITAL_COMMUNITY): Payer: Self-pay

## 2021-05-10 ENCOUNTER — Other Ambulatory Visit (HOSPITAL_COMMUNITY): Payer: Self-pay

## 2021-05-11 ENCOUNTER — Other Ambulatory Visit (HOSPITAL_COMMUNITY): Payer: Self-pay

## 2021-05-15 DIAGNOSIS — K219 Gastro-esophageal reflux disease without esophagitis: Secondary | ICD-10-CM | POA: Diagnosis not present

## 2021-05-15 DIAGNOSIS — R7301 Impaired fasting glucose: Secondary | ICD-10-CM | POA: Diagnosis not present

## 2021-05-15 DIAGNOSIS — E559 Vitamin D deficiency, unspecified: Secondary | ICD-10-CM | POA: Diagnosis not present

## 2021-05-15 DIAGNOSIS — E039 Hypothyroidism, unspecified: Secondary | ICD-10-CM | POA: Diagnosis not present

## 2021-06-01 ENCOUNTER — Ambulatory Visit (INDEPENDENT_AMBULATORY_CARE_PROVIDER_SITE_OTHER): Payer: 59

## 2021-06-01 ENCOUNTER — Other Ambulatory Visit: Payer: Self-pay

## 2021-06-01 ENCOUNTER — Ambulatory Visit: Payer: 59 | Admitting: Family Medicine

## 2021-06-01 VITALS — BP 122/72 | HR 82 | Ht 67.0 in | Wt 163.0 lb

## 2021-06-01 DIAGNOSIS — M9903 Segmental and somatic dysfunction of lumbar region: Secondary | ICD-10-CM

## 2021-06-01 DIAGNOSIS — M542 Cervicalgia: Secondary | ICD-10-CM

## 2021-06-01 DIAGNOSIS — M25562 Pain in left knee: Secondary | ICD-10-CM

## 2021-06-01 DIAGNOSIS — M9908 Segmental and somatic dysfunction of rib cage: Secondary | ICD-10-CM | POA: Diagnosis not present

## 2021-06-01 DIAGNOSIS — M9901 Segmental and somatic dysfunction of cervical region: Secondary | ICD-10-CM | POA: Diagnosis not present

## 2021-06-01 DIAGNOSIS — M9904 Segmental and somatic dysfunction of sacral region: Secondary | ICD-10-CM

## 2021-06-01 DIAGNOSIS — G8929 Other chronic pain: Secondary | ICD-10-CM | POA: Diagnosis not present

## 2021-06-01 DIAGNOSIS — M9902 Segmental and somatic dysfunction of thoracic region: Secondary | ICD-10-CM | POA: Diagnosis not present

## 2021-06-01 NOTE — Assessment & Plan Note (Signed)
Chronic problem with mild exacerbation.  Patient is taking care of her mother who is now on hospice.  Likely contributing to this as well as some underlying anxiety.  Patient is also working with her thyroid but still is not under control.  Patient has been breakthrough.  Discussed icing regimen and home exercises otherwise.  Follow-up again in 4 to 6 weeks

## 2021-06-01 NOTE — Progress Notes (Signed)
[place] Phone: [phone] Subjective:   [name], am serving as a scribe for Dr. Hulan Saas.  This visit occurred during the SARS-CoV-2 public health emergency.  Safety protocols were in place, including screening questions prior to the visit, additional usage of staff PPE, and extensive cleaning of exam room while observing appropriate contact time as indicated for disinfecting solutions.    I'm seeing this patient by the request  of:  Cirigliano, Garvin Fila, DO  CC: Neck pain and new onset left knee pain  [id number]  ZANARI EISENSTEIN is a 60 y.o. female coming in with complaint of back and neck pain. OMT 04/10/2021. Patient states that she is doing ok. Patient has history of L tibia plateau fx, 3 yrs ago, but that she has been having increase in pain with walking. Pain started 2 days ago but has been occurring on and off since the fx.   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 POSITIVE muscle aches  Objective  Blood pressure 122/72, pulse 82, height '5\' 7"'$  (1.702 m), weight 163 lb (73.9 kg), last menstrual period 02/22/2014, SpO2 98 %.   General: No apparent distress alert and oriented x3 mood and affect normal, dressed appropriately.  HEENT: Pupils equal, extraocular movements intact  Respiratory: Patient's speak in full sentences and does not appear short of breath  Cardiovascular: No lower extremity edema, non tender, no erythema  Neck exam does show some mild loss of lordosis.  Some tenderness to palpation of the paraspinal musculature.  Some pain in the scapular region as well and some mild increase in tightness noted of the lower back. Left knee exam shows the patient is tender more over the pes anserine and surely over the joint line.  No significant swelling of the knee noted.  Patient does have mild pain over the medial and lateral joint lines.  No crepitus of the knee.  Good stability of the knee noted.  Osteopathic findings  C2 flexed rotated and side bent right C6 flexed rotated and side bent left T5 extended rotated and side bent right inhaled rib L5 flexed rotated and side bent left  Sacrum right on right       Assessment and Plan:  Chronic neck pain Chronic problem with mild exacerbation.  Patient is taking care of her mother who is now on hospice.  Likely contributing to this as well as some underlying anxiety.  Patient is also working with her thyroid but still is not under control.  Patient has been breakthrough.  Discussed icing regimen and home exercises otherwise.  Follow-up again in 4 to 6 weeks  Left knee pain History of tibial plateau fracture.  Patient is having worsening pain again.  Discussed with patient about icing regimen.  Discussed though that I do feel that more of the pain could be secondary to hamstring pathology.  Patient will do thigh compression sleeve and topical anti-inflammatories x-rays pending  and follow-up again in 4 weeks did not give exercises secondary to patient being more anxious about her mother.   Nonallopathic problems  Decision today to treat with OMT was based on Physical Exam  After verbal consent patient was treated with HVLA, ME, FPR techniques in cervical, rib, thoracic, lumbar, and sacral  areas  Patient tolerated the procedure well with improvement in symptoms  Patient given exercises, stretches and lifestyle modifications  See medications in patient instructions if given  Patient will follow up in 4-8 weeks      The above documentation has been reviewed and is accurate and complete [name], DO       Note: This dictation was prepared with Dragon dictation along with smaller phrase technology. Any transcriptional errors that result from this process are unintentional.

## 2021-06-01 NOTE — Patient Instructions (Signed)
Xray today I think it's more of your hamstring Thigh compression sleeve with activity See me again in 4 weeks

## 2021-06-01 NOTE — Assessment & Plan Note (Signed)
History of tibial plateau fracture.  Patient is having worsening pain again.  Discussed with patient about icing regimen.  Discussed though that I do feel that more of the pain could be secondary to hamstring pathology.  Patient will do thigh compression sleeve and topical anti-inflammatories x-rays pending and follow-up again in 4 weeks did not give exercises secondary to patient being more anxious about her mother.

## 2021-06-06 IMAGING — CT CT RENAL STONE PROTOCOL
2 of 3 series · 17 of 46 positions shown, 19 images · non-contrast
Comparison: 07/09/2012 from [HOSPITAL]

CLINICAL DATA: Left flank and lower abdominal pain. Hematuria.
Nephrolithiasis.

EXAM:
CT ABDOMEN AND PELVIS WITHOUT CONTRAST
TECHNIQUE: Multidetector CT imaging of the abdomen and pelvis was performed
following the standard protocol without IV contrast.

[Series 2: axial st · axial · 0.98mm/px · z∈[-480,-75]mm · 14 of 95 slices shown, 16 images]
[im 7/95  soft-tissue]
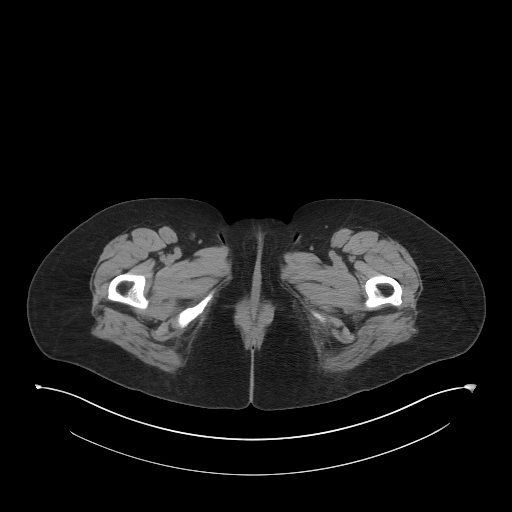
[im 7/95  bone]
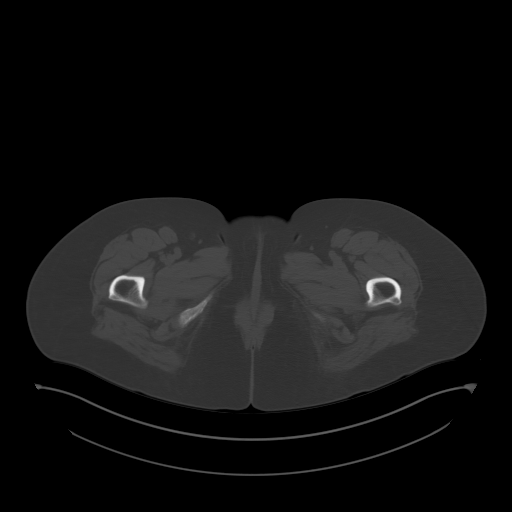
[im 13/95  soft-tissue]
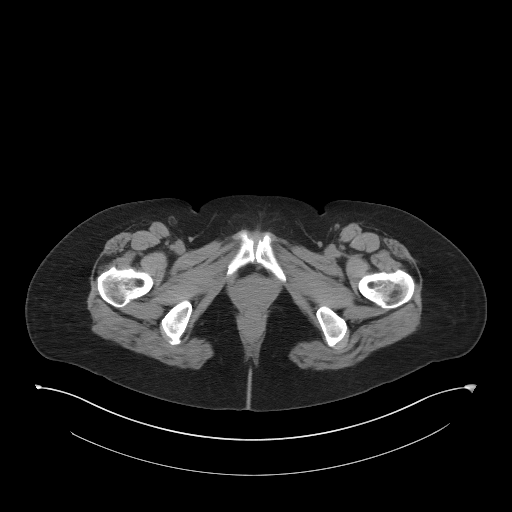
[im 19/95  soft-tissue]
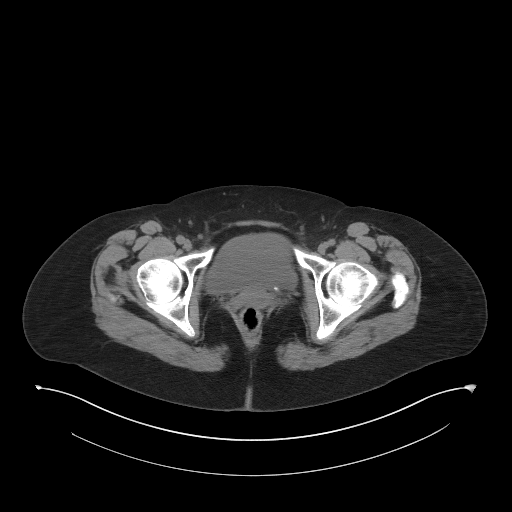
[im 25/95  soft-tissue]
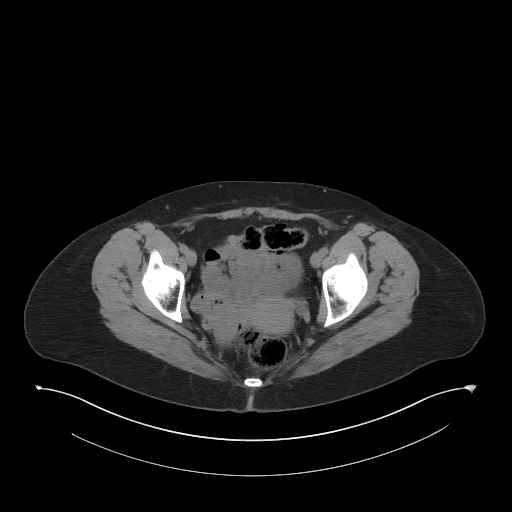
[im 31/95  soft-tissue]
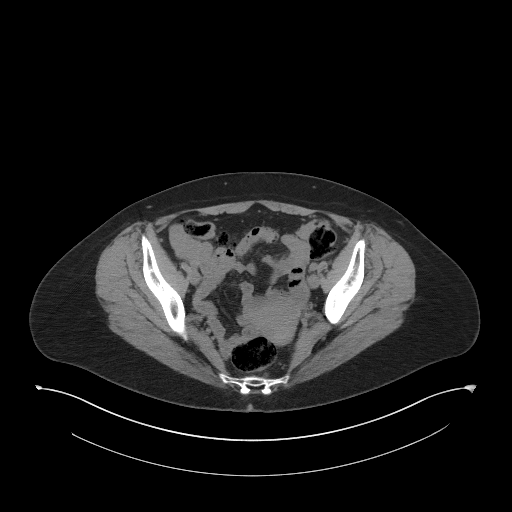
[im 37/95  soft-tissue]
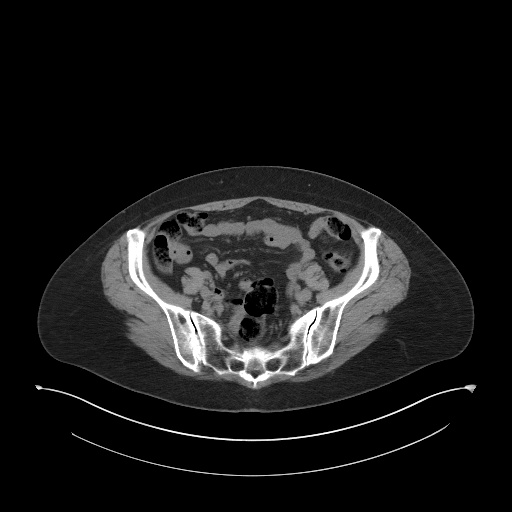
[im 43/95  soft-tissue]
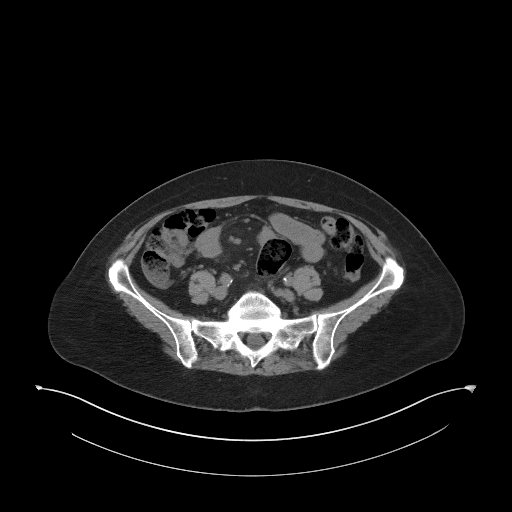
[im 52/95  soft-tissue]
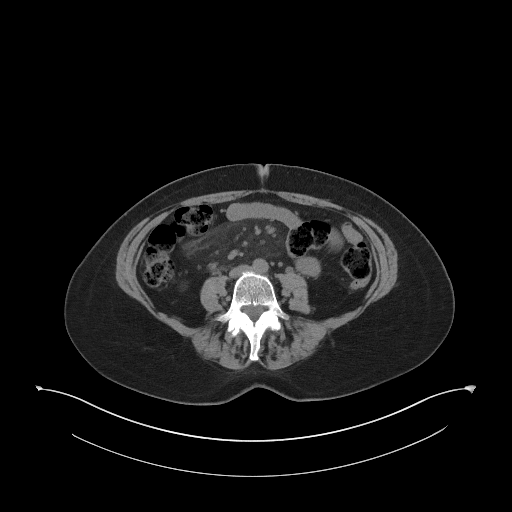
[im 58/95  soft-tissue]
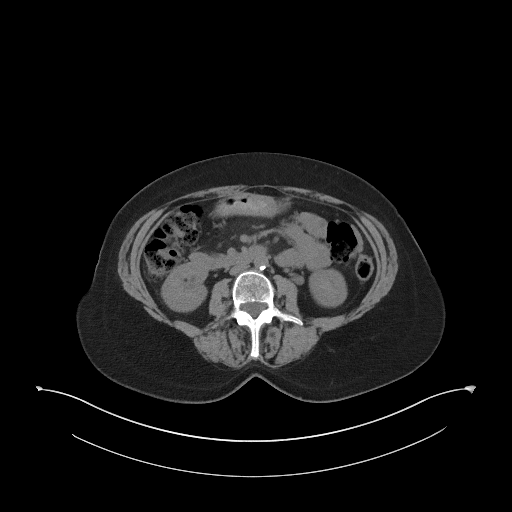
[im 58/95  bone]
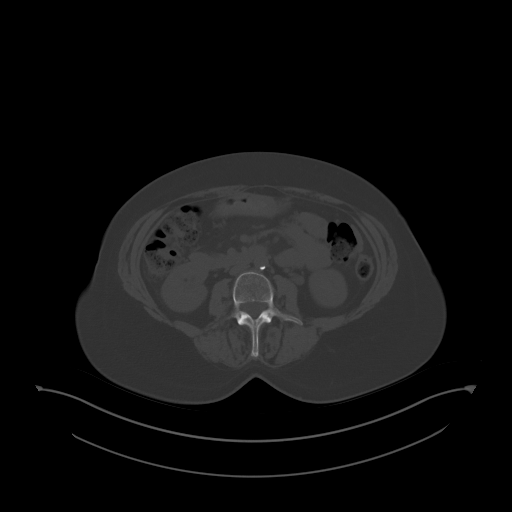
[im 64/95  soft-tissue]
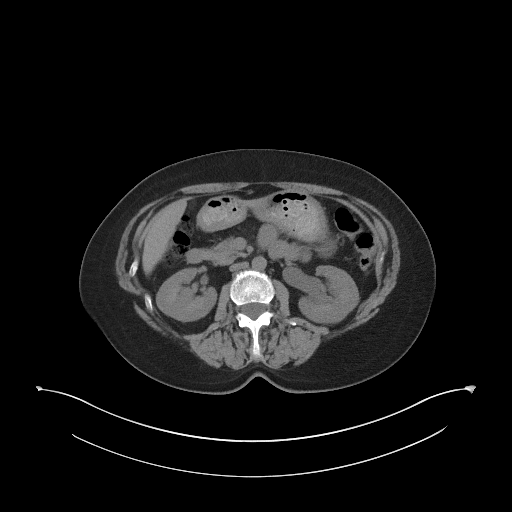
[im 70/95  soft-tissue]
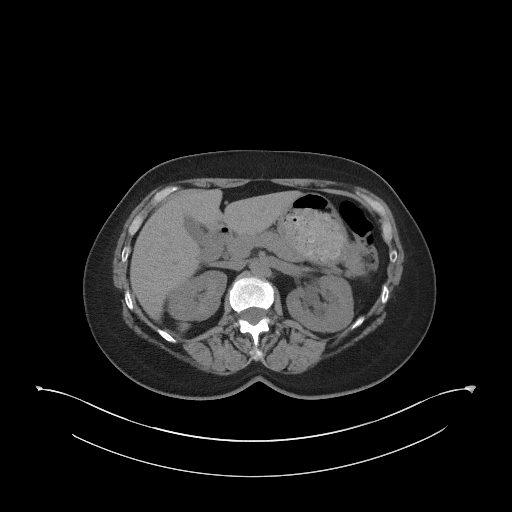
[im 76/95  soft-tissue]
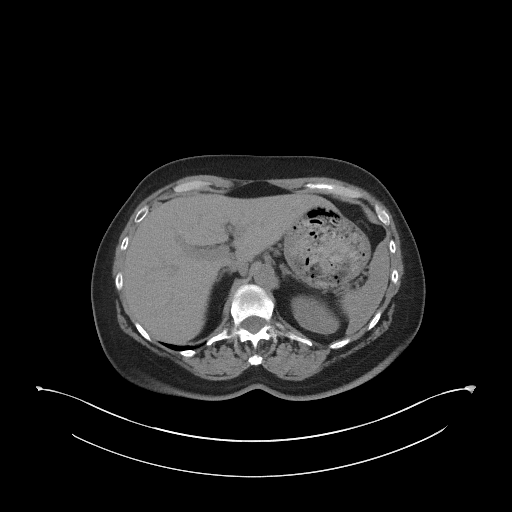
[im 82/95  soft-tissue]
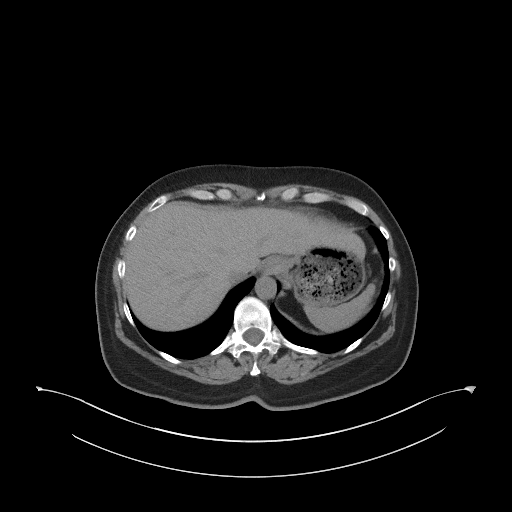
[im 88/95  soft-tissue]
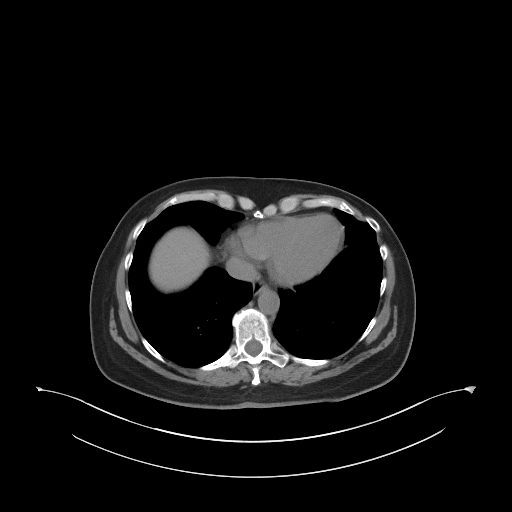

[Series 4: coronal st · coronal · 0.82mm/px · 3 of 95 slices shown]
[im 32/95  soft-tissue]
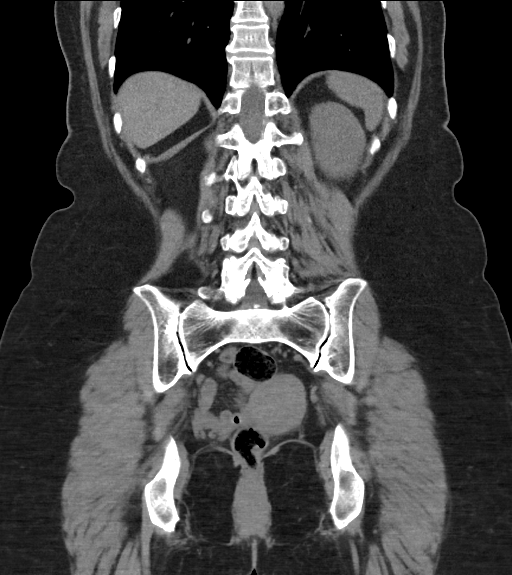
[im 42/95  soft-tissue]
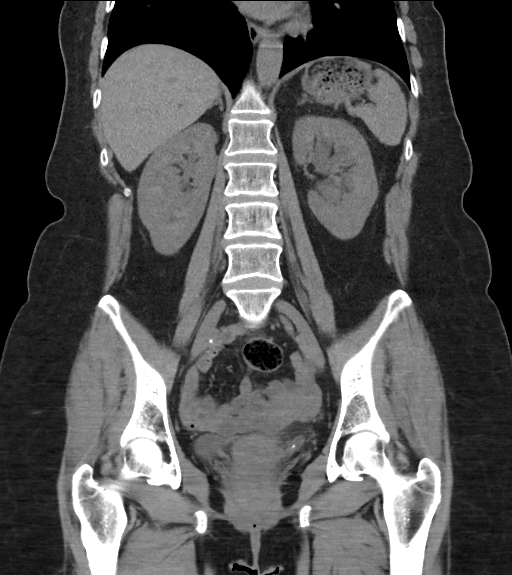
[im 53/95  soft-tissue]
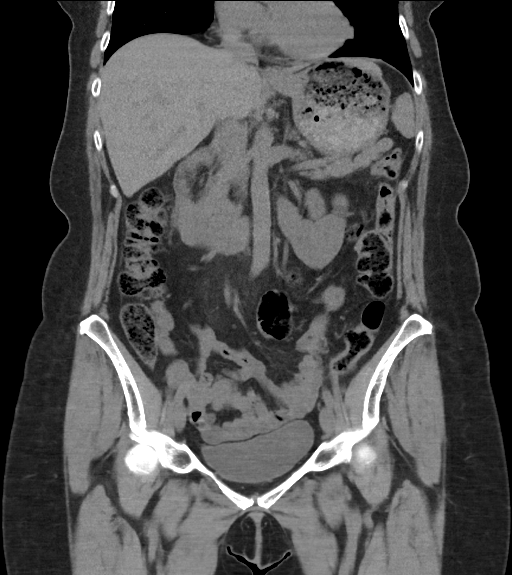

[17 of 46 positions shown; findings below may reference images not displayed]

FINDINGS: Lower chest: No acute findings.

Hepatobiliary: No mass visualized on this unenhanced exam.
Gallbladder is unremarkable. No evidence of biliary ductal
dilatation.

Pancreas: No mass or inflammatory process visualized on this
unenhanced exam.

Spleen:  Within normal limits in size.

Adrenals/Urinary tract: A few tiny 2-3 mm right renal calculi are
seen, however there is no evidence of right-sided
hydroureteronephrosis. Mild-to-moderate left hydroureteronephrosis
is increased since previous study, with 2-3 calculi in the distal
left ureter at the UVJ, largest measuring 6 mm.

Stomach/Bowel: No evidence of obstruction, inflammatory process, or
abnormal fluid collections.

Vascular/Lymphatic: No pathologically enlarged lymph nodes
identified. No evidence of abdominal aortic aneurysm. Aortic
atherosclerotic calcification noted.

Reproductive:  No mass or other significant abnormality.

Other:  None.

Musculoskeletal:  No suspicious bone lesions identified.
IMPRESSION: Mild-to-moderate left hydroureteronephrosis due to 2-3 calculi in
distal left ureter at the UVJ, largest measuring 6 mm.

Tiny right renal calculi, without right-sided ureteral calculi or
hydronephrosis.

Aortic Atherosclerosis ([id number].S).

## 2021-06-11 ENCOUNTER — Other Ambulatory Visit (HOSPITAL_COMMUNITY): Payer: Self-pay

## 2021-06-17 MED FILL — Tamsulosin HCl Cap 0.4 MG: ORAL | 30 days supply | Qty: 30 | Fill #0 | Status: AC

## 2021-06-18 ENCOUNTER — Ambulatory Visit: Payer: 59 | Admitting: Gastroenterology

## 2021-06-18 ENCOUNTER — Other Ambulatory Visit (HOSPITAL_COMMUNITY): Payer: Self-pay

## 2021-06-18 ENCOUNTER — Encounter: Payer: Self-pay | Admitting: Gastroenterology

## 2021-06-18 VITALS — BP 124/78 | HR 72 | Ht 65.0 in | Wt 165.4 lb

## 2021-06-18 DIAGNOSIS — R1310 Dysphagia, unspecified: Secondary | ICD-10-CM

## 2021-06-18 DIAGNOSIS — K2100 Gastro-esophageal reflux disease with esophagitis, without bleeding: Secondary | ICD-10-CM

## 2021-06-18 NOTE — Progress Notes (Signed)
    History of Present Illness: This is a 60 year old female with dysphagia.  She has a history of intermittent solid food dysphagia.  She underwent EGD with empiric dilation as below.  Her dysphagia did not improve.  She states she has been under significant stress including recent death of her mother.  She notes occasional reflux symptoms but generally her reflux symptoms seem adequately controlled on daily famotidine.  EGD 03/2021 - LA Grade A reflux esophagitis with no bleeding. - No endoscopic esophageal abnormality to explain patient's dysphagia. Esophagus dilated. - Erythematous mucosa in the antrum. Biopsied. - Erythematous duodenopathy.  Colonoscopy 03/2021 - Internal hemorrhoids. - The examination was otherwise normal on direct and retroflexion views. - No specimens collected.  Current Medications, Allergies, Past Medical History, Past Surgical History, Family History and Social History were reviewed in [employer] record.   Physical Exam: General: Well developed, well nourished, no acute distress Head: Normocephalic and atraumatic Eyes: Sclerae anicteric, EOMI Ears: Normal auditory acuity Mouth: Not examined, mask on during Covid-19 pandemic Lungs: Clear throughout to auscultation Heart: Regular rate and rhythm; no murmurs, rubs or bruits Abdomen: Soft, non tender and non distended. No masses, hepatosplenomegaly or hernias noted. Normal Bowel sounds Rectal: Not done Musculoskeletal: Symmetrical with no gross deformities  Pulses:  Normal pulses noted Extremities: No clubbing, cyanosis, edema or deformities noted Neurological: Alert oriented x 4, grossly nonfocal Psychological:  Alert and cooperative. Normal mood and affect   Assessment and Recommendations:  GERD, dysphagia.  Persistent solid food dysphagia is her main complaint.  Rule out an esophageal motility disorder, reflux induced symptoms, stricture not noted on EGD that did not respond to  empiric dilation. Schedule barium esophagram with tablet.  Advised her to increase famotidine to twice daily if she reflux symptoms more than 3 times per week.  Pending the results of the barium esophagram we discussed the possibility of taking a PPI for 8 weeks in place of famotidine and or an esophageal manometry study. REV in 6 weeks.  Personal history of adenomatous polyp in the past.  Colonoscopy as above was normal.  Plan for surveillance colonoscopy in June 2032.

## 2021-06-18 NOTE — Patient Instructions (Addendum)
You can increase your famotidine 40 mg to twice daily.  You have been scheduled for a Barium Esophogram at [facility] (1st floor of the hospital) on 06/20/21 at 11:00am. Please arrive 15 minutes prior to your appointment for registration. Make certain not to have anything to eat or drink 3 hours prior to your test. If you need to reschedule for any reason, please contact radiology at [phone] to do so. __________________________________________________________________ A barium swallow is an examination that concentrates on views of the esophagus. This tends to be a double contrast exam (barium and two liquids which, when combined, create a gas to distend the wall of the oesophagus) or single contrast (non-ionic iodine based). The study is usually tailored to your symptoms so a good history is essential. Attention is paid during the study to the form, structure and configuration of the esophagus, looking for functional disorders (such as aspiration, dysphagia, achalasia, motility and reflux) EXAMINATION You may be asked to change into a gown, depending on the type of swallow being performed. A radiologist and radiographer will perform the procedure. The radiologist will advise you of the type of contrast selected for your procedure and direct you during the exam. You will be asked to stand, sit or lie in several different positions and to hold a small amount of fluid in your mouth before being asked to swallow while the imaging is performed .In some instances you may be asked to swallow barium coated marshmallows to assess the motility of a solid food bolus. The exam can be recorded as a digital or video fluoroscopy procedure. POST PROCEDURE It will take 1-2 days for the barium to pass through your system. To facilitate this, it is important, unless otherwise directed, to increase your fluids for the next 24-48hrs and to resume your normal diet.  This test typically takes about 30 minutes to  perform. __________________________________________________________________________________ Due to recent changes in healthcare laws, you may see the results of your imaging and laboratory studies on MyChart before your provider has had a chance to review them.  We understand that in some cases there may be results that are confusing or concerning to you. Not all laboratory results come back in the same time frame and the provider may be waiting for multiple results in order to interpret others.  Please give [place] 48 hours in order for your provider to thoroughly review all the results before contacting the office for clarification of your results.   The [facility] providers would like to encourage you to use [facility] to communicate with providers for non-urgent requests or questions.  Due to long hold times on the telephone, sending your provider a message by [facility] may be a faster and more efficient way to get a response.  Please allow 48 business hours for a response.  Please remember that this is for non-urgent requests.   Thank you for choosing me and [facility].  Pricilla Riffle. Dagoberto Ligas., MD., Marval Regal 06/20/21

## 2021-06-18 NOTE — Telephone Encounter (Signed)
Error

## 2021-06-20 ENCOUNTER — Ambulatory Visit (HOSPITAL_COMMUNITY)
Admission: RE | Admit: 2021-06-20 | Discharge: 2021-06-20 | Disposition: A | Discharge status: Home / Self Care | Payer: 59 | Source: Ambulatory Visit | Attending: Gastroenterology | Admitting: Gastroenterology

## 2021-06-20 ENCOUNTER — Other Ambulatory Visit: Payer: Self-pay

## 2021-06-20 ENCOUNTER — Other Ambulatory Visit (HOSPITAL_COMMUNITY): Payer: Self-pay

## 2021-06-20 DIAGNOSIS — K2100 Gastro-esophageal reflux disease with esophagitis, without bleeding: Secondary | ICD-10-CM | POA: Diagnosis not present

## 2021-06-20 DIAGNOSIS — R1310 Dysphagia, unspecified: Secondary | ICD-10-CM

## 2021-06-20 MED ORDER — PANTOPRAZOLE SODIUM 40 MG PO TBEC
40.0000 mg | DELAYED_RELEASE_TABLET | Freq: Every day | ORAL | 1 refills | Status: DC
  Filled 2021-06-20: qty 30, 30d supply, fill #0
  Filled 2021-08-05: qty 30, 30d supply, fill #1

## 2021-06-20 NOTE — Progress Notes (Signed)
Pantoprazole.

## 2021-06-27 ENCOUNTER — Other Ambulatory Visit: Payer: Self-pay

## 2021-07-03 ENCOUNTER — Ambulatory Visit: Payer: 59 | Admitting: Family Medicine

## 2021-07-03 NOTE — Progress Notes (Signed)
[name] [place] [address] [place] Phone: [phone] Subjective:   Shelly [name], am serving as a scribe for Dr. Hulan Saas. This visit occurred during the SARS-CoV-2 public health emergency.  Safety protocols were in place, including screening questions prior to the visit, additional usage of staff PPE, and extensive cleaning of exam room while observing appropriate contact time as indicated for disinfecting solutions.   CC: Neck pain and back pain follow-up  [id number]  Shelly [name] is a 60 y.o. female coming in with complaint of back and neck pain. OMT on 06/01/2021.  Also saw for left knee pain.  History of tibial plateau fracture.  Patient is having worsening pain again.  Discussed with patient about icing regimen.  Discussed though that I do feel that more of the pain could be secondary to hamstring pathology.  Patient will do thigh compression sleeve and topical anti-inflammatories x-rays pending and follow-up again in 4 weeks did not give exercises secondary to patient being more anxious about her mother.  07/05/2021 Patient states she has back pain but has no other complaints.  Patient states more tightness than anything else.  Patient did have the passing of her mother recently.  No radiation of the pain down the legs.  Patient is well now taking care of her father who recently did have a CHF exacerbation and was hospitalized.  Patient is now home and doing physical therapy.  Again patient has continued to focus on her self.  Medications patient has been prescribed: None  Taking:  X ray FINDINGS: 06/01/2021 Normal alignment. There is mild lateral patellofemoral joint space narrowing. Tibiofemoral joint spaces are preserved. Minimal peripheral patellofemoral spurring and spurring of the tibial spines. No fracture, erosion, or focal bone abnormality. No joint effusion. No focal soft tissue abnormality.   IMPRESSION: Mild  patellofemoral osteoarthritis.        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 POSITIVE muscle aches  Objective  Blood pressure 116/68, pulse 77, height '5\' 5"'$  (1.651 m), weight 166 lb (75.3 kg), last menstrual period 02/22/2014, SpO2 96 %.   General: No apparent distress alert and oriented x3 mood and affect normal, dressed appropriately.  HEENT: Pupils equal, extraocular movements intact  Respiratory: Patient's speak in full sentences and does not appear short of breath  Cardiovascular: No lower extremity edema, non tender, no erythema  Low back exam does have some mild loss of lordosis.  Some tenderness to palpation of the paraspinal musculature.  Tightness with straight leg test right better than  left.  Osteopathic findings  C2 flexed rotated and side bent right C7 flexed rotated and side bent right T3 extended rotated and side bent right inhaled rib T9 extended rotated and side bent left L1 flexed rotated and side bent right Sacrum right on right       Assessment and Plan: Scapular  dyskinesis Continued scapular dyskinesis.  Has had some tightness over the course of time.  Has responded well to osteopathic manipulation.  He does have Relafen.  No recent loss of motor likely contributed to some mild exacerbation.  We will continue to monitor.  Discussed icing regimen and home exercises.  Follow-up with me again 6 to 8 weeks    Nonallopathic problems  Decision today to treat with OMT was based on Physical Exam  After verbal consent patient was treated with HVLA, ME, FPR techniques in cervical, rib, thoracic, lumbar, and sacral  areas  Patient tolerated the procedure well with improvement in symptoms  Patient given exercises, stretches and lifestyle modifications  See medications in patient instructions if given  Patient will follow up in 4-8 weeks      The above documentation has been reviewed and is accurate and complete Shelly Pulley, DO        Note: This dictation was prepared with Dragon dictation along with smaller phrase technology. Any transcriptional errors that result from this process are unintentional.

## 2021-07-05 ENCOUNTER — Encounter: Payer: Self-pay | Admitting: Family Medicine

## 2021-07-05 ENCOUNTER — Ambulatory Visit: Payer: 59 | Admitting: Family Medicine

## 2021-07-05 ENCOUNTER — Other Ambulatory Visit: Payer: Self-pay

## 2021-07-05 VITALS — BP 116/68 | HR 77 | Ht 65.0 in | Wt 166.0 lb

## 2021-07-05 DIAGNOSIS — M9902 Segmental and somatic dysfunction of thoracic region: Secondary | ICD-10-CM

## 2021-07-05 DIAGNOSIS — M9901 Segmental and somatic dysfunction of cervical region: Secondary | ICD-10-CM

## 2021-07-05 DIAGNOSIS — M9908 Segmental and somatic dysfunction of rib cage: Secondary | ICD-10-CM | POA: Diagnosis not present

## 2021-07-05 DIAGNOSIS — M9903 Segmental and somatic dysfunction of lumbar region: Secondary | ICD-10-CM

## 2021-07-05 DIAGNOSIS — G2589 Other specified extrapyramidal and movement disorders: Secondary | ICD-10-CM | POA: Diagnosis not present

## 2021-07-05 DIAGNOSIS — M9904 Segmental and somatic dysfunction of sacral region: Secondary | ICD-10-CM | POA: Diagnosis not present

## 2021-07-05 NOTE — Assessment & Plan Note (Signed)
Continued scapular dyskinesis.  Has had some tightness over the course of time.  Has responded well to osteopathic manipulation.  He does have Relafen.  No recent loss of motor likely contributed to some mild exacerbation.  We will continue to monitor.  Discussed icing regimen and home exercises.  Follow-up with me again 6 to 8 weeks

## 2021-07-05 NOTE — Patient Instructions (Signed)
Sorry for you loss Does seem like we are making progress Find time for yourself See you again in 4-6 weeks

## 2021-08-02 ENCOUNTER — Ambulatory Visit: Payer: 59 | Admitting: Gastroenterology

## 2021-08-03 NOTE — Progress Notes (Signed)
[place] Phone: [phone] Subjective:   [name], am serving as a scribe for Dr. Hulan Saas. This visit occurred during the SARS-CoV-2 public health emergency.  Safety protocols were in place, including screening questions prior to the visit, additional usage of staff PPE, and extensive cleaning of exam room while observing appropriate contact time as indicated for disinfecting solutions.     CC: neck and back pain follow up   [id number]  Shelly [name] is a 60 y.o. female coming in with complaint of back and neck pain. OMT 07/05/2021. Patient states that she is the same as last visit. Not using Relefen all that much.  Patient feels like she has made some progress at the moment.  Pain is not as unrelenting as it was previously.  Medications patient has been prescribed: Relafen  Taking: Intermittently         Reviewed prior external information including notes and imaging from previsou exam, outside providers and external EMR if available.   As well as notes that were available from care everywhere and other healthcare systems.  Past medical history, social, surgical and family history all reviewed in electronic medical record.  No pertanent information unless stated regarding to the chief complaint.   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 POSITIVE muscle aches  Objective  Blood pressure 118/84, pulse 61, height 5\' 5"  (1.651 m), weight 166 lb (75.3 kg), last menstrual period 02/22/2014, SpO2 99 %.   General: No apparent distress alert and oriented x3 mood and affect normal, dressed appropriately.  HEENT: Pupils equal, extraocular movements intact  Respiratory: Patient's speak in full sentences and does not appear short of breath  Cardiovascular: No lower extremity edema, non tender, no erythema  Neck exam does have some mild loss of lordosis.  Tightness noted on both the left side of the lower neck and the upper side of the right her neck.  Patient also has tenderness in the right parascapular region right greater than left  Osteopathic findings  C2 flexed rotated and side bent right C6 flexed rotated and side bent left T3 extended rotated and side bent right inhaled rib T9 extended rotated and side bent left L2 flexed rotated and side bent right Sacrum right on right       Assessment and Plan:  Scapular dyskinesis Scapular dyskinesis patient does have some mild tightness of the neck.  Did respond extremely well to the osteopathic manipulation.  Discussed icing regimen and home exercises.  Increase  activity slowly.  Follow-up again in 6 to 8 weeks.   Nonallopathic problems  Decision today to treat with OMT was based on Physical Exam  After verbal consent patient was treated with HVLA, ME, FPR techniques in cervical, rib, thoracic, lumbar, and sacral  areas  Patient tolerated the procedure well with improvement in symptoms  Patient given exercises, stretches and lifestyle  modifications  See medications in patient instructions if given  Patient will follow up in 4-8 weeks      The above documentation has been reviewed and is accurate and complete [name], DO        Note: This dictation was prepared with Dragon dictation along with smaller phrase technology. Any transcriptional errors that result from this process are unintentional.

## 2021-08-06 ENCOUNTER — Other Ambulatory Visit (HOSPITAL_COMMUNITY): Payer: Self-pay

## 2021-08-06 ENCOUNTER — Other Ambulatory Visit: Payer: Self-pay

## 2021-08-06 ENCOUNTER — Ambulatory Visit: Payer: 59 | Admitting: Family Medicine

## 2021-08-06 VITALS — BP 118/84 | HR 61 | Ht 65.0 in | Wt 166.0 lb

## 2021-08-06 DIAGNOSIS — M9901 Segmental and somatic dysfunction of cervical region: Secondary | ICD-10-CM | POA: Diagnosis not present

## 2021-08-06 DIAGNOSIS — M9908 Segmental and somatic dysfunction of rib cage: Secondary | ICD-10-CM | POA: Diagnosis not present

## 2021-08-06 DIAGNOSIS — M9902 Segmental and somatic dysfunction of thoracic region: Secondary | ICD-10-CM

## 2021-08-06 DIAGNOSIS — G2589 Other specified extrapyramidal and movement disorders: Secondary | ICD-10-CM

## 2021-08-06 DIAGNOSIS — M9904 Segmental and somatic dysfunction of sacral region: Secondary | ICD-10-CM | POA: Diagnosis not present

## 2021-08-06 DIAGNOSIS — M9903 Segmental and somatic dysfunction of lumbar region: Secondary | ICD-10-CM | POA: Diagnosis not present

## 2021-08-06 NOTE — Patient Instructions (Signed)
Neck is better Dr. Sharlet Salina See me in 6-8 weeks

## 2021-08-06 NOTE — Assessment & Plan Note (Signed)
Scapular dyskinesis patient does have some mild tightness of the neck.  Did respond extremely well to the osteopathic manipulation.  Discussed icing regimen and home exercises.  Increase activity slowly.  Follow-up again in 6 to 8 weeks.

## 2021-08-07 ENCOUNTER — Other Ambulatory Visit (HOSPITAL_COMMUNITY): Payer: Self-pay

## 2021-08-14 DIAGNOSIS — E039 Hypothyroidism, unspecified: Secondary | ICD-10-CM | POA: Diagnosis not present

## 2021-08-22 ENCOUNTER — Ambulatory Visit: Payer: 59 | Admitting: Gastroenterology

## 2021-08-22 ENCOUNTER — Encounter: Payer: Self-pay | Admitting: Gastroenterology

## 2021-08-22 VITALS — BP 100/66 | HR 68 | Ht 65.0 in | Wt 167.5 lb

## 2021-08-22 DIAGNOSIS — K2100 Gastro-esophageal reflux disease with esophagitis, without bleeding: Secondary | ICD-10-CM

## 2021-08-22 NOTE — Progress Notes (Signed)
    History of Present Illness: This is a 60 year old female with GERD.  She has not had any recurrent episodes of dysphagia.  Barium esophagram below.  She relates 2 mild and 1 moderate episodes of substernal burning pain when eating since I last saw her.  Barium esophagram 05/2021:  Schatzki's ring is seen the distal esophagus.  Barium tablet passed without delay.  No other abnormality is noted.  Current Medications, Allergies, Past Medical History, Past Surgical History, Family History and Social History were reviewed in [employer] record.   Physical Exam: General: Well developed, well nourished, no acute distress Head: Normocephalic and atraumatic Eyes: Sclerae anicteric, EOMI Ears: Normal auditory acuity Mouth: Not examined, mask on during Covid-19 pandemic Lungs: Clear throughout to auscultation Heart: Regular rate and rhythm; no murmurs, rubs or bruits Abdomen: Soft, non tender and non distended. No masses, hepatosplenomegaly or hernias noted. Normal Bowel sounds Rectal: Not done Musculoskeletal: Symmetrical with no gross deformities  Pulses:  Normal pulses noted Extremities: No clubbing, cyanosis, edema or deformities noted Neurological: Alert oriented x 4, grossly nonfocal Psychological:  Alert and cooperative. Normal mood and affect   Assessment and Recommendations:  GERD with LA Class A esophagitis. Non-obstructing Schatzki's ring on esophagram.  Reflux symptoms under very good control.  Avoid foods that exacerbate symptoms.  Use Tums or Pepcid AC as needed for breakthrough symptoms.  Follow antireflux measures.  Continue pantoprazole 40 mg p.o. daily. REV in 1 year.

## 2021-08-22 NOTE — Patient Instructions (Signed)
Follow up with Dr. Stark in one year.  The [facility] providers would like to encourage you to use MYCHART to communicate with providers for non-urgent requests or questions.  Due to long hold times on the telephone, sending your provider a message by MYCHART may be a faster and more efficient way to get a response.  Please allow 48 business hours for a response.  Please remember that this is for non-urgent requests.   Thank you for choosing me and [facility].  Malcolm T. Stark, Jr., MD., FACG  

## 2021-09-11 ENCOUNTER — Other Ambulatory Visit: Payer: Self-pay | Admitting: Gastroenterology

## 2021-09-11 ENCOUNTER — Other Ambulatory Visit (HOSPITAL_COMMUNITY): Payer: Self-pay

## 2021-09-11 MED ORDER — PANTOPRAZOLE SODIUM 40 MG PO TBEC
40.0000 mg | DELAYED_RELEASE_TABLET | Freq: Every day | ORAL | 11 refills | Status: DC
  Filled 2021-09-11: qty 30, 30d supply, fill #0
  Filled 2021-11-05: qty 90, 90d supply, fill #1
  Filled 2022-02-25: qty 90, 90d supply, fill #2
  Filled 2022-05-22: qty 90, 90d supply, fill #3
  Filled 2022-08-14: qty 60, 60d supply, fill #4

## 2021-09-24 DIAGNOSIS — R1032 Left lower quadrant pain: Secondary | ICD-10-CM | POA: Diagnosis not present

## 2021-09-28 NOTE — Progress Notes (Signed)
Shelly Shelly [name] [place] Phone: [phone] Subjective:   Shelly Shelly [name], am serving as a scribe for Dr. Hulan Saas.  This visit occurred during the SARS-CoV-2 public health emergency.  Safety protocols were in place, including screening questions prior to the visit, additional usage of staff PPE, and extensive cleaning of exam room while observing appropriate contact time as indicated for disinfecting solutions.   I'm seeing this patient by the request  of:  Pcp, Shelly [name]  CC: Neck and back pain follow-up  [id number]  Shelly Shelly [name] is a 60 y.o. female coming in with complaint of back and neck pain. OMT on 08/06/2021. Patient states that she is having R sided lower back pain. States that pain started out as tightness and now is having more spine and bone soreness. Using Relafen and baclofen.   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 POSITIVE muscle aches  Objective   Blood pressure 120/78, pulse 67, height 5\' 5"  (1.651 m), weight 170 lb (77.1 kg), last menstrual period 02/22/2014, SpO2 98 %.   General: Shelly [name] apparent distress alert and oriented x3 mood and affect normal, dressed appropriately.  HEENT: Pupils equal, extraocular movements intact  Respiratory: Patient's speak in full sentences and does not appear short of breath  Cardiovascular: Shelly [name] lower extremity edema, non tender, Shelly [name] erythema  Increased tightness noted of the neck seems to have some limitation in sidebending bilaterally.  Low back exam does have tightness also noted around the sacroiliac joint bilaterally.  Positive Faber left greater than right.  Osteopathic findings  C2 flexed rotated and side bent right C6 flexed rotated and side bent left T3 extended rotated and side bent right inhaled rib T5 extended rotated and side bent left L2 flexed rotated and side bent right Sacrum left on left       Assessment and Plan:  Scapular dyskinesis I believe the patient still has scapular dyskinesis noted.  Discussed icing regimen and home exercises, has not been able to do quite as many exercises recently.  Discussed posture and ergonomics.  Discussed icing regimen.  Follow-up again in 6 weeks   Nonallopathic problems  Decision today to treat with OMT was based on Physical Exam  After verbal consent patient was treated with HVLA, ME, FPR techniques in cervical, rib, thoracic, lumbar, and sacral  areas  Patient tolerated the procedure well with improvement in symptoms  Patient given exercises, stretches and  lifestyle modifications  See medications in patient instructions if given  Patient will follow up in 6 weeks     The above documentation has been reviewed and is accurate and complete Shelly Pulley, DO        Note: This dictation was prepared with Dragon dictation along with smaller phrase technology. Any transcriptional errors that result from this process are  unintentional.

## 2021-10-01 ENCOUNTER — Other Ambulatory Visit: Payer: Self-pay

## 2021-10-01 ENCOUNTER — Encounter: Payer: Self-pay | Admitting: Family Medicine

## 2021-10-01 ENCOUNTER — Ambulatory Visit: Payer: 59 | Admitting: Family Medicine

## 2021-10-01 VITALS — BP 120/78 | HR 67 | Ht 65.0 in | Wt 170.0 lb

## 2021-10-01 DIAGNOSIS — M9902 Segmental and somatic dysfunction of thoracic region: Secondary | ICD-10-CM

## 2021-10-01 DIAGNOSIS — M9903 Segmental and somatic dysfunction of lumbar region: Secondary | ICD-10-CM

## 2021-10-01 DIAGNOSIS — G2589 Other specified extrapyramidal and movement disorders: Secondary | ICD-10-CM

## 2021-10-01 DIAGNOSIS — M9904 Segmental and somatic dysfunction of sacral region: Secondary | ICD-10-CM

## 2021-10-01 DIAGNOSIS — M9908 Segmental and somatic dysfunction of rib cage: Secondary | ICD-10-CM

## 2021-10-01 DIAGNOSIS — M9901 Segmental and somatic dysfunction of cervical region: Secondary | ICD-10-CM | POA: Diagnosis not present

## 2021-10-01 NOTE — Patient Instructions (Signed)
Great to see you Have great holiday season Take relafen and baclofen for next 3 days See me again in 6-8 weeks

## 2021-10-02 ENCOUNTER — Encounter: Payer: Self-pay | Admitting: Family Medicine

## 2021-10-02 NOTE — Assessment & Plan Note (Signed)
I believe the patient still has scapular dyskinesis noted.  Discussed icing regimen and home exercises, has not been able to do quite as many exercises recently.  Discussed posture and ergonomics.  Discussed icing regimen.  Follow-up again in 6 weeks

## 2021-10-04 DIAGNOSIS — Z87442 Personal history of urinary calculi: Secondary | ICD-10-CM | POA: Diagnosis not present

## 2021-10-04 DIAGNOSIS — N130 Hydronephrosis with ureteropelvic junction obstruction: Secondary | ICD-10-CM | POA: Diagnosis not present

## 2021-10-04 DIAGNOSIS — R1032 Left lower quadrant pain: Secondary | ICD-10-CM | POA: Diagnosis not present

## 2021-10-12 IMAGING — DX DG KNEE AP/LAT W/ SUNRISE*L*
3 series · 3 of 3 positions shown · non-contrast
Comparison: 05/13/2017

CLINICAL DATA: Left knee pain. Chronic anteromedial pain,
worsening. History of tibial plateau fracture.

EXAM:
LEFT KNEE 3 VIEWS

[knee ap]
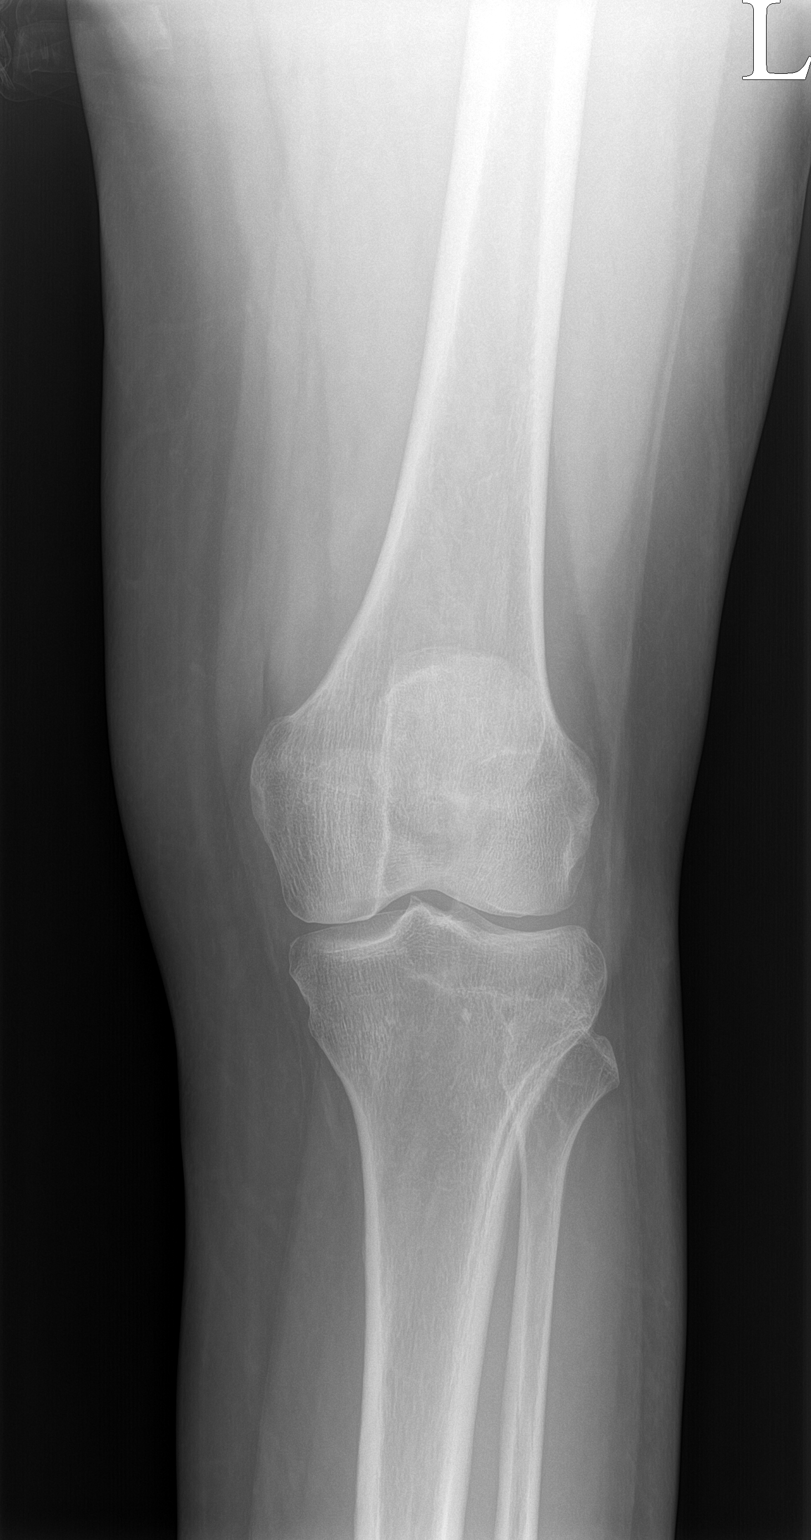

[knee lat]
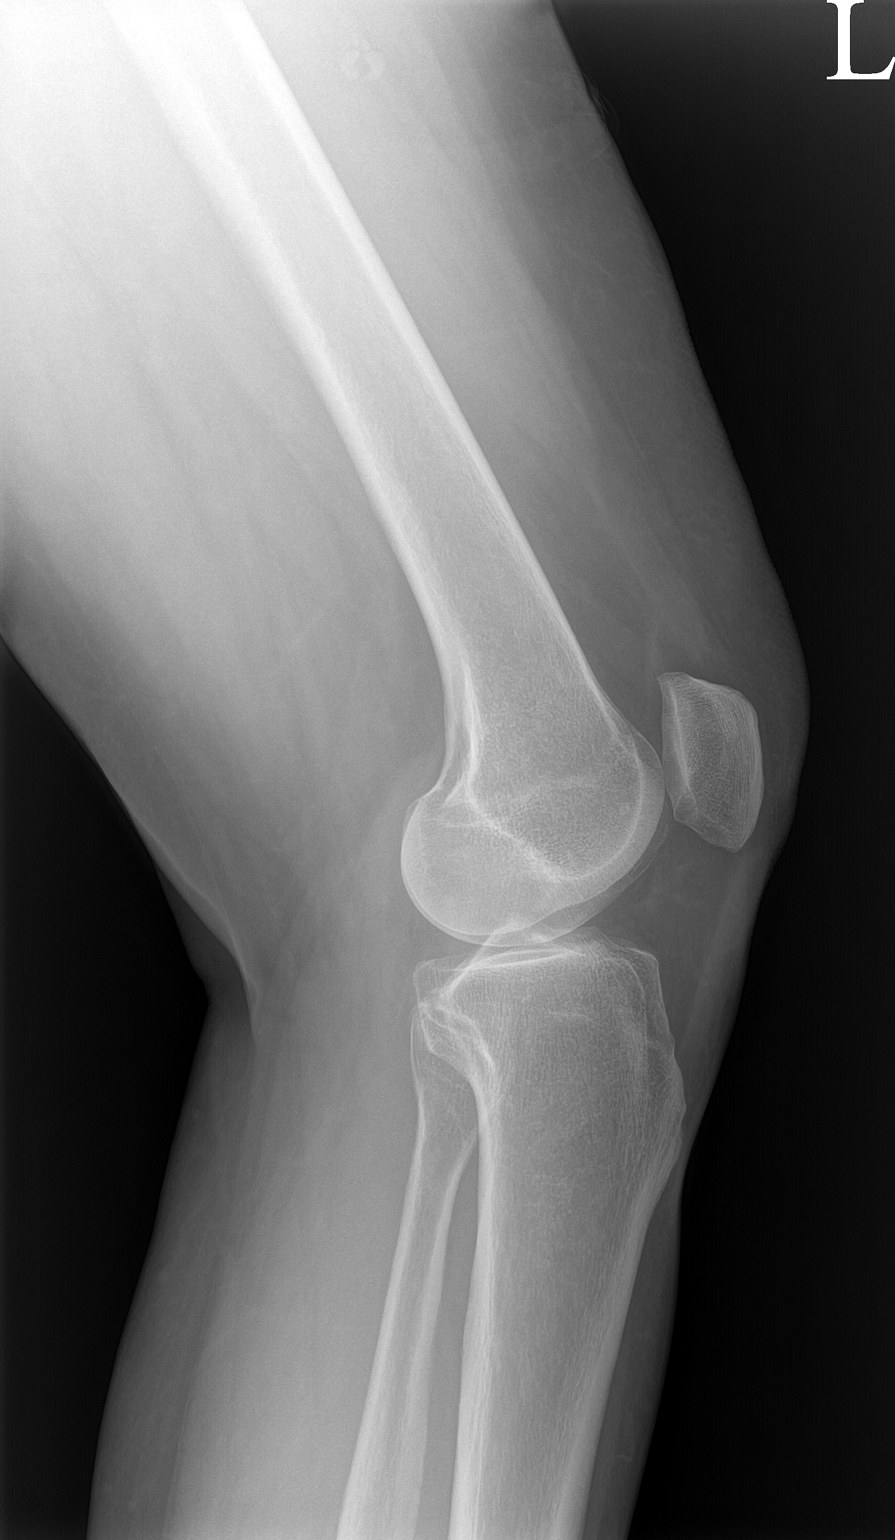

[patella]
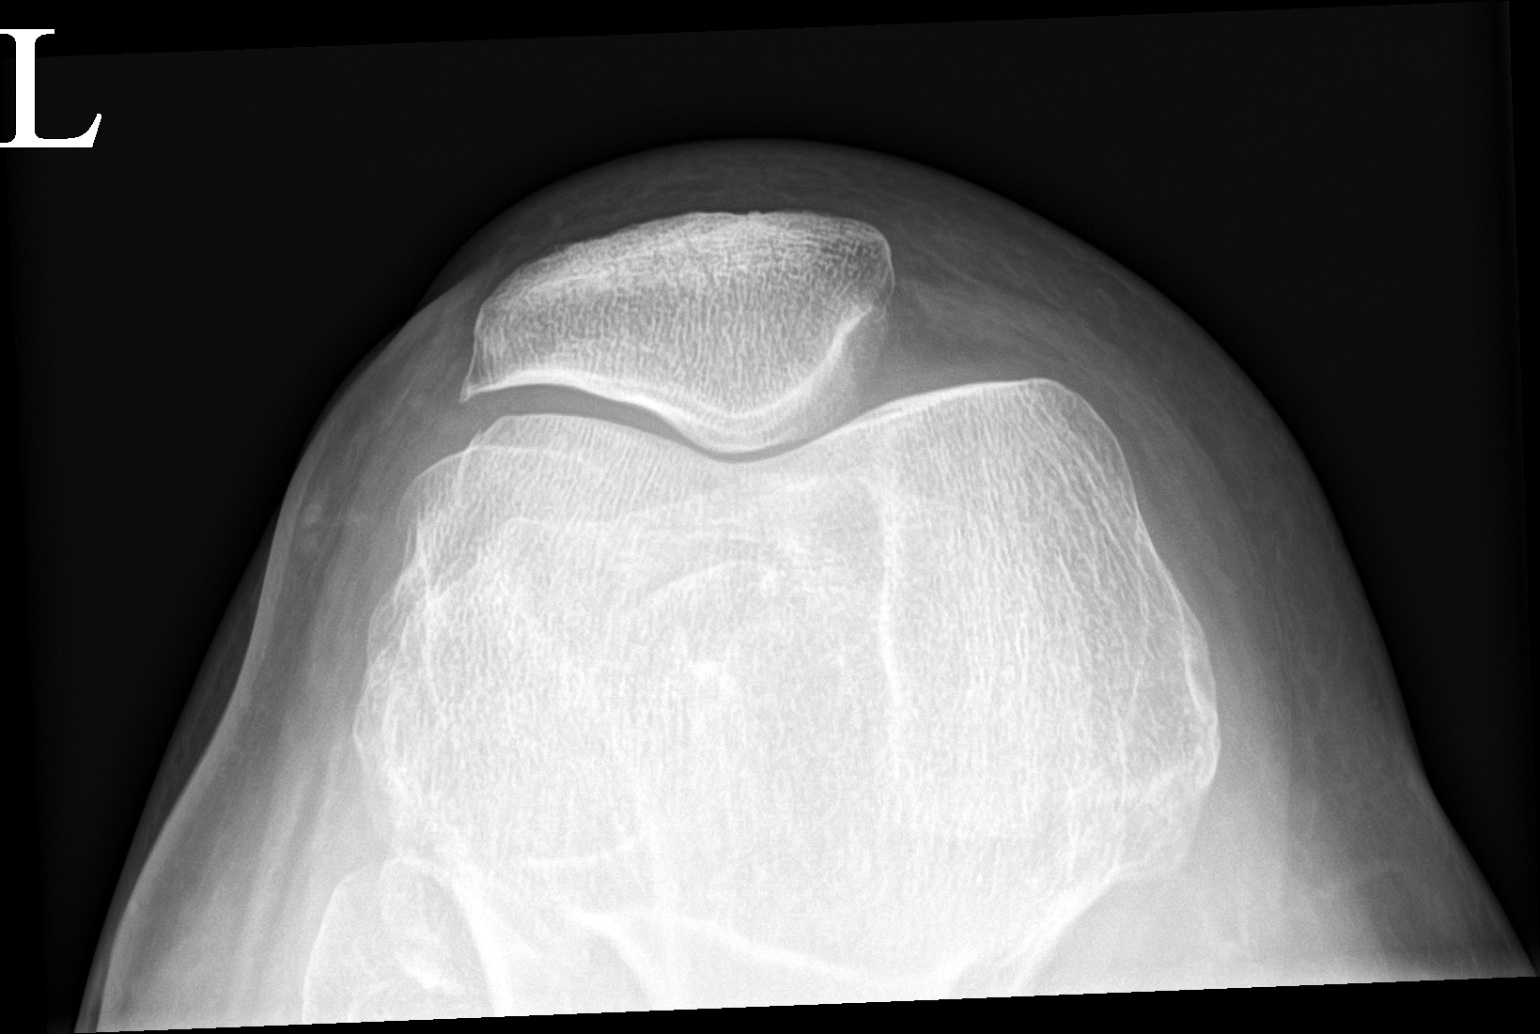

[3 of 3 positions shown; findings below may reference images not displayed]

FINDINGS: Normal alignment. There is mild lateral patellofemoral joint space
narrowing. Tibiofemoral joint spaces are preserved. Minimal
peripheral patellofemoral spurring and spurring of the tibial
spines. No fracture, erosion, or focal bone abnormality. No joint
effusion. No focal soft tissue abnormality.
IMPRESSION: Mild patellofemoral osteoarthritis.

## 2021-10-16 DIAGNOSIS — I700 Atherosclerosis of aorta: Secondary | ICD-10-CM | POA: Diagnosis not present

## 2021-10-16 DIAGNOSIS — N281 Cyst of kidney, acquired: Secondary | ICD-10-CM | POA: Diagnosis not present

## 2021-10-16 DIAGNOSIS — N132 Hydronephrosis with renal and ureteral calculous obstruction: Secondary | ICD-10-CM | POA: Diagnosis not present

## 2021-10-16 DIAGNOSIS — D259 Leiomyoma of uterus, unspecified: Secondary | ICD-10-CM | POA: Diagnosis not present

## 2021-10-16 DIAGNOSIS — R1032 Left lower quadrant pain: Secondary | ICD-10-CM | POA: Diagnosis not present

## 2021-10-26 ENCOUNTER — Other Ambulatory Visit: Payer: Self-pay | Admitting: Urology

## 2021-10-26 NOTE — Progress Notes (Signed)
Spoke with patient for preop interview, patient stated she passed her stone last night. She has already called the office and was awaiting return phone call.

## 2021-10-31 ENCOUNTER — Ambulatory Visit (HOSPITAL_BASED_OUTPATIENT_CLINIC_OR_DEPARTMENT_OTHER): Admission: RE | Admit: 2021-10-31 | Payer: 59 | Source: Home / Self Care | Admitting: Urology

## 2021-10-31 SURGERY — CYSTOSCOPY/URETEROSCOPY/HOLMIUM LASER/STENT PLACEMENT
Anesthesia: General | Laterality: Left

## 2021-11-06 ENCOUNTER — Other Ambulatory Visit (HOSPITAL_COMMUNITY): Payer: Self-pay

## 2021-11-07 ENCOUNTER — Other Ambulatory Visit (HOSPITAL_COMMUNITY): Payer: Self-pay

## 2021-11-26 NOTE — Progress Notes (Signed)
[place] Phone: [phone] Subjective:   Shelly Shelly [name], am serving as a scribe for Dr. Hulan Saas. This visit occurred during the SARS-CoV-2 public health emergency.  Safety protocols were in place, including screening questions prior to the visit, additional usage of staff PPE, and extensive cleaning of exam room while observing appropriate contact time as indicated for disinfecting solutions.    CC: Neck pain follow-up  [id number]  Shelly Shelly [name] is a 61 y.o. female coming in with complaint of back and neck pain. OMT on 10/01/2021. Patient states that she has a tight neck and R sided lumbar spine pain.   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 POSITIVE muscle aches  Objective  Blood pressure 110/80, pulse 72, height 5\' 5"  (1.651 m), weight 169 lb (76.7 kg), last menstrual period 02/22/2014, SpO2 99 %.   General: Shelly [name] apparent distress alert and oriented x3 mood  and affect normal, dressed appropriately.  HEENT: Pupils equal, extraocular movements intact  Respiratory: Patient's speak in full sentences and does not appear short of breath  Cardiovascular: Shelly [name] lower extremity edema, non tender, Shelly [name] erythema  Neck exam does have some mild loss of lordosis.  Tightness noted in the parascapular region right greater than left.  Negative Spurling's.  Still scapular dyskinesis noted minorly.  Osteopathic findings  C2 flexed rotated and side bent right C6 flexed rotated and side bent left T3 extended rotated and side bent right inhaled rib T8 extended rotated and side bent left L3 flexed rotated and side bent right Sacrum right on right       Assessment and Plan:  Chronic neck pain Chronic but stable.  Is responding relatively well though to osteopathic manipulation.  Patient is only taking over-the-counter medications at the moment.  Discussed posture and ergonomics.  Increase activity slowly.  Follow-up with me again in 6 weeks.   Nonallopathic problems  Decision today to treat with OMT was based on Physical Exam  After verbal consent patient was treated with HVLA, ME, FPR techniques in cervical, rib, thoracic, lumbar, and sacral  areas  Patient tolerated the procedure well with improvement in symptoms  Patient given exercises, stretches and lifestyle modifications  See medications in patient instructions if given  Patient will follow up in 4-8 weeks      The above documentation has been reviewed and is accurate and complete [name], DO  Note: This dictation was prepared with Dragon dictation along with smaller phrase technology. Any transcriptional errors that result from this process are unintentional.

## 2021-11-27 ENCOUNTER — Encounter: Payer: Self-pay | Admitting: Family Medicine

## 2021-11-27 ENCOUNTER — Other Ambulatory Visit: Payer: Self-pay

## 2021-11-27 ENCOUNTER — Ambulatory Visit: Payer: 59 | Admitting: Family Medicine

## 2021-11-27 VITALS — BP 110/80 | HR 72 | Ht 65.0 in | Wt 169.0 lb

## 2021-11-27 DIAGNOSIS — M9902 Segmental and somatic dysfunction of thoracic region: Secondary | ICD-10-CM

## 2021-11-27 DIAGNOSIS — M9901 Segmental and somatic dysfunction of cervical region: Secondary | ICD-10-CM | POA: Diagnosis not present

## 2021-11-27 DIAGNOSIS — M9903 Segmental and somatic dysfunction of lumbar region: Secondary | ICD-10-CM | POA: Diagnosis not present

## 2021-11-27 DIAGNOSIS — M542 Cervicalgia: Secondary | ICD-10-CM | POA: Diagnosis not present

## 2021-11-27 DIAGNOSIS — G2589 Other specified extrapyramidal and movement disorders: Secondary | ICD-10-CM

## 2021-11-27 DIAGNOSIS — M9904 Segmental and somatic dysfunction of sacral region: Secondary | ICD-10-CM | POA: Diagnosis not present

## 2021-11-27 DIAGNOSIS — M9908 Segmental and somatic dysfunction of rib cage: Secondary | ICD-10-CM | POA: Diagnosis not present

## 2021-11-27 DIAGNOSIS — G8929 Other chronic pain: Secondary | ICD-10-CM

## 2021-11-27 NOTE — Patient Instructions (Signed)
Hope your friend picks a nice beach Try to do exercises See me in 6-8 weeks

## 2021-11-28 NOTE — Assessment & Plan Note (Signed)
Chronic but stable.  Is responding relatively well though to osteopathic manipulation.  Patient is only taking over-the-counter medications at the moment.  Discussed posture and ergonomics.  Increase activity slowly.  Follow-up with me again in 6 weeks.

## 2021-11-29 ENCOUNTER — Ambulatory Visit: Payer: 59 | Admitting: Family Medicine

## 2021-12-13 DIAGNOSIS — Z87442 Personal history of urinary calculi: Secondary | ICD-10-CM | POA: Diagnosis not present

## 2021-12-25 DIAGNOSIS — Z87442 Personal history of urinary calculi: Secondary | ICD-10-CM | POA: Diagnosis not present

## 2021-12-31 ENCOUNTER — Other Ambulatory Visit: Payer: Self-pay

## 2021-12-31 NOTE — Progress Notes (Signed)
Established Patient Office Visit  Subjective:  Patient ID: Shelly [name], female    DOB: Nov 27, 1960  Age: 61 y.o. MRN: [id number]  CC:  Chief Complaint  Patient presents with   Transitions Of Care    TOC. Med review/refill. Pt c/o unusual menstrual cycle x3 weeks ago w/ some spotting.     HPI Shelly [name] presents to transfer care to a new provider.  Introduced to Designer, jewellery role and practice setting.  All questions answered.  Discussed provider/patient relationship and expectations.  She has a history of arthritis and neck pain. She sees Dr. Tamala Julian every 6 weeks for joint pain and gets adjusted. Her symptoms are well controlled with these visits.   Shelly [name] has a history of GERD with esophagitis. She is taking pantoprazole daily. She follows with GI and her latest endoscopy showed a non-obstructive Schatzki's ring. She states her GERD symptoms have been stable with changing her diet recently and the pantoprazole.   Catherina went reached menopause at age 45. She started taking DHEA, estradiol and progesterone due to vaginal dryness and some hot flashes. She noted vaginal spotting throughout the year last year and then had 3 days of vaginal bleeding in February. She has since stopped her hormones. She has not had any vaginal bleeding since last month.   Depression and Anxiety Screening done today:  Depression screen [facility] 2/9 01/02/2022 01/19/2021  Decreased Interest 1 0  Down, Depressed, Hopeless 0 0  PHQ - 2 Score 1 0  Altered sleeping 0 -  Tired, decreased energy 2 -  Change in appetite 0 -  Feeling bad or failure about yourself  0 -  Trouble concentrating 0 -  Moving slowly or fidgety/restless 0 -  Suicidal thoughts 0 -  PHQ-9 Score 3 -   GAD 7 : Generalized Anxiety Score 01/02/2022  Nervous, Anxious, on Edge 1  Control/stop worrying 1  Worry too much - different things 2  Trouble relaxing 0  Restless 0  Easily annoyed or irritable 0  Afraid - awful might happen 0  Total  GAD 7 Score 4    Past Medical History:  Diagnosis Date   Chronic kidney disease    kidney stones   Depression    Endometriosis    Fibroid    Fracture, tibial plateau    GERD (gastroesophageal reflux disease)    Heart murmur    Hypothyroid    Inflammatory arthritis 08/28/2016   Insomnia 08/28/2016   Migraine    Osteoarthritis of both feet 08/28/2016   Osteoarthritis of both hands 08/28/2016   Osteoarthritis of both knees 08/28/2016   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 Matthew Saras    Family History  Problem Relation Age of Onset   Hypertension Mother    Dementia Mother    Hypertension Father    Stroke Father    Heart disease Father    Hypertension Brother    Cancer Maternal Aunt 42       breast   Aneurysm Maternal Aunt    Breast cancer Maternal Aunt    Hypertension Maternal Grandmother    Hypertension Maternal Grandfather    Kidney failure Maternal Grandfather    Aneurysm Maternal Grandfather    Cancer Cousin  breast   Breast cancer Cousin    Colon polyps Neg Hx    Esophageal cancer Neg Hx    Pancreatic cancer Neg Hx    Stomach cancer Neg Hx     Social History   Socioeconomic History   Marital status: Married    Spouse name: Not on file   Number of children: 2   Years of education: Not on file   Highest education level: Not on file  Occupational History   Occupation: Therapist, sports    Employer: [employer]  Tobacco Use   Smoking status: Never   Smokeless tobacco: Never  Vaping Use   Vaping Use: Never used  Substance and Sexual Activity   Alcohol use: No   Drug use: No   Sexual activity: Yes    Partners: Male    Birth control/protection: None  Other Topics Concern   Not on file  Social History Narrative   Not on file   Social  Determinants of Health   Financial Resource Strain: Not on file  Food Insecurity: Not on file  Transportation Needs: Not on file  Physical Activity: Not on file  Stress: Not on file  Social Connections: Not on file  Intimate Partner Violence: Not on file    Outpatient Medications Prior to Visit  Medication Sig Dispense Refill   Cholecalciferol (VITAMIN D3) 250 MCG (10000 UT) capsule      estradiol (VIVELLE-DOT) 0.05 MG/24HR patch Place 1 patch onto the skin twice a week 24 patch 3   fluticasone (FLONASE) 50 MCG/ACT nasal spray Place 2 sprays into both nostrils daily. 16 g 6   Krill Oil 500 MG CAPS Take 1 capsule by mouth daily.     levothyroxine (SYNTHROID) 88 MCG tablet Take 1 tablet by mouth once daily 90 tablet 3   nabumetone (RELAFEN) 500 MG tablet Take 1 tablet (500 mg total) by mouth daily. 90 tablet 0   pantoprazole (PROTONIX) 40 MG tablet Take 1 tablet (40 mg total) by mouth daily. 30 tablet 11   progesterone (PROMETRIUM) 100 MG capsule Take 1 capsule by mouth every evening on days 1 thru 26 of calendar month 90 capsule 5   Prasterone, DHEA, (DHEA ADVANCED FORMULA PO) Take by mouth.     tamsulosin (FLOMAX) 0.4 MG CAPS capsule TAKE 1 CAPSULE (0.4 MG TOTAL) BY MOUTH DAILY. 30 capsule 1   No facility-administered medications prior to visit.    Allergies  Allergen Reactions   Compazine [Prochlorperazine Edisylate] Other (See Comments)    Muscle contractions    ROS Review of Systems  Constitutional:  Positive for fatigue.  HENT: Negative.    Eyes: Negative.   Respiratory: Negative.    Cardiovascular: Negative.   Gastrointestinal: Negative.   Endocrine: Positive for cold intolerance. Negative for heat intolerance, polydipsia, polyphagia and polyuria.  Genitourinary: Negative.   Musculoskeletal:  Positive for arthralgias (controlled with Dr. Tamala Julian).  Skin: Negative.   Neurological: Negative.   Psychiatric/Behavioral: Negative.       Objective:    Physical  Exam Vitals and nursing note reviewed.  Constitutional:      General: She is not in acute distress.    Appearance: Normal appearance.  HENT:     Head: Normocephalic and atraumatic.  Eyes:     Conjunctiva/sclera: Conjunctivae normal.  Cardiovascular:     Rate and Rhythm: Normal rate and regular rhythm.     Pulses: Normal pulses.     Heart sounds: Normal heart sounds.  Pulmonary:  Effort: Pulmonary effort is normal.     Breath sounds: Normal breath sounds.  Abdominal:     Palpations: Abdomen is soft.     Tenderness: There is no abdominal tenderness.  Musculoskeletal:     Cervical back: Normal range of motion. No tenderness.  Lymphadenopathy:     Cervical: No cervical adenopathy.  Skin:    General: Skin is warm and dry.  Neurological:     General: No focal deficit present.     Mental Status: She is alert and oriented to person, place, and time.  Psychiatric:        Mood and Affect: Mood normal.        Behavior: Behavior normal.        Thought Content: Thought content normal.        Judgment: Judgment normal.    BP (!) 140/92 (BP Location: Right Arm, Cuff Size: Normal)    Pulse 70    Temp 97.7 F (36.5 C) (Temporal)    Ht '5\' 5"'$  (1.651 m)    Wt 170 lb 3.2 oz (77.2 kg)    LMP 02/22/2014    SpO2 98%    BMI 28.32 kg/m  Wt Readings from Last 3 Encounters:  01/02/22 170 lb 3.2 oz (77.2 kg)  11/27/21 169 lb (76.7 kg)  10/01/21 170 lb (77.1 kg)     Health Maintenance Due  Topic Date Due   HIV Screening  Never done   Hepatitis C Screening  Never done   INFLUENZA VACCINE  05/28/2021    There are no preventive care reminders to display for this patient.  Lab Results  Component Value Date   TSH 1.78 05/31/2019   Lab Results  Component Value Date   WBC 3.2 (L) 05/31/2019   HGB 13.4 05/31/2019   HCT 40.4 05/31/2019   MCV 97.3 05/31/2019   PLT 222.0 05/31/2019   Lab Results  Component Value Date   NA 145 05/31/2019   K 5.0 05/31/2019   CO2 26 05/31/2019    GLUCOSE 100 (H) 05/31/2019   BUN 20 05/31/2019   CREATININE 0.54 05/31/2019   BILITOT 0.4 03/04/2017   ALKPHOS 94 03/04/2017   AST 14 05/31/2019   ALT 16 05/31/2019   PROT 7.3 03/04/2017   ALBUMIN 4.5 03/04/2017   CALCIUM 9.4 05/31/2019   GFR 116.58 05/31/2019   Lab Results  Component Value Date   CHOL 178 05/31/2019   Lab Results  Component Value Date   HDL 62.70 05/31/2019   Lab Results  Component Value Date   LDLCALC 107 (H) 05/31/2019   Lab Results  Component Value Date   TRIG 42.0 05/31/2019   Lab Results  Component Value Date   CHOLHDL 3 05/31/2019   No results found for: HGBA1C    Assessment & Plan:   Problem List Items Addressed This Visit       Cardiovascular and Mediastinum   Aortic atherosclerosis ([place])    Noted on CT scan 12/2020. Prior LDL 107. [facility] She will send a copy of her most recent lab results.       White coat syndrome with high blood pressure but without hypertension    Blood pressure elevated at 140/92 today. She states her blood pressure is always elevated at doctor's offices. Encouraged her to check her blood pressure at home once a week. Follow up if blood pressure consistently >140/90 at home.         Digestive  GERD (gastroesophageal reflux disease)    Chronic, stable. Continue pantoprazole '40mg'$  daily and collaboration and recommendations from GI.         Endocrine   Hypothyroidism    Chronic, stable. She states her last TSH was normal in 08/2021. She will send [place] a copy of her most recent lab results.        Other   Chronic neck pain    Chronic, stable. Follows with Dr. Tamala Julian with sports medicine. Continue collaboration and recommendations from Dr. Tamala Julian.       Vitamin D deficiency    Chronic, stable. Continue vitamin D supplement daily.       Anxiety    She endorses some anxiety and stress caring for her parents at home. She states her mom recently passed away  with dementia and she was her caregiver. She is now helping with care for her father. She is starting to increase exercise again and is planning on changing her diet to help with weight loss. Follow up with any concerns.       Post-menopausal bleeding - Primary    She entered menopause at age 73. She was taking DHEA, estrogen, and progesterone for vaginal dryness and hot flashes. She had vaginal spotting throughout last year and had 3 days of vaginal bleeding in February 2023. She has since stopped all the hormones she was taking. Referral placed to GYN.       Relevant Orders   Ambulatory referral to Gynecology    No orders of the defined types were placed in this encounter.   Follow-up: Return in about 3 months (around 04/04/2022) for CPE.    Charyl Dancer, NP

## 2022-01-02 ENCOUNTER — Ambulatory Visit: Payer: 59 | Admitting: Nurse Practitioner

## 2022-01-02 ENCOUNTER — Encounter: Payer: Self-pay | Admitting: Nurse Practitioner

## 2022-01-02 ENCOUNTER — Other Ambulatory Visit: Payer: Self-pay

## 2022-01-02 VITALS — BP 140/92 | HR 70 | Temp 97.7°F | Ht 65.0 in | Wt 170.2 lb

## 2022-01-02 DIAGNOSIS — G8929 Other chronic pain: Secondary | ICD-10-CM

## 2022-01-02 DIAGNOSIS — E038 Other specified hypothyroidism: Secondary | ICD-10-CM

## 2022-01-02 DIAGNOSIS — K2100 Gastro-esophageal reflux disease with esophagitis, without bleeding: Secondary | ICD-10-CM | POA: Diagnosis not present

## 2022-01-02 DIAGNOSIS — R030 Elevated blood-pressure reading, without diagnosis of hypertension: Secondary | ICD-10-CM

## 2022-01-02 DIAGNOSIS — I700 Atherosclerosis of aorta: Secondary | ICD-10-CM | POA: Diagnosis not present

## 2022-01-02 DIAGNOSIS — M542 Cervicalgia: Secondary | ICD-10-CM

## 2022-01-02 DIAGNOSIS — E559 Vitamin D deficiency, unspecified: Secondary | ICD-10-CM

## 2022-01-02 DIAGNOSIS — F419 Anxiety disorder, unspecified: Secondary | ICD-10-CM

## 2022-01-02 DIAGNOSIS — N950 Postmenopausal bleeding: Secondary | ICD-10-CM | POA: Insufficient documentation

## 2022-01-02 NOTE — Patient Instructions (Signed)
It was great to see you! ? ?I have placed a referral to GYN for your recent vaginal bleeding ? ?Let's follow-up in 3 months, sooner if you have concerns. ? ?If a referral was placed today, you will be contacted for an appointment. Please note that routine referrals can sometimes take up to 3-4 weeks to process. Please call our office if you haven't heard anything after this time frame. ? ?Take care, ? ?Vance Peper, NP ? ?

## 2022-01-02 NOTE — Assessment & Plan Note (Signed)
Blood pressure elevated at 140/92 today. She states her blood pressure is always elevated at doctor's offices. Encouraged her to check her blood pressure at home once a week. Follow up if blood pressure consistently >140/90 at home.  ?

## 2022-01-02 NOTE — Assessment & Plan Note (Signed)
Chronic, stable.  Continue vitamin D supplement daily. 

## 2022-01-02 NOTE — Assessment & Plan Note (Signed)
Chronic, stable. She states her last TSH was normal in 08/2021. She will send [place] a copy of her most recent lab results. ?

## 2022-01-02 NOTE — Assessment & Plan Note (Signed)
Chronic, stable. Follows with Dr. Tamala Julian with sports medicine. Continue collaboration and recommendations from Dr. Tamala Julian.  ?

## 2022-01-02 NOTE — Assessment & Plan Note (Signed)
Noted on CT scan 12/2020. Prior LDL 107. [facility] She will send a copy of her most recent lab results.  ?

## 2022-01-02 NOTE — Assessment & Plan Note (Signed)
Chronic, stable. Continue pantoprazole '40mg'$  daily and collaboration and recommendations from GI.  ?

## 2022-01-02 NOTE — Assessment & Plan Note (Signed)
She entered menopause at age 61. She was taking DHEA, estrogen, and progesterone for vaginal dryness and hot flashes. She had vaginal spotting throughout last year and had 3 days of vaginal bleeding in February 2023. She has since stopped all the hormones she was taking. Referral placed to GYN.  ?

## 2022-01-02 NOTE — Assessment & Plan Note (Signed)
She endorses some anxiety and stress caring for her parents at home. She states her mom recently passed away with dementia and she was her caregiver. She is now helping with care for her father. She is starting to increase exercise again and is planning on changing her diet to help with weight loss. Follow up with any concerns.  ?

## 2022-01-04 DIAGNOSIS — N201 Calculus of ureter: Secondary | ICD-10-CM | POA: Diagnosis not present

## 2022-01-07 ENCOUNTER — Other Ambulatory Visit: Payer: Self-pay | Admitting: Nurse Practitioner

## 2022-01-07 DIAGNOSIS — Z1231 Encounter for screening mammogram for malignant neoplasm of breast: Secondary | ICD-10-CM

## 2022-01-07 NOTE — Progress Notes (Signed)
[name] [address] [place] [address] Phone: [phone] Subjective:   Bruce Donath, am serving as a scribe for Dr. Antoine Primas.  This visit occurred during the SARS-CoV-2 public health emergency.  Safety protocols were in place, including screening questions prior to the visit, additional usage of staff PPE, and extensive cleaning of exam room while observing appropriate contact time as indicated for disinfecting solutions.   I'm seeing this patient by the request  of:  McElwee, Lauren A, NP  CC: Neck and low back pain  [id number]  CITLALIC ARONSON is a 61 y.o. female coming in with complaint of back and neck pain. OMT 11/27/2021. Patient states that she has intermittentR sided lower back pain.   Medications patient has been prescribed: None  Taking:         Reviewed prior external information including notes and imaging from previsou exam, outside providers and external EMR if available.   As well as notes that were available from care everywhere and other healthcare systems.  Past medical history, social, surgical and family history all reviewed in electronic medical record.  No pertanent information unless stated regarding to the chief complaint.   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 POSITIVE muscle aches  Objective  Blood pressure 108/76, pulse 66, height 5\' 5"  (1.651 m), weight 169 lb (76.7 kg), last menstrual period 02/22/2014, SpO2 96 %.   General: No apparent distress alert and oriented x3 mood and affect normal, dressed appropriately.  HEENT: Pupils equal, extraocular movements intact  Respiratory: Patient's speak in full sentences and does not appear short of breath  Cardiovascular: No lower extremity edema, non tender, no erythema  Neck exam does have some loss of lordosis.  Some tenderness to palpation in the paraspinal musculature.  Patient does have tightness in the scapular region, more on the right than the left today.  Osteopathic findings  C2 flexed rotated and side bent right C6 flexed rotated and side bent left T3 extended rotated and side bent right inhaled rib T8 extended rotated and side bent right L2 flexed rotated and side bent right Sacrum right on right       Assessment and Plan:  Chronic neck pain Chronic with exacerbation.  Discussed icing regimen and home exercise, which activities to do and which ones to avoid.  Increase activity.  Follow-up again in 6 weeks    Nonallopathic problems  Decision today to treat with OMT was based on Physical Exam  After verbal consent patient was treated with HVLA,  ME, FPR techniques in cervical, rib, thoracic, lumbar, and sacral  areas  Patient tolerated the procedure well with improvement in symptoms  Patient given exercises, stretches and lifestyle modifications  See medications in patient instructions if given  Patient will follow up in 4-8 weeks      The above documentation has been reviewed and is accurate and complete Judi Saa, DO        Note: This dictation was prepared with Dragon dictation along with smaller phrase technology. Any transcriptional errors that result from this process are unintentional.

## 2022-01-10 ENCOUNTER — Other Ambulatory Visit: Payer: Self-pay

## 2022-01-10 ENCOUNTER — Ambulatory Visit: Payer: 59 | Admitting: Family Medicine

## 2022-01-10 VITALS — BP 108/76 | HR 66 | Ht 65.0 in | Wt 169.0 lb

## 2022-01-10 DIAGNOSIS — M9903 Segmental and somatic dysfunction of lumbar region: Secondary | ICD-10-CM | POA: Diagnosis not present

## 2022-01-10 DIAGNOSIS — M542 Cervicalgia: Secondary | ICD-10-CM | POA: Diagnosis not present

## 2022-01-10 DIAGNOSIS — M9908 Segmental and somatic dysfunction of rib cage: Secondary | ICD-10-CM

## 2022-01-10 DIAGNOSIS — M9904 Segmental and somatic dysfunction of sacral region: Secondary | ICD-10-CM

## 2022-01-10 DIAGNOSIS — G8929 Other chronic pain: Secondary | ICD-10-CM | POA: Diagnosis not present

## 2022-01-10 DIAGNOSIS — M9902 Segmental and somatic dysfunction of thoracic region: Secondary | ICD-10-CM

## 2022-01-10 DIAGNOSIS — M9901 Segmental and somatic dysfunction of cervical region: Secondary | ICD-10-CM | POA: Diagnosis not present

## 2022-01-10 NOTE — Assessment & Plan Note (Signed)
Chronic with exacerbation.  Discussed icing regimen and home exercise, which activities to do and which ones to avoid.  Increase activity.  Follow-up again in 6 weeks ?

## 2022-01-10 NOTE — Patient Instructions (Signed)
Good to see you.  ? ?Please be careful w/ your ladder in the tub. ? ?Follow-up in 6 weeks. ? ? ?

## 2022-01-15 ENCOUNTER — Ambulatory Visit
Admission: RE | Admit: 2022-01-15 | Discharge: 2022-01-15 | Disposition: A | Discharge status: Home / Self Care | Payer: 59 | Source: Ambulatory Visit | Attending: Nurse Practitioner | Admitting: Nurse Practitioner

## 2022-01-15 ENCOUNTER — Other Ambulatory Visit: Payer: Self-pay

## 2022-01-15 DIAGNOSIS — Z1231 Encounter for screening mammogram for malignant neoplasm of breast: Secondary | ICD-10-CM | POA: Diagnosis not present

## 2022-02-15 NOTE — Progress Notes (Signed)
?Shelly [name] D.O. ?[facility] ?[place] ?Phone: [phone] ?Subjective:   ?I, Shelly [name], am serving as a scribe for Dr. Hulan Saas. ? ?I'm seeing this patient by the request  of:  Charyl Dancer, NP ? ?CC: Neck and back pain follow-up ? ?[id number]  ?Shelly [name] is a 61 y.o. female coming in with complaint of back and neck pain. OMT on 01/10/2022. Patient states routine OMT, patient still has a lot of stress at work and home life. ?Client in being able to work out as regularly as she would like. ? ?Medications patient has been prescribed: None ? ?Taking: ? ? ?  ? ? ? ? ?Reviewed prior external information including notes and imaging from previsou exam, outside providers and external EMR if available.  ? ?As well as notes that were available from care everywhere and other healthcare systems. ? ?Past medical history, social, surgical and family history all reviewed in electronic medical record.  No pertanent information unless stated regarding to the chief complaint.  ? ?Past Medical History:  ?Diagnosis Date  ? Chronic kidney disease   ? kidney stones  ? Depression   ? Endometriosis   ? Fibroid   ? Fracture, tibial plateau   ? GERD (gastroesophageal reflux disease)   ? Heart murmur   ? Hypothyroid   ? Inflammatory arthritis 08/28/2016  ? Insomnia 08/28/2016  ? Migraine   ? Osteoarthritis of both feet 08/28/2016  ? Osteoarthritis of both hands 08/28/2016  ? Osteoarthritis of both knees 08/28/2016  ? 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 Sicca ([place]) 08/28/2016  ? Tubular adenoma of colon 2015  ? Vitamin D deficiency 08/28/2016  ?  ?Allergies  ?Allergen Reactions  ? Compazine [Prochlorperazine Edisylate] Other (See Comments)  ?  Muscle contractions  ? ? ? ?Review of Systems: ? No headache, visual changes, nausea, vomiting, diarrhea, constipation, dizziness, abdominal pain, skin rash, fevers, chills, night sweats, weight loss, swollen lymph nodes,  joint swelling, chest  pain, shortness of breath, mood changes. POSITIVE muscle aches, body aches ? ?Objective  ?Blood pressure 110/80, pulse 68, height '5\' 5"'$  (1.651 m), weight 165 lb (74.8 kg), last menstrual period 02/22/2014, SpO2 98 %. ?  ?General: No apparent distress alert and oriented x3 mood and affect normal, dressed appropriately.  ?HEENT: Pupils equal, extraocular movements intact  ?Respiratory: Patient's speak in full sentences and does not appear short of breath  ?Cardiovascular: No lower extremity edema, non tender, no erythema  ?Neuro: Cranial nerves II through XII are intact, neurovascularly intact in all extremities with 2+ DTRs and 2+ pulses.  ?Gait normal with good balance and coordination.  ?MSK:  Non tender with full range of motion and good stability and symmetric strength and tone of shoulders, elbows, wrist, hip, knee and ankles bilaterally.  ?Back - Normal skin, Spine with normal alignment and no deformity.  No tenderness to vertebral process palpation.  Paraspinous muscles are not tender and without spasm.   Range of motion is full at neck and lumbar sacral regions ? ?Osteopathic findings ? ?C2 flexed rotated and side bent right ?C6 flexed rotated and side bent left ?T3 extended rotated and side bent right inhaled rib ?T9 extended rotated and side bent left ?L2 flexed rotated and side bent right ?Sacrum right on right ? ? ? ? ?  ?Assessment and Plan: ? ?Scapular dyskinesis ?Chronic problem neck and continues to have some difficulty.  Does have the Relafen for any breakthrough for this  exacerbation.  Discussed icing regimen and home exercises, discussed which activities to do which ones to avoid.  Increase activity slowly.  Follow-up again in 6 to 8 weeks. ?  ? ?Nonallopathic problems ? ?Decision today to treat with OMT was based on Physical Exam ? ?After verbal consent patient was treated with HVLA, ME, FPR techniques in cervical, rib, thoracic, lumbar, and sacral  areas ? ?Patient tolerated the procedure well  with improvement in symptoms ? ?Patient given exercises, stretches and lifestyle modifications ? ?See medications in patient instructions if given ? ?Patient will follow up in 4-8 weeks ? ?  ? ? ?The above documentation has been reviewed and is accurate and complete [name], DO ? ? ? ?  ? ? Note: This dictation was prepared with Dragon dictation along with smaller phrase technology. Any transcriptional errors that result from this process are unintentional.    ?  ?  ? ?

## 2022-02-18 ENCOUNTER — Ambulatory Visit: Payer: 59 | Admitting: Family Medicine

## 2022-02-18 VITALS — BP 110/80 | HR 68 | Ht 65.0 in | Wt 165.0 lb

## 2022-02-18 DIAGNOSIS — M9908 Segmental and somatic dysfunction of rib cage: Secondary | ICD-10-CM | POA: Diagnosis not present

## 2022-02-18 DIAGNOSIS — M9902 Segmental and somatic dysfunction of thoracic region: Secondary | ICD-10-CM

## 2022-02-18 DIAGNOSIS — M9901 Segmental and somatic dysfunction of cervical region: Secondary | ICD-10-CM

## 2022-02-18 DIAGNOSIS — G2589 Other specified extrapyramidal and movement disorders: Secondary | ICD-10-CM

## 2022-02-18 DIAGNOSIS — M9903 Segmental and somatic dysfunction of lumbar region: Secondary | ICD-10-CM | POA: Diagnosis not present

## 2022-02-18 DIAGNOSIS — M9904 Segmental and somatic dysfunction of sacral region: Secondary | ICD-10-CM

## 2022-02-18 NOTE — Patient Instructions (Addendum)
Good to see you ?Good luck with the roommates ?Find time for yourself ?Follow up in 6-8 weeks  ?

## 2022-02-18 NOTE — Assessment & Plan Note (Signed)
Chronic problem neck and continues to have some difficulty.  Does have the Relafen for any breakthrough for this exacerbation.  Discussed icing regimen and home exercises, discussed which activities to do which ones to avoid.  Increase activity slowly.  Follow-up again in 6 to 8 weeks. ?

## 2022-02-25 ENCOUNTER — Other Ambulatory Visit (HOSPITAL_COMMUNITY): Payer: Self-pay

## 2022-02-25 NOTE — Progress Notes (Signed)
GYNECOLOGY  VISIT ?  ?HPI: ?61 y.o.   Married  Caucasian  female   ?[id number] with Patient's last menstrual period was 02/22/2014.   ?here for postmenopausal bleeding off and on for 6 months. In February had full blown cycle. Has been seeing [facility] for saliva hormone testing.  ? ?She is on HRT, since late 2021.  ?Using Vivelle Dot twice weekly and Prometrium day 1 - 26.  ? ?Has occasional spotting, since April 2022. ?January and February bled every 2 weeks.  ?In February had a full period with no cramping or pain.  ?She stopped her HRT when this occurred.  She was on DHEA at the time. ? ?Stopped having cycles at age 54.   ? ?Hx endometriosis and fibroids.   ?See Epic for pelvic US 05/26/12.  ?No hx abnormal paps.  ? ?Hx renal stones.  ? ?Recentt adjustment in her thyroid hormone medication.  ? ?GYNECOLOGIC HISTORY: ?Patient's last menstrual period was 02/22/2014. ?Contraception:  PMP ?Menopausal hormone therapy:  Vivelle Dot 0.'05mg'$ , Prometrium '100mg'$  ?Last mammogram:  01-15-22 Neg/Birads1 ?Last pap smear:  02-14-21 Neg:Neg HR HPV, 04-06-18 Neg:Neg HR HPV, 01-10-15 Neg:Neg HR HPV ?       ?OB History   ? ? Gravida  ?0  ? Para  ?0  ? Term  ?0  ? Preterm  ?0  ? AB  ?0  ? Living  ?0  ?  ? ? SAB  ?0  ? IAB  ?0  ? Ectopic  ?0  ? Multiple  ?0  ? Live Births  ?   ?   ?  ? Obstetric Comments  ?Has 2 adopted children  ?  ? ?  ?    ? ?Patient Active Problem List  ? Diagnosis Date Noted  ? Post-menopausal bleeding 01/02/2022  ? White coat syndrome with high blood pressure but without hypertension 01/02/2022  ? Left knee pain 06/01/2021  ? Aortic atherosclerosis ([place]) 01/25/2021  ? History of nephrolithiasis 01/19/2021  ? Anxiety 08/11/2019  ? 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  ? Cubital tunnel syndrome 10/07/2018  ? Nonallopathic lesion of rib cage 07/08/2018  ? Nonallopathic lesion of sacral region 07/08/2018  ? Osteoarthritis of both hands 08/28/2016  ? Osteoarthritis of both feet 08/28/2016  ? Osteoarthritis of both  knees 08/28/2016  ? 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 Vitamin D deficiency 08/28/2016  ? Chronic neck pain 03/01/2016  ? Scapular dyskinesis 03/01/2016  ? Nonallopathic lesion of cervical region 03/01/2016  ? Nonallopathic lesion of thoracic region 03/01/2016  ? Nonallopathic lesion of lumbosacral region 03/01/2016  ? Family history of abdominal aortic aneurysm 08/12/2012  ? Renal calculus, left 06/04/2012  ? GERD (gastroesophageal reflux disease) 05/01/2012  ? History of migraine 05/01/2012  ? Hypothyroidism 05/01/2012  ? Leukopenia 05/01/2012  ? Allergic rhinitis due to allergen 05/01/2012  ? ? ?Past Medical History:  ?Diagnosis Date  ? Chronic kidney disease   ? kidney stones  ? Depression   ? Endometriosis   ? Fibroid   ? Fracture, tibial plateau   ? GERD (gastroesophageal reflux disease)   ? Heart murmur   ? Hypothyroid   ? Inflammatory arthritis 08/28/2016  ? Insomnia 08/28/2016  ? Migraine   ? Osteoarthritis of both feet 08/28/2016  ? Osteoarthritis of both hands 08/28/2016  ? Osteoarthritis of both knees 08/28/2016  ? 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 Sicca ([place]) 08/28/2016  ? Tubular adenoma of colon 2015  ? Vitamin D deficiency 08/28/2016  ? ? ?Past Surgical  History:  ?Procedure Laterality Date  ? LAPAROSCOPY  1994  ? endometriosis  ? PELVIC LAPAROSCOPY  1993  ? d/t endometriosis--Dr. Matthew Saras  ? ? ?Current Outpatient Medications  ?Medication Sig Dispense Refill  ? Cholecalciferol (VITAMIN D3) 250 MCG (10000 UT) capsule     ? estradiol (VIVELLE-DOT) 0.05 MG/24HR patch Place 1 patch onto the skin twice a week 24 patch 3  ? fluticasone (FLONASE) 50 MCG/ACT nasal spray Place 2 sprays into both nostrils daily. 16 g 6  ? Krill Oil 500 MG CAPS Take 1 capsule by mouth daily.    ? levothyroxine (SYNTHROID) 88 MCG tablet Take 1 tablet by mouth once daily 90 tablet 3  ? nabumetone (RELAFEN) 500 MG tablet Take 1 tablet (500 mg total) by mouth daily. 90 tablet 0  ? pantoprazole (PROTONIX) 40 MG tablet Take 1 tablet (40 mg total) by  mouth daily. 30 tablet 11  ? progesterone (PROMETRIUM) 100 MG capsule Take 1 capsule by mouth every evening on days 1 thru 26 of calendar month 90 capsule 5  ? ?No current facility-administered medications for this visit.  ?  ? ?ALLERGIES: Compazine [prochlorperazine edisylate] ? ?Family History  ?Problem Relation Age of Onset  ? Hypertension Mother   ? Dementia Mother   ? Hypertension Father   ? Stroke Father   ? Heart disease Father   ? Hypertension Brother   ? Cancer Maternal Aunt 29  ?     breast  ? Aneurysm Maternal Aunt   ? Breast cancer Maternal Aunt   ? Hypertension Maternal Grandmother   ? Hypertension Maternal Grandfather   ? Kidney failure Maternal Grandfather   ? Aneurysm Maternal Grandfather   ? Cancer Cousin   ?     breast  ? Breast cancer Cousin   ? Colon polyps Neg Hx   ? Esophageal cancer Neg Hx   ? Pancreatic cancer Neg Hx   ? Stomach cancer Neg Hx   ? ? ?Social History  ? ?Socioeconomic History  ? Marital status: Married  ?  Spouse name: Not on file  ? Number of children: 2  ? Years of education: Not on file  ? Highest education level: Not on file  ?Occupational History  ? Occupation: Therapist, sports  ?  Employer: [employer]  ?Tobacco Use  ? Smoking status: Never  ? Smokeless tobacco: Never  ?Vaping Use  ? Vaping Use: Never used  ?Substance and Sexual Activity  ? Alcohol use: No  ? Drug use: No  ? Sexual activity: Yes  ?  Partners: Male  ?  Birth control/protection: None  ?Other Topics Concern  ? Not on file  ?Social History Narrative  ? Not on file  ? ?Social Determinants of Health  ? ?Financial Resource Strain: Not on file  ?Food Insecurity: Not on file  ?Transportation Needs: Not on file  ?Physical Activity: Not on file  ?Stress: Not on file  ?Social Connections: Not on file  ?Intimate Partner Violence: Not on file  ? ? ?Review of Systems  ?Genitourinary:  Positive for vaginal bleeding (postmenopausal bleeding).  ?All other systems reviewed and are negative. ? ?PHYSICAL EXAMINATION:   ? ?BP 124/66  Comment: slightly irregular  Pulse 68   Ht 5' 2.5" (1.588 m)   Wt 162 lb (73.5 kg)   LMP 02/22/2014   SpO2 98%   BMI 29.16 kg/m?     ?General appearance: alert, cooperative and appears stated age ?Head: Normocephalic, without obvious abnormality, atraumatic ?Neck: no adenopathy, supple,  symmetrical, trachea midline and thyroid normal to inspection and palpation ?Lungs: clear to auscultation bilaterally ?Heart: regular rate and rhythm ?Abdomen: soft, non-tender, no masses,  no organomegaly ?Extremities: extremities normal, atraumatic, no cyanosis or edema ?Skin: Skin color, texture, turgor normal. No rashes or lesions ?No abnormal inguinal nodes palpated ?Neurologic: Grossly normal ? ?Pelvic: External genitalia:  no lesions ?             Urethra:  normal appearing urethra with no masses, tenderness or lesions ?             Bartholins and Skenes: normal    ?             Vagina: normal appearing vagina with normal color and discharge, no lesions ?             Cervix: no lesions.  Tiny os.  ?               ?Bimanual Exam:  Uterus:  normal size, contour, position, consistency, mobility, non-tender ?             Adnexa: no mass, fullness, tenderness ?             Rectal exam: yes.  Confirms. ?             Anus:  normal sphincter tone, no lesions ? ?Chaperone was present for exam:  Kimalexis, CMA ? ?ASSESSMENT ? ?Postmenopausal bleeding.  ?HRT, cyclic. ?Hx fibroids. ?Hx endometriosis. ?Cervical cancer screening.  ?Hypothyroidism.  ?Hx renal stones.  ? ?PLAN ? ?Pap and HR HPV testing.  ?Postmenopausal bleeding discussed.  Etiologies include:  HRT, polyps, fibroids, precancer and cancer.  ?She will return for pelvic ultrasound and possible endometrial biopsy.  She may need cervical dilation to accomplish an endometrial biopsy.  ?Questions invited and answered. ? ?  ?An After Visit Summary was printed and given to the patient. ? ?30 min  total time was spent for this patient encounter, including preparation,  face-to-face counseling with the patient, coordination of care, and documentation of the encounter. ? ? ?

## 2022-02-28 ENCOUNTER — Ambulatory Visit: Payer: 59 | Admitting: Obstetrics and Gynecology

## 2022-02-28 ENCOUNTER — Other Ambulatory Visit (HOSPITAL_COMMUNITY)
Admission: RE | Admit: 2022-02-28 | Discharge: 2022-02-28 | Disposition: A | Discharge status: Home / Self Care | Payer: 59 | Source: Ambulatory Visit | Attending: Obstetrics and Gynecology | Admitting: Obstetrics and Gynecology

## 2022-02-28 ENCOUNTER — Encounter: Payer: Self-pay | Admitting: Obstetrics and Gynecology

## 2022-02-28 VITALS — BP 124/66 | HR 68 | Ht 62.5 in | Wt 162.0 lb

## 2022-02-28 DIAGNOSIS — Z124 Encounter for screening for malignant neoplasm of cervix: Secondary | ICD-10-CM | POA: Insufficient documentation

## 2022-02-28 DIAGNOSIS — N950 Postmenopausal bleeding: Secondary | ICD-10-CM

## 2022-02-28 NOTE — Patient Instructions (Signed)
Postmenopausal Bleeding Postmenopausal bleeding is any bleeding that a woman has after she has entered menopause. Menopause is the end of a woman's fertile years. After menopause, a woman no longer ovulates and does not have menstrual periods. Therefore, she should no longer have bleeding from her vagina. Postmenopausal bleeding may have various causes, including: Menopausal hormone therapy (MHT). Endometrial atrophy. After menopause, low estrogen hormone levels cause the membrane that lines the uterus (endometrium) to become thin. You may have bleeding as the endometrium thins. Endometrial hyperplasia. This condition is caused by excess estrogen hormones and low levels of progesterone hormones. The excess estrogen causes the endometrium to thicken, which can lead to bleeding. In some cases, this can lead to cancer of the uterus. Endometrial cancer. Noncancerous growths (polyps) on the endometrium, the lining of the uterus, or the cervix. Uterine fibroids. These are noncancerous growths in or around the uterus muscle tissue that can cause heavy bleeding. Any type of postmenopausal bleeding, even if it appears to be a typical menstrual period, should be checked by your health care provider. Treatment will depend on the cause of the bleeding. Follow these instructions at home:  Pay attention to any changes in your symptoms. Let your health care provider know about them. Avoid using tampons and douches as told by your health care provider. Change your pads regularly. Get regular pelvic exams, including Pap tests, as told by your health care provider. Take iron supplements as told by your health care provider. Take over-the-counter and prescription medicines only as told by your health care provider. Keep all follow-up visits. This is important. Contact a health care provider if: You have new bleeding from the vagina after menopause. You have pain in your abdomen. Get help right away if: You have  a fever or chills. You have severe pain with bleeding. You are passing blood clots. You have heavy bleeding, need more than 1 pad an hour, and have never experienced this before. You have headaches or feel faint or dizzy. Summary Postmenopausal bleeding is any bleeding that a woman has after she has entered into menopause. Postmenopausal bleeding may have various causes. Treatment will depend on the cause of the bleeding. Any type of postmenopausal bleeding, even if it appears to be a typical menstrual period, should be checked by your health care provider. Be sure to pay attention to any changes in your symptoms and keep all follow-up visits. This information is not intended to replace advice given to you by your health care provider. Make sure you discuss any questions you have with your health care provider. Document Revised: 03/30/2020 Document Reviewed: 03/30/2020 Elsevier Patient Education  2023 Elsevier Inc.  

## 2022-03-05 LAB — CYTOLOGY - PAP
Adequacy: ABSENT
Comment: NEGATIVE
Diagnosis: NEGATIVE
High risk HPV: NEGATIVE

## 2022-03-11 DIAGNOSIS — H524 Presbyopia: Secondary | ICD-10-CM | POA: Diagnosis not present

## 2022-03-11 DIAGNOSIS — H52222 Regular astigmatism, left eye: Secondary | ICD-10-CM | POA: Diagnosis not present

## 2022-04-01 NOTE — Progress Notes (Unsigned)
Shelly [name] [address] [place] Phone: [phone] Subjective:   IVilma [name], am serving as a scribe for Dr. Hulan Saas.  I'm seeing this patient by the request  of:  McElwee, Lauren A, NP  CC: back and neck pain   [id number]  Shelly [name] is a 61 y.o. female coming in with complaint of back and neck pain. OMT 02/18/2022. Patient states same per usual. No new complaints.  Has had some tightness recently.  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 POSITIVE muscle aches, body aches  Objective  Blood pressure 122/74, pulse 60, height '5\' 2"'$  (1.575 m), weight 164 lb (74.4 kg), last menstrual period 02/22/2014, SpO2 98 %.   General: No apparent distress alert and oriented x3 mood and affect normal, dressed appropriately.  HEENT: Pupils equal, extraocular movements intact  Respiratory: Patient's speak in full sentences and does not appear short of breath  Cardiovascular: No lower extremity  edema, non tender, no erythema  Gait normal with good balance and coordination.  MSK: Patient still has scapular dyskinesis right and left.  Limited sidebending of the neck bilaterally as well.  Patient does have tightness with FABER test bilaterally as well.  Osteopathic findings  C2 flexed rotated and side bent right C6 flexed rotated and side bent left T3 extended rotated and side bent right inhaled rib L3 flexed rotated and side bent left  Sacrum right on right    Assessment and Plan:  Scapular dyskinesis Chronic, with mild exacerbation.  Discussed with patient about taking him x-ray.  Patient is responding well to osteopathic manipulation. The upper back being a little bit worse recently.  Follow-up with me again in 6 to 8 weeks    Nonallopathic problems  Decision today to treat with OMT was based on Physical Exam  After verbal consent patient was treated with HVLA, ME, FPR techniques in cervical, rib, thoracic, lumbar, and sacral  areas  Patient tolerated the procedure well with improvement in symptoms  Patient given exercises, stretches and lifestyle modifications  See medications in patient instructions if given  Patient will follow up in 4-8 weeks      The above documentation has been reviewed and is accurate and complete [name], DO        Note: This dictation was prepared with Dragon dictation along with smaller phrase technology. Any transcriptional errors that result from this process are unintentional.

## 2022-04-01 NOTE — Progress Notes (Unsigned)
GYNECOLOGY  VISIT   HPI: 61 y.o.   Married  Caucasian  female   [id number] with Patient's last menstrual period was 02/22/2014.   here for pelvic ultrasound and possible EMB.    Patient is having postmenopausal bleeding on HRT since April 2022.  Bleeding every 2 weeks.  Has some hot flashes during the night.   Using Vivelle Dot twice weekly and Prometrium day 1 - 26.  She is running out of her estrogen.  Has enough progesterone.  She would like to continue with her HRT.   Has occasional spotting, since April 2022. January and February bled every 2 weeks.  In February had a full period with no cramping or pain.  She stopped her HRT when this occurred.  She was on DHEA at the time.  Hx fibroid and endometriosis.   GYNECOLOGIC HISTORY: Patient's last menstrual period was 02/22/2014. Contraception:  PMP Menopausal hormone therapy:  Vivelle Dot 0.'05mg'$  and Prometrium '100mg'$  Last mammogram:  01-15-22 Neg/Birads1 Last pap smear:  02-28-22 Neg:Neg HR HPV, 02-14-21 Neg:Neg HR HPV, 04-06-18 Neg:Neg HR HPV        OB History     Gravida  0   Para  0   Term  0   Preterm  0   AB  0   Living  0      SAB  0   IAB  0   Ectopic  0   Multiple  0   Live Births           Obstetric Comments  Has 2 adopted children            Patient Active Problem List   Diagnosis Date Noted   Post-menopausal bleeding 01/02/2022   White coat syndrome with high blood pressure but without hypertension 01/02/2022   Left knee pain 06/01/2021   Aortic atherosclerosis ([place]) 01/25/2021   History of nephrolithiasis 01/19/2021   Anxiety 08/11/2019   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   Cubital tunnel syndrome 10/07/2018   Nonallopathic lesion of rib cage 07/08/2018   Nonallopathic lesion of sacral region 07/08/2018   Osteoarthritis of both hands 08/28/2016   Osteoarthritis of both feet 08/28/2016   Osteoarthritis of both knees 08/28/2016   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   Vitamin D deficiency  08/28/2016   Chronic neck pain 03/01/2016   Scapular dyskinesis 03/01/2016   Nonallopathic lesion of cervical region 03/01/2016   Nonallopathic lesion of thoracic region 03/01/2016   Nonallopathic lesion of lumbosacral region 03/01/2016   Family history of abdominal aortic aneurysm 08/12/2012   Renal calculus, left 06/04/2012   GERD (gastroesophageal reflux disease) 05/01/2012   History of migraine 05/01/2012   Hypothyroidism 05/01/2012   Leukopenia 05/01/2012   Allergic rhinitis due to allergen 05/01/2012    Past Medical History:  Diagnosis Date   Chronic kidney disease    kidney stones   Depression    Endometriosis    Fibroid    Fracture, tibial plateau    GERD (gastroesophageal reflux disease)    Heart murmur    Hypothyroid    Inflammatory arthritis 08/28/2016   Insomnia 08/28/2016   Migraine    Osteoarthritis of both feet 08/28/2016   Osteoarthritis of both hands 08/28/2016   Osteoarthritis of both knees 08/28/2016   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 Matthew Saras    Current Outpatient Medications  Medication Sig Dispense Refill   Cholecalciferol (VITAMIN D3) 250 MCG (10000 UT) capsule      estradiol (VIVELLE-DOT) 0.05 MG/24HR patch Place 1 patch onto the skin twice a week 24 patch 3   fluticasone (FLONASE) 50 MCG/ACT nasal spray Place 2 sprays into both nostrils daily. 16 g 6   Krill Oil 500 MG CAPS Take 1 capsule by mouth daily.     levothyroxine (SYNTHROID) 88 MCG tablet Take 1 tablet by mouth once daily 90 tablet 3   nabumetone (RELAFEN) 500 MG tablet Take 1 tablet (500 mg total) by mouth daily. 90 tablet 0   pantoprazole (PROTONIX) 40 MG tablet Take 1 tablet (40 mg total) by mouth daily. 30 tablet 11   progesterone (PROMETRIUM) 100 MG capsule  Take 1 capsule by mouth every evening on days 1 thru 26 of calendar month 90 capsule 5   No current facility-administered medications for this visit.     ALLERGIES: Compazine [prochlorperazine edisylate]  Family History  Problem Relation Age of Onset   Hypertension Mother    Dementia Mother    Hypertension Father    Stroke Father    Heart disease Father    Hypertension Brother    Cancer Maternal Aunt 42       breast   Aneurysm Maternal Aunt    Breast cancer Maternal Aunt    Hypertension Maternal Grandmother    Hypertension Maternal Grandfather    Kidney failure Maternal Grandfather    Aneurysm Maternal Grandfather    Cancer Cousin        breast   Breast cancer Cousin    Colon polyps Neg Hx    Esophageal cancer Neg Hx    Pancreatic cancer Neg Hx    Stomach cancer Neg Hx     Social History   Socioeconomic History   Marital status: Married    Spouse name: Not on file   Number of children: 2   Years of education: Not on file   Highest education level: Not on file  Occupational History   Occupation: Programmer, multimedia: [employer]  Tobacco Use   Smoking status: Never   Smokeless tobacco: Never  Vaping Use   Vaping Use: Never used  Substance and Sexual Activity   Alcohol use: No   Drug use: No   Sexual activity: Yes    Partners: Male    Birth control/protection: None  Other Topics Concern   Not on file  Social History Narrative   Not on file   Social Determinants of Health   Financial Resource Strain: Not on file  Food Insecurity: Not on file  Transportation Needs: Not on file  Physical Activity: Not on file  Stress: Not on file  Social Connections: Not on file  Intimate Partner Violence: Not on file    Review of Systems  All other systems reviewed and are negative.  PHYSICAL EXAMINATION:    BP 130/80   Ht 5' 2.5" (1.588 m)   Wt 162 lb (73.5 kg)   LMP 02/22/2014   BMI 29.16 kg/m     General appearance: alert, cooperative and appears stated  age     Chaperone was present for exam:  Estill Bamberg, CMA  Pelvic US  Uterus 8.22 x 5.36 x 4.77 cm.  Fibroids:  all intramural and small than previous scan.  2.31 cm, 1.72 cm, 0.87 cm.  EMS 5.67 mm.  No masses.  Left ovary 1.77 x 0.89 x 1.34 cm.  Right ovary 2.39 x 1.44 x 1.16 cm.  No adnexal masses.  No free fluid.   EMB Consent done.  Sterile prep with Hibiclens.  Paracervical block with 10 cc 1% lidocaine, lot [id number], exp 05/2024.  Pipelle passed to 8 cm x 2.  Tissue to pathology.  No complications.  Minimal EBL.  ASSESSMENT  Postmenopausal bleeding.  HRT.  Fibroids.  PLAN  Results of pelvic US and images discussed. FU EMB.  Refill of Vivelle dot 0.5 mg twice daily. Final plan to follow.    An After Visit Summary was printed and given to the patient.

## 2022-04-02 ENCOUNTER — Encounter: Payer: Self-pay | Admitting: Obstetrics and Gynecology

## 2022-04-02 ENCOUNTER — Other Ambulatory Visit (HOSPITAL_COMMUNITY)
Admission: RE | Admit: 2022-04-02 | Discharge: 2022-04-02 | Disposition: A | Discharge status: Home / Self Care | Payer: 59 | Source: Ambulatory Visit | Attending: Obstetrics and Gynecology | Admitting: Obstetrics and Gynecology

## 2022-04-02 ENCOUNTER — Ambulatory Visit: Payer: 59 | Admitting: Obstetrics and Gynecology

## 2022-04-02 ENCOUNTER — Ambulatory Visit: Payer: 59 | Admitting: Family Medicine

## 2022-04-02 ENCOUNTER — Other Ambulatory Visit (HOSPITAL_COMMUNITY): Payer: Self-pay

## 2022-04-02 ENCOUNTER — Ambulatory Visit (INDEPENDENT_AMBULATORY_CARE_PROVIDER_SITE_OTHER): Payer: 59

## 2022-04-02 VITALS — BP 130/80 | Ht 62.5 in | Wt 162.0 lb

## 2022-04-02 VITALS — BP 122/74 | HR 60 | Ht 62.0 in | Wt 164.0 lb

## 2022-04-02 DIAGNOSIS — M9901 Segmental and somatic dysfunction of cervical region: Secondary | ICD-10-CM | POA: Diagnosis not present

## 2022-04-02 DIAGNOSIS — M9903 Segmental and somatic dysfunction of lumbar region: Secondary | ICD-10-CM | POA: Diagnosis not present

## 2022-04-02 DIAGNOSIS — N950 Postmenopausal bleeding: Secondary | ICD-10-CM | POA: Insufficient documentation

## 2022-04-02 DIAGNOSIS — Z79890 Hormone replacement therapy (postmenopausal): Secondary | ICD-10-CM | POA: Diagnosis not present

## 2022-04-02 DIAGNOSIS — D219 Benign neoplasm of connective and other soft tissue, unspecified: Secondary | ICD-10-CM | POA: Diagnosis not present

## 2022-04-02 DIAGNOSIS — M9908 Segmental and somatic dysfunction of rib cage: Secondary | ICD-10-CM | POA: Diagnosis not present

## 2022-04-02 DIAGNOSIS — M9902 Segmental and somatic dysfunction of thoracic region: Secondary | ICD-10-CM | POA: Diagnosis not present

## 2022-04-02 DIAGNOSIS — G2589 Other specified extrapyramidal and movement disorders: Secondary | ICD-10-CM

## 2022-04-02 DIAGNOSIS — M9904 Segmental and somatic dysfunction of sacral region: Secondary | ICD-10-CM

## 2022-04-02 MED ORDER — ESTRADIOL 0.05 MG/24HR TD PTTW
MEDICATED_PATCH | TRANSDERMAL | 0 refills | Status: DC
  Filled 2022-04-02: qty 8, 28d supply, fill #0

## 2022-04-02 NOTE — Assessment & Plan Note (Signed)
Chronic, with mild exacerbation.  Discussed with patient about taking him x-ray.  Patient is responding well to osteopathic manipulation. The upper back being a little bit worse recently.  Follow-up with me again in 6 to 8 weeks

## 2022-04-02 NOTE — Patient Instructions (Signed)
Good to see you! Enjoy the full house See you again in 6-8 weeks

## 2022-04-02 NOTE — Patient Instructions (Signed)
Endometrial Biopsy  An endometrial biopsy is a procedure to remove tissue samples from the endometrium, which is the lining of the uterus. The tissue that is removed can then be checked under a microscope for disease. This procedure is used to diagnose conditions such as endometrial cancer, endometrial tuberculosis, polyps, or other inflammatory conditions. This procedure may also be used to investigate uterine bleeding to determine where you are in your menstrual cycle or how your hormone levels are affecting the lining of the uterus. Tell a health care provider about: Any allergies you have. All medicines you are taking, including vitamins, herbs, eye drops, creams, and over-the-counter medicines. Any problems you or family members have had with anesthetic medicines. Any blood disorders you have. Any surgeries you have had. Any medical conditions you have. Whether you are pregnant or may be pregnant. What are the risks? Generally, this is a safe procedure. However, problems may occur, including: Bleeding. Pelvic infection. Puncture of the wall of the uterus with the biopsy device (rare). Allergic reactions to medicines. What happens before the procedure? Keep a record of your menstrual cycles as told by your health care provider. You may need to schedule your procedure for a specific time in your cycle. You may want to bring a sanitary pad to wear after the procedure. Plan to have someone take you home from the hospital or clinic. Ask your health care provider about: Changing or stopping your regular medicines. This is especially important if you are taking diabetes medicines, arthritis medicines, or blood thinners. Taking medicines such as aspirin and ibuprofen. These medicines can thin your blood. Do not take these medicines unless your health care provider tells you to take them. Taking over-the-counter medicines, vitamins, herbs, and supplements. What happens during the  procedure? You will lie on an exam table with your feet and legs supported as in a pelvic exam. Your health care provider will insert an instrument (speculum) into your vagina to see your cervix. Your cervix will be cleansed with an antiseptic solution. A medicine (local anesthetic) will be used to numb the cervix. A forceps instrument (tenaculum) will be used to hold your cervix steady for the biopsy. A thin, rod-like instrument (uterine sound) will be inserted through your cervix to determine the length of your uterus and the location where the biopsy sample will be removed. A thin, flexible tube (catheter) will be inserted through your cervix and into the uterus. The catheter will be used to collect the biopsy sample from your endometrial tissue. The catheter and speculum will then be removed, and the tissue sample will be sent to a lab for examination. The procedure may vary among health care providers and hospitals. What can I expect after procedure? You will rest in a recovery area until you are ready to go home. You may have mild cramping and a small amount of vaginal bleeding. This is normal. You may have a small amount of vaginal bleeding for a few days. This is normal. It is up to you to get the results of your procedure. Ask your health care provider, or the department that is doing the procedure, when your results will be ready. Follow these instructions at home: Take over-the-counter and prescription medicines only as told by your health care provider. Do not douche, use tampons, or have sexual intercourse until your health care provider approves. Return to your normal activities as told by your health care provider. Ask your health care provider what activities are safe for you. Follow   instructions from your health care provider about any activity restrictions, such as restrictions on strenuous exercise or heavy lifting. Keep all follow-up visits. This is important. Contact a  health care provider: You have heavy bleeding, or bleed for longer than 2 days after the procedure. You have bad smelling discharge from your vagina. You have a fever or chills. You have a burning sensation when urinating or you have difficulty urinating. You have severe pain in your lower abdomen. Get help right away if you: You have severe cramps in your stomach or back. You pass large blood clots. Your bleeding increases. You become weak or light-headed, or you faint or lose consciousness. Summary An endometrial biopsy is a procedure to remove tissue samples is taken from the endometrium, which is the lining of the uterus. The tissue sample that is removed will be checked under a microscope for disease. This procedure is used to diagnose conditions such as endometrial cancer, endometrial tuberculosis, polyps, or other inflammatory conditions. After the procedure, it is common to have mild cramping and a small amount of vaginal bleeding for a few days. Do not douche, use tampons, or have sexual intercourse until your health care provider approves. Ask your health care provider which activities are safe for you. This information is not intended to replace advice given to you by your health care provider. Make sure you discuss any questions you have with your health care provider. Document Revised: 09/10/2021 Document Reviewed: 05/08/2020 Elsevier Patient Education  2023 Elsevier Inc.  

## 2022-04-04 LAB — SURGICAL PATHOLOGY

## 2022-04-08 ENCOUNTER — Other Ambulatory Visit (HOSPITAL_COMMUNITY): Payer: Self-pay

## 2022-04-08 ENCOUNTER — Other Ambulatory Visit: Payer: Self-pay

## 2022-04-08 DIAGNOSIS — Z79890 Hormone replacement therapy (postmenopausal): Secondary | ICD-10-CM

## 2022-04-08 MED ORDER — PROGESTERONE 200 MG PO CAPS
200.0000 mg | ORAL_CAPSULE | Freq: Every day | ORAL | 0 refills | Status: DC
  Filled 2022-04-08: qty 90, 90d supply, fill #0

## 2022-04-08 MED ORDER — ESTRADIOL 0.05 MG/24HR TD PTTW
MEDICATED_PATCH | TRANSDERMAL | 0 refills | Status: DC
  Filled 2022-04-10: qty 24, 84d supply, fill #0

## 2022-04-10 ENCOUNTER — Other Ambulatory Visit (HOSPITAL_COMMUNITY): Payer: Self-pay

## 2022-05-21 NOTE — Progress Notes (Unsigned)
[place] Phone: [phone] Subjective:   [name], am serving as a scribe for Dr. Hulan Saas.   I'm seeing this patient by the request  of:  McElwee, Lauren A, NP  CC: Neck and back pain follow-up  [id number]  LISETT DIRUSSO is a 61 y.o. female coming in with complaint of back and neck pain. OMT 04/02/2022. Patient states that she has been doing good since last visit. No new pain.  Patient has been doing relatively well overall.  Patient has had a little more stress recently.  Doing some yard work recently as well.  Medications patient has been prescribed: None  Taking:         Reviewed prior external information including notes and imaging from previsou exam, outside providers and external EMR if available.   As well as notes that were available from care everywhere and other healthcare systems.  Past medical history, social, surgical and family history all reviewed in electronic medical record.  No pertanent information unless stated regarding to the chief complaint.   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 POSITIVE muscle aches  Objective  Blood pressure 122/82, pulse 73, height '5\' 2"'$  (1.575 m), weight 158 lb (71.7 kg), last menstrual period 02/22/2014, SpO2 98 %.   General: No apparent distress alert and oriented x3 mood and affect normal, dressed appropriately.  HEENT: Pupils equal, extraocular movements intact  Respiratory: Patient's speak in full sentences and does not appear short of breath  Cardiovascular: No lower extremity edema, non tender, no erythema  Gait MSK:  Back does have some loss lordosis.  Some tenderness to palpation in the paraspinal musculature.  Tightness with FABER test bilaterally right greater than left.  Osteopathic findings  C2 flexed rotated and side bent right C6 flexed rotated and side bent right T3 extended rotated and side bent right inhaled rib T9 extended rotated and side bent left L2 flexed rotated and side bent right Sacrum right on right       Assessment and Plan:  Scapular dyskinesis Scapular dyskinesis noted.  Discussed posture and ergonomics, discussed which activities to do and which ones to avoid.  Patient does respond well to osteopathic manipulation.  Discussed different medications such as nabumetone previously.  Patient is not taking it on a regular basis at the moment.  Follow-up again in 6 to 8 weeks    Nonallopathic problems  Decision today to treat with OMT was based on Physical Exam  After verbal consent patient  was treated with HVLA, ME, FPR techniques in cervical, rib, thoracic, lumbar, and sacral  areas  Patient tolerated the procedure well with improvement in symptoms  Patient given exercises, stretches and lifestyle modifications  See medications in patient instructions if given  Patient will follow up in 4-8 weeks     The above documentation has been reviewed and is accurate and complete [name],  DO         Note: This dictation was prepared with Dragon dictation along with smaller phrase technology. Any transcriptional errors that result from this process are unintentional.

## 2022-05-22 ENCOUNTER — Ambulatory Visit: Payer: 59 | Admitting: Family Medicine

## 2022-05-22 ENCOUNTER — Ambulatory Visit: Payer: 59 | Admitting: Internal Medicine

## 2022-05-22 ENCOUNTER — Other Ambulatory Visit: Payer: Self-pay | Admitting: Nurse Practitioner

## 2022-05-22 ENCOUNTER — Other Ambulatory Visit (HOSPITAL_COMMUNITY): Payer: Self-pay

## 2022-05-22 VITALS — BP 122/82 | HR 73 | Ht 62.0 in | Wt 158.0 lb

## 2022-05-22 DIAGNOSIS — G2589 Other specified extrapyramidal and movement disorders: Secondary | ICD-10-CM | POA: Diagnosis not present

## 2022-05-22 DIAGNOSIS — M9903 Segmental and somatic dysfunction of lumbar region: Secondary | ICD-10-CM

## 2022-05-22 DIAGNOSIS — M9908 Segmental and somatic dysfunction of rib cage: Secondary | ICD-10-CM

## 2022-05-22 DIAGNOSIS — M9901 Segmental and somatic dysfunction of cervical region: Secondary | ICD-10-CM | POA: Diagnosis not present

## 2022-05-22 DIAGNOSIS — M9904 Segmental and somatic dysfunction of sacral region: Secondary | ICD-10-CM

## 2022-05-22 DIAGNOSIS — M9902 Segmental and somatic dysfunction of thoracic region: Secondary | ICD-10-CM | POA: Diagnosis not present

## 2022-05-22 MED ORDER — LEVOTHYROXINE SODIUM 88 MCG PO TABS
ORAL_TABLET | ORAL | 2 refills | Status: DC
  Filled 2022-05-22: qty 90, 90d supply, fill #0
  Filled 2022-08-29: qty 90, 90d supply, fill #1
  Filled 2022-12-02: qty 90, 90d supply, fill #2

## 2022-05-22 NOTE — Assessment & Plan Note (Signed)
Scapular dyskinesis noted.  Discussed posture and ergonomics, discussed which activities to do and which ones to avoid.  Patient does respond well to osteopathic manipulation.  Discussed different medications such as nabumetone previously.  Patient is not taking it on a regular basis at the moment.  Follow-up again in 6 to 8 weeks

## 2022-05-22 NOTE — Patient Instructions (Signed)
Good to see you See me in 6-8 weeks 

## 2022-05-23 ENCOUNTER — Other Ambulatory Visit (HOSPITAL_COMMUNITY): Payer: Self-pay

## 2022-05-24 NOTE — Progress Notes (Signed)
error 

## 2022-06-05 ENCOUNTER — Other Ambulatory Visit (HOSPITAL_COMMUNITY): Payer: Self-pay

## 2022-06-10 ENCOUNTER — Other Ambulatory Visit: Payer: Self-pay

## 2022-06-26 ENCOUNTER — Other Ambulatory Visit: Payer: Self-pay | Admitting: Obstetrics and Gynecology

## 2022-06-26 ENCOUNTER — Other Ambulatory Visit (HOSPITAL_COMMUNITY): Payer: Self-pay

## 2022-06-26 DIAGNOSIS — Z79890 Hormone replacement therapy (postmenopausal): Secondary | ICD-10-CM

## 2022-06-26 MED ORDER — ESTRADIOL 0.05 MG/24HR TD PTTW
MEDICATED_PATCH | TRANSDERMAL | 0 refills | Status: DC
  Filled 2022-06-26: qty 8, 28d supply, fill #0

## 2022-06-26 MED ORDER — PROGESTERONE 200 MG PO CAPS
200.0000 mg | ORAL_CAPSULE | Freq: Every evening | ORAL | 0 refills | Status: DC
  Filled 2022-06-26: qty 30, 30d supply, fill #0

## 2022-06-26 NOTE — Telephone Encounter (Signed)
Annual exam 02/28/22? Mammogram 12/2021

## 2022-06-27 ENCOUNTER — Other Ambulatory Visit (HOSPITAL_COMMUNITY): Payer: Self-pay

## 2022-06-27 NOTE — Progress Notes (Signed)
Shelly [name] [place] [address] [place] Phone: [phone] Subjective:   Shelly [name], am serving as a scribe for Dr. Hulan [name].  I'm seeing this patient by the request  of:  [name], Shelly A, NP  CC: back and neck pain follow up   [id number]  Shelly [name] is a 61 y.o. female coming in with complaint of back and neck pain. OMT on 05/22/2022. Patient states doing well. No new issues.  Medications patient has been prescribed: None  Taking:         Reviewed prior external information including notes and imaging from previsou exam, outside providers and external EMR if available.   As well as notes that were available from care everywhere and other healthcare systems.  Past medical history, social, surgical and family history all reviewed in electronic medical record.  No pertanent information unless stated regarding to the chief complaint.   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 POSITIVE muscle aches  Objective  Blood pressure 122/74,  pulse 68, height '5\' 2"'$  (1.575 m), weight 162 lb (73.5 kg), last menstrual period 02/22/2014, SpO2 99 %.   General: No apparent distress alert and oriented x3 mood and affect normal, dressed appropriately.  HEENT: Pupils equal, extraocular movements intact  Respiratory: Patient's speak in full sentences and does not appear short of breath  Cardiovascular: No lower extremity edema, non tender, no erythema  Gait normal  MSK:  Back: Tenderness noted in the parascapular region right greater than left.  Patient does have limited sidebending.  Patient does have tightness with FABER test right greater than left.  Osteopathic findings  C2 flexed rotated and side bent right C6 flexed rotated and side bent left T3 extended rotated and side bent right inhaled rib T6 extended rotated and side bent left L1 flexed rotated and side bent right Sacrum right on right     Assessment and Plan:  Scapular dyskinesis Responding extremely well to osteopathic manipulation.  Does have nabumetone for the exacerbations.  Discussed icing regimen and home exercises.  Follow-up again in 6 to 8 weeks    Nonallopathic problems  Decision today to treat with OMT was based on Physical Exam  After verbal consent patient was treated with HVLA, ME, FPR techniques in cervical, rib, thoracic, lumbar, and sacral  areas  Patient tolerated the procedure well with improvement in symptoms  Patient given exercises, stretches and lifestyle modifications  See medications in patient instructions if given  Patient will follow up in 4-8 weeks  The above documentation has been reviewed and is accurate and complete Shelly Pulley, DO          Note: This dictation was prepared with Dragon dictation along with smaller phrase technology. Any transcriptional errors that result from this process are unintentional.

## 2022-07-04 ENCOUNTER — Ambulatory Visit: Payer: 59 | Admitting: Family Medicine

## 2022-07-04 VITALS — BP 122/74 | HR 68 | Ht 62.0 in | Wt 162.0 lb

## 2022-07-04 DIAGNOSIS — G2589 Other specified extrapyramidal and movement disorders: Secondary | ICD-10-CM | POA: Diagnosis not present

## 2022-07-04 DIAGNOSIS — M9908 Segmental and somatic dysfunction of rib cage: Secondary | ICD-10-CM

## 2022-07-04 DIAGNOSIS — M9904 Segmental and somatic dysfunction of sacral region: Secondary | ICD-10-CM | POA: Diagnosis not present

## 2022-07-04 DIAGNOSIS — M9901 Segmental and somatic dysfunction of cervical region: Secondary | ICD-10-CM | POA: Diagnosis not present

## 2022-07-04 DIAGNOSIS — M9902 Segmental and somatic dysfunction of thoracic region: Secondary | ICD-10-CM

## 2022-07-04 DIAGNOSIS — M9903 Segmental and somatic dysfunction of lumbar region: Secondary | ICD-10-CM

## 2022-07-04 NOTE — Assessment & Plan Note (Signed)
Responding extremely well to osteopathic manipulation.  Does have nabumetone for the exacerbations.  Discussed icing regimen and home exercises.  Follow-up again in 6 to 8 weeks

## 2022-07-04 NOTE — Patient Instructions (Signed)
Good to see you! Enjoy the fall

## 2022-07-09 ENCOUNTER — Other Ambulatory Visit (HOSPITAL_COMMUNITY): Payer: Self-pay

## 2022-07-09 ENCOUNTER — Encounter: Payer: Self-pay | Admitting: Obstetrics and Gynecology

## 2022-07-09 ENCOUNTER — Ambulatory Visit: Payer: 59 | Admitting: Obstetrics and Gynecology

## 2022-07-09 VITALS — BP 128/72 | HR 77 | Ht 62.5 in | Wt 162.0 lb

## 2022-07-09 DIAGNOSIS — Z79890 Hormone replacement therapy (postmenopausal): Secondary | ICD-10-CM | POA: Diagnosis not present

## 2022-07-09 DIAGNOSIS — E039 Hypothyroidism, unspecified: Secondary | ICD-10-CM | POA: Diagnosis not present

## 2022-07-09 DIAGNOSIS — N950 Postmenopausal bleeding: Secondary | ICD-10-CM

## 2022-07-09 MED ORDER — MEDROXYPROGESTERONE ACETATE 5 MG PO TABS
5.0000 mg | ORAL_TABLET | Freq: Every day | ORAL | 0 refills | Status: DC
  Filled 2022-07-09: qty 90, 90d supply, fill #0

## 2022-07-09 NOTE — Progress Notes (Signed)
GYNECOLOGY  VISIT   HPI: 61 y.o.   Married  Caucasian  female   [id number] with Patient's last menstrual period was 02/22/2014.   here for medication follow up.   Patient has had postmenopausal bleeding on HRT.  Her US showed intramural fibroids.  Her EMB showed proliferative endometrium with glandular and stromal breakdown.  She is on Vivelle dot 0.05 mg and her Prometrium was increased to 200 mg q hs in June.  Some night sweats on her current regiment.  Had bleeding every 2 weeks for the first month of the new regimen. The month of August, she was spotting more. She bled 7/30 - 8/3 and 8/21 - 8/25. Spotted 8/31 and 9/1.  No bleeding since the 06/24/22.   She does note that a patch was absent when she was showering one day.   She feels better on the hormones than off of the hormones.   GYNECOLOGIC HISTORY: Patient's last menstrual period was 02/22/2014. Contraception:  PMP Menopausal hormone therapy: Vivelle Dot 0.'05mg'$ , Progesterone '200mg'$  Last mammogram:  01-15-22 Neg/Birad1 Last pap smear:  02-28-22 Neg:Neg HR HPV, 02-14-21 Neg:Neg HR HPV, 04-06-18 Neg:Neg HR HPV         OB History     Gravida  0   Para  0   Term  0   Preterm  0   AB  0   Living  0      SAB  0   IAB  0   Ectopic  0   Multiple  0   Live Births           Obstetric Comments  Has 2 adopted children            Patient Active Problem List   Diagnosis Date Noted   Post-menopausal bleeding 01/02/2022   White coat syndrome with high blood pressure but without hypertension 01/02/2022   Left knee pain 06/01/2021   Aortic atherosclerosis ([place]) 01/25/2021   History of nephrolithiasis 01/19/2021   Anxiety 08/11/2019   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   Cubital tunnel syndrome 10/07/2018   Nonallopathic lesion of rib cage 07/08/2018   Nonallopathic lesion of sacral region 07/08/2018   Osteoarthritis of both hands 08/28/2016   Osteoarthritis of both feet 08/28/2016   Osteoarthritis of both  knees 08/28/2016   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   Vitamin D deficiency 08/28/2016   Chronic neck pain 03/01/2016   Scapular dyskinesis 03/01/2016   Nonallopathic lesion of cervical region 03/01/2016   Nonallopathic lesion of thoracic region 03/01/2016   Nonallopathic lesion of lumbosacral region 03/01/2016   Family history of abdominal aortic aneurysm 08/12/2012   Renal calculus, left 06/04/2012   GERD (gastroesophageal reflux disease) 05/01/2012   History of migraine 05/01/2012   Hypothyroidism 05/01/2012   Leukopenia 05/01/2012   Allergic rhinitis due to allergen 05/01/2012    Past Medical History:  Diagnosis Date   Chronic kidney disease    kidney stones   Depression    Endometriosis    Fibroid    Fracture, tibial plateau    GERD (gastroesophageal reflux disease)    Heart murmur    Hypothyroid    Inflammatory arthritis 08/28/2016   Insomnia 08/28/2016   Migraine    Osteoarthritis of both feet 08/28/2016   Osteoarthritis of both hands 08/28/2016   Osteoarthritis of both knees 08/28/2016   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 Matthew Saras    Current Outpatient Medications  Medication Sig Dispense Refill   Cholecalciferol (VITAMIN D3) 250 MCG (10000 UT) capsule      estradiol (VIVELLE-DOT) 0.05 MG/24HR patch Place 1 patch onto the skin twice a week 8 patch 0   fluticasone (FLONASE) 50 MCG/ACT nasal spray Place 2 sprays into both nostrils daily. 16 g 6   Krill Oil 500 MG CAPS Take 1 capsule by mouth daily.     levothyroxine (SYNTHROID) 88 MCG tablet Take 1 tablet by mouth once daily 90 tablet 2   nabumetone (RELAFEN) 500 MG tablet Take 1 tablet (500 mg total) by mouth daily. 90 tablet 0   pantoprazole (PROTONIX) 40 MG tablet Take 1 tablet (40 mg total) by  mouth daily. 30 tablet 11   progesterone (PROMETRIUM) 200 MG capsule Take 1 capsule (200 mg total) by mouth at bedtime. 30 capsule 0   No current facility-administered medications for this visit.     ALLERGIES: Compazine [prochlorperazine edisylate]  Family History  Problem Relation Age of Onset   Hypertension Mother    Dementia Mother    Hypertension Father    Stroke Father    Heart disease Father    Hypertension Brother    Cancer Maternal Aunt 42       breast   Aneurysm Maternal Aunt    Breast cancer Maternal Aunt    Hypertension Maternal Grandmother    Hypertension Maternal Grandfather    Kidney failure Maternal Grandfather    Aneurysm Maternal Grandfather    Cancer Cousin        breast   Breast cancer Cousin    Colon polyps Neg Hx    Esophageal cancer Neg Hx    Pancreatic cancer Neg Hx    Stomach cancer Neg Hx     Social History   Socioeconomic History   Marital status: Married    Spouse name: Not on file   Number of children: 2   Years of education: Not on file   Highest education level: Not on file  Occupational History   Occupation: Programmer, multimedia: [employer]  Tobacco Use   Smoking status: Never   Smokeless tobacco: Never  Vaping Use   Vaping Use: Never used  Substance and Sexual Activity   Alcohol use: No   Drug use: No   Sexual activity: Yes    Partners: Male    Birth control/protection: None  Other Topics Concern   Not on file  Social History Narrative   Not on file   Social Determinants of Health   Financial Resource Strain: Not on file  Food Insecurity: Not on file  Transportation Needs: Not on file  Physical Activity: Not on file  Stress: Not on file  Social Connections: Not on file  Intimate Partner Violence: Not on file    Review of Systems  All other systems reviewed and are negative.   PHYSICAL EXAMINATION:    BP 128/72   Pulse 77   Ht 5' 2.5" (1.588 m)   Wt 162 lb (73.5 kg)   LMP 02/22/2014   SpO2 98%   BMI  29.16 kg/m     General appearance: alert, cooperative and appears stated age   ASSESSMENT  Postmenopausal bleeding on HRT.   Hypothyroidism.   PLAN  Continue Vivelle Dot 0.05 mg twice weekly.  Stop Prometrium.  Start  Provera 5 mg daily.  Will check her TFTs today. If her bleeding persists, I recommend a sonohysterogram, which she currently declines.  FU in 3 months for a recheck.    An After Visit Summary was printed and given to the patient.  24 min  total time was spent for this patient encounter, including preparation, face-to-face counseling with the patient, coordination of care, and documentation of the encounter.

## 2022-07-10 ENCOUNTER — Other Ambulatory Visit (HOSPITAL_COMMUNITY): Payer: Self-pay

## 2022-07-10 LAB — TSH: TSH: 2.17 mIU/L (ref 0.40–4.50)

## 2022-07-10 LAB — T4, FREE: Free T4: 1.5 ng/dL (ref 0.8–1.8)

## 2022-07-11 DIAGNOSIS — Z87442 Personal history of urinary calculi: Secondary | ICD-10-CM | POA: Diagnosis not present

## 2022-07-11 DIAGNOSIS — N201 Calculus of ureter: Secondary | ICD-10-CM | POA: Diagnosis not present

## 2022-07-25 ENCOUNTER — Telehealth: Payer: Self-pay | Admitting: *Deleted

## 2022-07-25 ENCOUNTER — Other Ambulatory Visit (HOSPITAL_COMMUNITY): Payer: Self-pay

## 2022-07-25 DIAGNOSIS — N950 Postmenopausal bleeding: Secondary | ICD-10-CM

## 2022-07-25 DIAGNOSIS — Z79890 Hormone replacement therapy (postmenopausal): Secondary | ICD-10-CM

## 2022-07-25 MED ORDER — MEDROXYPROGESTERONE ACETATE 10 MG PO TABS
10.0000 mg | ORAL_TABLET | Freq: Every day | ORAL | 1 refills | Status: DC
  Filled 2022-07-25: qty 30, 30d supply, fill #0
  Filled 2022-08-29: qty 30, 30d supply, fill #1

## 2022-07-25 MED ORDER — ESTRADIOL 0.05 MG/24HR TD PTTW
MEDICATED_PATCH | TRANSDERMAL | 1 refills | Status: DC
Start: 2022-07-25 — End: 2022-10-01
  Filled 2022-07-25: qty 8, 28d supply, fill #0
  Filled 2022-08-29: qty 8, 28d supply, fill #1

## 2022-07-25 NOTE — Telephone Encounter (Signed)
Patient scheduled on 08/28/22 for [facility]

## 2022-07-25 NOTE — Telephone Encounter (Signed)
Patient informed. Rx sent, she also needed refill on vivielle dot patch. Rx sent. Message sent to appointment to schedule [facility].

## 2022-07-25 NOTE — Telephone Encounter (Signed)
I recommend increasing her Provera to 10 mg daily.  #30, RF:  one.  Please also schedule a sonohysterogram with me in the office.

## 2022-07-25 NOTE — Telephone Encounter (Signed)
Patient called to reports bleeding started again last night. Recently started on provera 5 mg tablet on 07/09/22, bleeding is not heavy, medium flow.  Patient was told to call and relay if this should occur again. Please advise

## 2022-07-26 ENCOUNTER — Other Ambulatory Visit (HOSPITAL_COMMUNITY): Payer: Self-pay

## 2022-08-07 NOTE — Progress Notes (Signed)
Shelly [name] [address] [place] Phone: [phone] Subjective:   Shelly [name], am serving as a scribe for Dr. Hulan Saas.  I'm seeing this patient by the request  of:  McElwee, Lauren A, NP  CC: back and neck pain   NTZ:[id number]  Shelly [name] is a 61 y.o. female coming in with complaint of back and neck pain. OMT on 05/22/2022. Patient states doing well. Left side of neck is bothersome. No other complaints.  Medications patient has been prescribed: None  Taking:         Reviewed prior external information including notes and imaging from previsou exam, outside providers and external EMR if available.   As well as notes that were available from care everywhere and other healthcare systems.  Past medical history, social, surgical and family history all reviewed in electronic medical record.  No pertanent information unless stated regarding to the chief complaint.   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 POSITIVE muscle  aches  Objective  Blood pressure 136/84, pulse 79, height '5\' 2"'$  (1.575 m), weight 164 lb (74.4 kg), last menstrual period 02/22/2014, SpO2 99 %.   General: No apparent distress alert and oriented x3 mood and affect normal, dressed appropriately.  HEENT: Pupils equal, extraocular movements intact  Respiratory: Patient's speak in full sentences and does not appear short of breath  Cardiovascular: No lower extremity edema, non tender, no erythema  Gait MSK:  Back does have some loss of lordosis.  Still more tightness in the right scapular area.  Seems to be more muscular noted below.  Osteopathic findings  C3 flexed rotated and side bent right C7 flexed rotated and side bent left T3 extended rotated and side bent right inhaled rib T8 extended rotated and side bent left L3 flexed rotated and side bent right Sacrum right on right       Assessment and Plan:  Scapular dyskinesis Patient does have scapular dyskinesis noted.  He does have some tightness noted in the neck bilaterally.  Patient has negative Spurling's today.  Responds well though to osteopathic manipulation.  Relafen 500 mg daily has been prescribed previously.  Follow-up again in 6 to 8 weeks    Nonallopathic problems  Decision today to treat with OMT was based on Physical Exam  After verbal consent patient was treated with HVLA, ME, FPR techniques in cervical, rib, thoracic, lumbar, and sacral  areas  Patient tolerated the procedure well with improvement in symptoms  Patient given exercises, stretches and lifestyle modifications  See medications in patient instructions if given  Patient will follow up in 4-8 weeks for further evaluation and management of the primary problem and discussed     The above documentation has been reviewed and is accurate and complete Shelly Pulley, DO         Note: This dictation was prepared with Dragon dictation along with smaller phrase technology. Any transcriptional  errors that result from this process are unintentional.

## 2022-08-13 ENCOUNTER — Ambulatory Visit: Payer: 59 | Admitting: Family Medicine

## 2022-08-13 ENCOUNTER — Other Ambulatory Visit (HOSPITAL_COMMUNITY): Payer: Self-pay

## 2022-08-13 VITALS — BP 136/84 | HR 79 | Ht 62.0 in | Wt 164.0 lb

## 2022-08-13 DIAGNOSIS — M9903 Segmental and somatic dysfunction of lumbar region: Secondary | ICD-10-CM | POA: Diagnosis not present

## 2022-08-13 DIAGNOSIS — M9901 Segmental and somatic dysfunction of cervical region: Secondary | ICD-10-CM | POA: Diagnosis not present

## 2022-08-13 DIAGNOSIS — M9904 Segmental and somatic dysfunction of sacral region: Secondary | ICD-10-CM

## 2022-08-13 DIAGNOSIS — M9908 Segmental and somatic dysfunction of rib cage: Secondary | ICD-10-CM

## 2022-08-13 DIAGNOSIS — M9902 Segmental and somatic dysfunction of thoracic region: Secondary | ICD-10-CM

## 2022-08-13 DIAGNOSIS — G2589 Other specified extrapyramidal and movement disorders: Secondary | ICD-10-CM

## 2022-08-13 MED ORDER — BACLOFEN 10 MG PO TABS
10.0000 mg | ORAL_TABLET | Freq: Three times a day (TID) | ORAL | 0 refills | Status: AC | PRN
  Filled 2022-08-13: qty 90, 30d supply, fill #0

## 2022-08-13 NOTE — Patient Instructions (Signed)
Good to see you! Stay active

## 2022-08-13 NOTE — Assessment & Plan Note (Signed)
Patient does have scapular dyskinesis noted.  He does have some tightness noted in the neck bilaterally.  Patient has negative Spurling's today.  Responds well though to osteopathic manipulation.  Relafen 500 mg daily has been prescribed previously.  Follow-up again in 6 to 8 weeks

## 2022-08-14 ENCOUNTER — Other Ambulatory Visit (HOSPITAL_COMMUNITY): Payer: Self-pay

## 2022-08-15 ENCOUNTER — Other Ambulatory Visit (HOSPITAL_COMMUNITY): Payer: Self-pay

## 2022-08-15 DIAGNOSIS — D1801 Hemangioma of skin and subcutaneous tissue: Secondary | ICD-10-CM | POA: Diagnosis not present

## 2022-08-15 DIAGNOSIS — L814 Other melanin hyperpigmentation: Secondary | ICD-10-CM | POA: Diagnosis not present

## 2022-08-15 DIAGNOSIS — L821 Other seborrheic keratosis: Secondary | ICD-10-CM | POA: Diagnosis not present

## 2022-08-15 DIAGNOSIS — E663 Overweight: Secondary | ICD-10-CM | POA: Diagnosis not present

## 2022-08-15 DIAGNOSIS — X32XXXS Exposure to sunlight, sequela: Secondary | ICD-10-CM | POA: Diagnosis not present

## 2022-08-15 DIAGNOSIS — L0109 Other impetigo: Secondary | ICD-10-CM | POA: Diagnosis not present

## 2022-08-15 DIAGNOSIS — D225 Melanocytic nevi of trunk: Secondary | ICD-10-CM | POA: Diagnosis not present

## 2022-08-15 DIAGNOSIS — L2089 Other atopic dermatitis: Secondary | ICD-10-CM | POA: Diagnosis not present

## 2022-08-15 MED ORDER — TRIAMCINOLONE ACETONIDE 0.1 % EX CREA
TOPICAL_CREAM | Freq: Two times a day (BID) | CUTANEOUS | 0 refills | Status: DC
  Filled 2022-08-15: qty 80, 14d supply, fill #0

## 2022-08-28 ENCOUNTER — Ambulatory Visit (INDEPENDENT_AMBULATORY_CARE_PROVIDER_SITE_OTHER): Payer: 59

## 2022-08-28 ENCOUNTER — Encounter: Payer: Self-pay | Admitting: Obstetrics and Gynecology

## 2022-08-28 ENCOUNTER — Other Ambulatory Visit: Payer: Self-pay | Admitting: Obstetrics and Gynecology

## 2022-08-28 ENCOUNTER — Ambulatory Visit: Payer: 59 | Admitting: Obstetrics and Gynecology

## 2022-08-28 VITALS — BP 124/80 | HR 78

## 2022-08-28 DIAGNOSIS — N950 Postmenopausal bleeding: Secondary | ICD-10-CM

## 2022-08-28 DIAGNOSIS — N9489 Other specified conditions associated with female genital organs and menstrual cycle: Secondary | ICD-10-CM | POA: Diagnosis not present

## 2022-08-28 DIAGNOSIS — Z79890 Hormone replacement therapy (postmenopausal): Secondary | ICD-10-CM | POA: Diagnosis not present

## 2022-08-28 LAB — CBC
HCT: 42.6 % (ref 35.0–45.0)
Hemoglobin: 13.7 g/dL (ref 11.7–15.5)
MCH: 32 pg (ref 27.0–33.0)
MCHC: 32.2 g/dL (ref 32.0–36.0)
MCV: 99.5 fL (ref 80.0–100.0)
MPV: 11.5 fL (ref 7.5–12.5)
Platelets: 268 10*3/uL (ref 140–400)
RBC: 4.28 10*6/uL (ref 3.80–5.10)
RDW: 12.1 % (ref 11.0–15.0)
WBC: 5.4 10*3/uL (ref 3.8–10.8)

## 2022-08-28 NOTE — Progress Notes (Unsigned)
GYNECOLOGY  VISIT   HPI: 61 y.o.   Married  Caucasian  female   Hissop with Patient's last menstrual period was 02/22/2014.   here for sonohysterogram for postmenopausal bleeding on HRT.  Bleeding like a real period.  She has known fibroids and EMB showed proliferative endometrium.    GYNECOLOGIC HISTORY: Patient's last menstrual period was 02/22/2014. Contraception:  postmenopausal Menopausal hormone therapy: Vivelle Dot 0.'05mg'$ ,  Last mammogram: 01-15-22 Neg/Birad1 Last pap smear:  02-28-22 Neg:Neg HR HPV, 02-14-21 Neg:Neg HR HPV, 04-06-18 Neg:Neg HR HPV         OB History     Gravida  0   Para  0   Term  0   Preterm  0   AB  0   Living  0      SAB  0   IAB  0   Ectopic  0   Multiple  0   Live Births           Obstetric Comments  Has 2 adopted children            Patient Active Problem List   Diagnosis Date Noted   Post-menopausal bleeding 01/02/2022   White coat syndrome with high blood pressure but without hypertension 01/02/2022   Left knee pain 06/01/2021   Aortic atherosclerosis ([place]) 01/25/2021   History of nephrolithiasis 01/19/2021   Anxiety 08/11/2019   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   Cubital tunnel syndrome 10/07/2018   Nonallopathic lesion of rib cage 07/08/2018   Nonallopathic lesion of sacral region 07/08/2018   Osteoarthritis of both hands 08/28/2016   Osteoarthritis of both feet 08/28/2016   Osteoarthritis of both knees 08/28/2016   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   Vitamin D deficiency 08/28/2016   Chronic neck pain 03/01/2016   Scapular dyskinesis 03/01/2016   Nonallopathic lesion of cervical region 03/01/2016   Nonallopathic lesion of thoracic region 03/01/2016   Nonallopathic lesion of lumbosacral region 03/01/2016   Family history of abdominal aortic aneurysm 08/12/2012   Renal calculus, left 06/04/2012   GERD (gastroesophageal reflux disease) 05/01/2012   History of migraine 05/01/2012   Hypothyroidism 05/01/2012    Leukopenia 05/01/2012   Allergic rhinitis due to allergen 05/01/2012    Past Medical History:  Diagnosis Date   Chronic kidney disease    kidney stones   Depression    Endometriosis    Fibroid    Fracture, tibial plateau    GERD (gastroesophageal reflux disease)    Heart murmur    Hypothyroid    Inflammatory arthritis 08/28/2016   Insomnia 08/28/2016   Migraine    Osteoarthritis of both feet 08/28/2016   Osteoarthritis of both hands 08/28/2016   Osteoarthritis of both knees 08/28/2016   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 Matthew Saras    Current Outpatient Medications  Medication Sig Dispense Refill   baclofen (LIORESAL) 10 MG tablet Take 1 tablet (10 mg total) by mouth 3 (three) times daily as needed for muscle pain 90 tablet 0   Cholecalciferol (VITAMIN D3) 250 MCG (10000 UT) capsule      estradiol (VIVELLE-DOT) 0.05 MG/24HR patch Place 1 patch onto the skin twice a week 8 patch 1   fluticasone (FLONASE) 50 MCG/ACT nasal spray Place 2 sprays into both nostrils  daily. 16 g 6   Krill Oil 500 MG CAPS Take 1 capsule by mouth daily.     levothyroxine (SYNTHROID) 88 MCG tablet Take 1 tablet by mouth once daily 90 tablet 2   medroxyPROGESTERone (PROVERA) 10 MG tablet Take 1 tablet (10 mg total) by mouth daily. 30 tablet 1   nabumetone (RELAFEN) 500 MG tablet Take 1 tablet (500 mg total) by mouth daily. 90 tablet 0   pantoprazole (PROTONIX) 40 MG tablet Take 1 tablet (40 mg total) by mouth daily. 30 tablet 11   triamcinolone cream (KENALOG) 0.1 % Apply topically 2 times a day for up to 3 weeks in a row 80 g 0   No current facility-administered medications for this visit.     ALLERGIES: Compazine [prochlorperazine edisylate]  Family History  Problem Relation Age  of Onset   Hypertension Mother    Dementia Mother    Hypertension Father    Stroke Father    Heart disease Father    Hypertension Brother    Cancer Maternal Aunt 42       breast   Aneurysm Maternal Aunt    Breast cancer Maternal Aunt    Hypertension Maternal Grandmother    Hypertension Maternal Grandfather    Kidney failure Maternal Grandfather    Aneurysm Maternal Grandfather    Cancer Cousin        breast   Breast cancer Cousin    Colon polyps Neg Hx    Esophageal cancer Neg Hx    Pancreatic cancer Neg Hx    Stomach cancer Neg Hx     Social History   Socioeconomic History   Marital status: Married    Spouse name: Not on file   Number of children: 2   Years of education: Not on file   Highest education level: Not on file  Occupational History   Occupation: Programmer, multimedia: [employer]  Tobacco Use   Smoking status: Never   Smokeless tobacco: Never  Vaping Use   Vaping Use: Never used  Substance and Sexual Activity   Alcohol use: No   Drug use: No   Sexual activity: Yes    Partners: Male    Birth control/protection: None  Other Topics Concern   Not on file  Social History Narrative   Not on file   Social Determinants of Health   Financial Resource Strain: Not on file  Food Insecurity: Not on file  Transportation Needs: Not on file  Physical Activity: Not on file  Stress: Not on file  Social Connections: Not on file  Intimate Partner Violence: Not on file    Review of Systems  PHYSICAL EXAMINATION:    BP 124/80 (BP Location: Right Arm, Patient Position: Sitting, Cuff Size: Normal)   Pulse 78   LMP 02/22/2014     General appearance: alert, cooperative and appears stated age   Pelvic US Uterus 7.47 x 4.7 x 5.97 cm.   2 intramural fibroids: 2.31 and 2.31 cm.  EMS 3.04 mm.  Left ovary 2.40 x 1.32 x 0.86 cm.  Atrophic.  Right ovary 2.56 x 1.54 x 1.41 cm.  Atrophic.  No adnexal masses.  No free fluid.   Sonohysterogram Sterile prep of  cervix with betadine.  Cannula placed inside uterine cavity and sterile NS injected.  Intracavitary mass 1.1 x 0.2 cm, anterior wall of right cornua.  Then intracavitary membrane notes, possible synechiae.  Chaperone was present for exam:  Ivin Booty, [place] tech  ASSESSMENT  PLAN  CBC.  Proceed with    An After Visit Summary was printed and given to the patient.  ______ minutes face to face time of which over 50% was spent in counseling.

## 2022-08-29 ENCOUNTER — Other Ambulatory Visit (HOSPITAL_COMMUNITY): Payer: Self-pay

## 2022-08-29 ENCOUNTER — Telehealth: Payer: Self-pay | Admitting: Obstetrics and Gynecology

## 2022-08-29 NOTE — Telephone Encounter (Signed)
Please precert and schedule hysteroscopy with Myosure removal of endometrial mass, dilation and curettage at [facility].   Patient has dx of postmenopausal bleeding, HRT treatment, and endometrial mass.   Time needed is 45 minutes.

## 2022-09-04 NOTE — Telephone Encounter (Signed)
Patient didn't answer phone for her patient responsibility. LVM to advise. Will follow up in a day and continue monitoring VM in case of callback.

## 2022-09-04 NOTE — Telephone Encounter (Signed)
Spoke with patient. Reviewed surgery dates. Patient is going to review schedule with spouse and employer and return call later this morning to advise on date.

## 2022-09-04 NOTE — Telephone Encounter (Signed)
Patient returned call requesting to schedule on 09/23/22.   Surgery request sent.   Routing to [facility] for benefits.

## 2022-09-06 NOTE — Telephone Encounter (Signed)
Spoke with patient. Surgery date request confirmed.  Advised surgery is scheduled for 09/23/22, [place] at 10.  Surgery instruction sheet and hospital brochure reviewed, printed copy will be mailed.  Patient verbalizes understanding and is agreeable.  Routing to provider. Encounter closed.

## 2022-09-06 NOTE — Telephone Encounter (Signed)
Shelly [name] (8:43 AM)   Roseanna Rainbow with patient regarding surgery benefits. Patient acknowledges understanding of information presented. Patient is aware that benefits presented are professional benefits only. Patient is aware the hospital will call with facility benefits. See account note.  Routing to Glorianne Manchester, Therapist, sports

## 2022-09-16 ENCOUNTER — Encounter (HOSPITAL_BASED_OUTPATIENT_CLINIC_OR_DEPARTMENT_OTHER): Payer: Self-pay | Admitting: Obstetrics and Gynecology

## 2022-09-17 ENCOUNTER — Encounter (HOSPITAL_BASED_OUTPATIENT_CLINIC_OR_DEPARTMENT_OTHER): Payer: Self-pay | Admitting: Obstetrics and Gynecology

## 2022-09-17 NOTE — Progress Notes (Signed)
Spoke w/ via phone for pre-op interview--- pt Lab needs dos---- no (per anes)/  orders pending              Lab results------ no COVID test -----patient states asymptomatic no test needed Arrive at ------- 0530 on 09-23-2022 NPO after MN NO Solid Food.  Clear liquids from MN until--- 0430 Med rec completed Medications to take morning of surgery ----- protonix, flonase spray Diabetic medication ----- n/a Patient instructed no nail polish to be worn day of surgery Patient instructed to bring photo id and insurance card day of surgery Patient aware to have Driver (ride ) / caregiver for 24 hours after surgery --- husband, david Patient Special Instructions ----- n/a Pre-Op special Istructions ----- sent inbox message to dr Quincy Simmonds, requested orders Patient verbalized understanding of instructions that were given at this phone interview. Patient denies shortness of breath, chest pain, fever, cough at this phone interview.

## 2022-09-20 NOTE — Anesthesia Preprocedure Evaluation (Addendum)
Anesthesia Evaluation  Patient identified by MRN, date of birth, ID band Patient awake    Reviewed: Allergy & Precautions, NPO status , Patient's Chart, lab work & pertinent test results  Airway Mallampati: II  TM Distance: >3 FB Neck ROM: Full    Dental no notable dental hx. (+) Dental Advisory Given   Pulmonary neg pulmonary ROS   Pulmonary exam normal        Cardiovascular hypertension, Normal cardiovascular exam     Neuro/Psych  PSYCHIATRIC DISORDERS Anxiety Depression    negative neurological ROS     GI/Hepatic Neg liver ROS,GERD  ,,  Endo/Other  Hypothyroidism    Renal/GU negative Renal ROS     Musculoskeletal negative musculoskeletal ROS (+)    Abdominal   Peds  Hematology negative hematology ROS (+)   Anesthesia Other Findings   Reproductive/Obstetrics                             Anesthesia Physical Anesthesia Plan  ASA: 2  Anesthesia Plan: General   Post-op Pain Management: Tylenol PO (pre-op)* and Toradol IV (intra-op)*   Induction:   PONV Risk Score and Plan: 4 or greater and Ondansetron, Dexamethasone, Midazolam and Scopolamine patch - Pre-op  Airway Management Planned: LMA  Additional Equipment:   Intra-op Plan:   Post-operative Plan: Extubation in OR  Informed Consent: I have reviewed the patients History and Physical, chart, labs and discussed the procedure including the risks, benefits and alternatives for the proposed anesthesia with the patient or authorized representative who has indicated his/her understanding and acceptance.     Dental advisory given  Plan Discussed with: Anesthesiologist and CRNA  Anesthesia Plan Comments:        Anesthesia Quick Evaluation

## 2022-09-23 ENCOUNTER — Encounter (HOSPITAL_BASED_OUTPATIENT_CLINIC_OR_DEPARTMENT_OTHER): Payer: Self-pay | Admitting: Obstetrics and Gynecology

## 2022-09-23 ENCOUNTER — Encounter (HOSPITAL_BASED_OUTPATIENT_CLINIC_OR_DEPARTMENT_OTHER)
Admission: RE | Disposition: A | Discharge status: Home / Self Care | Payer: Self-pay | Source: Home / Self Care | Attending: Obstetrics and Gynecology

## 2022-09-23 ENCOUNTER — Ambulatory Visit (HOSPITAL_BASED_OUTPATIENT_CLINIC_OR_DEPARTMENT_OTHER): Payer: 59 | Admitting: Anesthesiology

## 2022-09-23 ENCOUNTER — Ambulatory Visit (HOSPITAL_BASED_OUTPATIENT_CLINIC_OR_DEPARTMENT_OTHER)
Admission: RE | Admit: 2022-09-23 | Discharge: 2022-09-23 | Disposition: A | Discharge status: Home / Self Care | Payer: 59 | Attending: Obstetrics and Gynecology | Admitting: Obstetrics and Gynecology

## 2022-09-23 ENCOUNTER — Other Ambulatory Visit (HOSPITAL_COMMUNITY): Payer: Self-pay

## 2022-09-23 DIAGNOSIS — N9489 Other specified conditions associated with female genital organs and menstrual cycle: Secondary | ICD-10-CM

## 2022-09-23 DIAGNOSIS — I10 Essential (primary) hypertension: Secondary | ICD-10-CM | POA: Diagnosis not present

## 2022-09-23 DIAGNOSIS — D251 Intramural leiomyoma of uterus: Secondary | ICD-10-CM | POA: Diagnosis not present

## 2022-09-23 DIAGNOSIS — N950 Postmenopausal bleeding: Secondary | ICD-10-CM | POA: Diagnosis not present

## 2022-09-23 DIAGNOSIS — Z79890 Hormone replacement therapy (postmenopausal): Secondary | ICD-10-CM | POA: Insufficient documentation

## 2022-09-23 DIAGNOSIS — K219 Gastro-esophageal reflux disease without esophagitis: Secondary | ICD-10-CM | POA: Diagnosis not present

## 2022-09-23 DIAGNOSIS — N840 Polyp of corpus uteri: Secondary | ICD-10-CM | POA: Insufficient documentation

## 2022-09-23 DIAGNOSIS — Z01818 Encounter for other preprocedural examination: Secondary | ICD-10-CM

## 2022-09-23 DIAGNOSIS — N838 Other noninflammatory disorders of ovary, fallopian tube and broad ligament: Secondary | ICD-10-CM | POA: Diagnosis not present

## 2022-09-23 DIAGNOSIS — E039 Hypothyroidism, unspecified: Secondary | ICD-10-CM | POA: Insufficient documentation

## 2022-09-23 HISTORY — DX: Segmental and somatic dysfunction of abdomen and other regions: M99.09

## 2022-09-23 HISTORY — DX: Personal history of other diseases of the nervous system and sense organs: Z86.69

## 2022-09-23 HISTORY — DX: Hypothyroidism, unspecified: E03.9

## 2022-09-23 HISTORY — DX: Other specified conditions associated with female genital organs and menstrual cycle: N94.89

## 2022-09-23 HISTORY — DX: Postmenopausal bleeding: N95.0

## 2022-09-23 HISTORY — DX: Personal history of urinary calculi: Z87.442

## 2022-09-23 HISTORY — DX: Leiomyoma of uterus, unspecified: D25.9

## 2022-09-23 HISTORY — PX: DILATATION & CURETTAGE/HYSTEROSCOPY WITH MYOSURE: SHX6511

## 2022-09-23 LAB — BASIC METABOLIC PANEL
Anion gap: 7 (ref 5–15)
BUN: 17 mg/dL (ref 6–20)
CO2: 20 mmol/L — ABNORMAL LOW (ref 22–32)
Calcium: 8.3 mg/dL — ABNORMAL LOW (ref 8.9–10.3)
Chloride: 111 mmol/L (ref 98–111)
Creatinine, Ser: 0.44 mg/dL (ref 0.44–1.00)
GFR, Estimated: >60 mL/min (ref >60)
Glucose, Bld: 103 mg/dL — ABNORMAL HIGH (ref 70–99)
Potassium: 4.5 mmol/L (ref 3.5–5.1)
Sodium: 138 mmol/L (ref 135–145)

## 2022-09-23 LAB — CBC
HCT: 40 % (ref 36.0–46.0)
Hemoglobin: 12.8 g/dL (ref 12.0–15.0)
MCH: 31.6 pg (ref 26.0–34.0)
MCHC: 32 g/dL (ref 30.0–36.0)
MCV: 98.8 fL (ref 80.0–100.0)
Platelets: 283 10*3/uL (ref 150–400)
RBC: 4.05 MIL/uL (ref 3.87–5.11)
RDW: 12.3 % (ref 11.5–15.5)
WBC: 4.9 10*3/uL (ref 4.0–10.5)
nRBC: 0 % (ref 0.0–0.2)

## 2022-09-23 SURGERY — DILATATION & CURETTAGE/HYSTEROSCOPY WITH MYOSURE
Anesthesia: General

## 2022-09-23 MED ORDER — LIDOCAINE HCL (PF) 2 % IJ SOLN
INTRAMUSCULAR | Status: AC
  Filled 2022-09-23: qty 5

## 2022-09-23 MED ORDER — PROPOFOL 10 MG/ML IV BOLUS
INTRAVENOUS | Status: AC
  Filled 2022-09-23: qty 20

## 2022-09-23 MED ORDER — ACETAMINOPHEN 500 MG PO TABS
ORAL_TABLET | ORAL | Status: AC
  Filled 2022-09-23: qty 2

## 2022-09-23 MED ORDER — PROPOFOL 10 MG/ML IV BOLUS
INTRAVENOUS | Status: DC | PRN
  Administered 2022-09-23: 10 mg via INTRAVENOUS

## 2022-09-23 MED ORDER — EPHEDRINE SULFATE-NACL 50-0.9 MG/10ML-% IV SOSY
PREFILLED_SYRINGE | INTRAVENOUS | Status: DC | PRN
  Administered 2022-09-23: 5 mg via INTRAVENOUS

## 2022-09-23 MED ORDER — SODIUM CHLORIDE 0.9 % IR SOLN
Status: DC | PRN
  Administered 2022-09-23: 3000 mL

## 2022-09-23 MED ORDER — LACTATED RINGERS IV SOLN: INTRAVENOUS | Status: DC

## 2022-09-23 MED ORDER — MIDAZOLAM HCL 2 MG/2ML IJ SOLN
INTRAMUSCULAR | Status: AC
  Filled 2022-09-23: qty 2

## 2022-09-23 MED ORDER — POVIDONE-IODINE 10 % EX SWAB: 2.0000 | Freq: Once | CUTANEOUS | Status: DC

## 2022-09-23 MED ORDER — GLYCOPYRROLATE PF 0.2 MG/ML IJ SOSY
PREFILLED_SYRINGE | INTRAMUSCULAR | Status: DC | PRN
  Administered 2022-09-23: .2 mg via INTRAVENOUS

## 2022-09-23 MED ORDER — DEXAMETHASONE SODIUM PHOSPHATE 10 MG/ML IJ SOLN
INTRAMUSCULAR | Status: DC | PRN
  Administered 2022-09-23: 10 mg via INTRAVENOUS

## 2022-09-23 MED ORDER — LIDOCAINE HCL 1 % IJ SOLN
INTRAMUSCULAR | Status: DC | PRN
  Administered 2022-09-23: 10 mL

## 2022-09-23 MED ORDER — PHENYLEPHRINE 80 MCG/ML (10ML) SYRINGE FOR IV PUSH (FOR BLOOD PRESSURE SUPPORT)
PREFILLED_SYRINGE | INTRAVENOUS | Status: DC | PRN
  Administered 2022-09-23: 160 ug via INTRAVENOUS
  Administered 2022-09-23: 80 ug via INTRAVENOUS

## 2022-09-23 MED ORDER — KETOROLAC TROMETHAMINE 30 MG/ML IJ SOLN
INTRAMUSCULAR | Status: DC | PRN
  Administered 2022-09-23: 30 mg via INTRAVENOUS

## 2022-09-23 MED ORDER — ACETAMINOPHEN 500 MG PO TABS
1000.0000 mg | ORAL_TABLET | Freq: Once | ORAL | Status: AC
  Administered 2022-09-23: 1000 mg via ORAL

## 2022-09-23 MED ORDER — FENTANYL CITRATE (PF) 100 MCG/2ML IJ SOLN: 25.0000 ug | INTRAMUSCULAR | Status: DC | PRN

## 2022-09-23 MED ORDER — ONDANSETRON HCL 4 MG/2ML IJ SOLN
INTRAMUSCULAR | Status: DC | PRN
  Administered 2022-09-23: 4 mg via INTRAVENOUS

## 2022-09-23 MED ORDER — EPHEDRINE 5 MG/ML INJ
INTRAVENOUS | Status: AC
  Filled 2022-09-23: qty 5

## 2022-09-23 MED ORDER — GLYCOPYRROLATE PF 0.2 MG/ML IJ SOSY
PREFILLED_SYRINGE | INTRAMUSCULAR | Status: AC
  Filled 2022-09-23: qty 1

## 2022-09-23 MED ORDER — FENTANYL CITRATE (PF) 100 MCG/2ML IJ SOLN
INTRAMUSCULAR | Status: AC
  Filled 2022-09-23: qty 2

## 2022-09-23 MED ORDER — SCOPOLAMINE 1 MG/3DAYS TD PT72
1.0000 | MEDICATED_PATCH | TRANSDERMAL | Status: DC
  Administered 2022-09-23: 1.5 mg via TRANSDERMAL

## 2022-09-23 MED ORDER — PHENYLEPHRINE 80 MCG/ML (10ML) SYRINGE FOR IV PUSH (FOR BLOOD PRESSURE SUPPORT)
PREFILLED_SYRINGE | INTRAVENOUS | Status: AC
  Filled 2022-09-23: qty 10

## 2022-09-23 MED ORDER — SCOPOLAMINE 1 MG/3DAYS TD PT72
MEDICATED_PATCH | TRANSDERMAL | Status: AC
  Filled 2022-09-23: qty 1

## 2022-09-23 MED ORDER — LIDOCAINE 2% (20 MG/ML) 5 ML SYRINGE
INTRAMUSCULAR | Status: DC | PRN
  Administered 2022-09-23: 60 mg via INTRAVENOUS

## 2022-09-23 MED ORDER — DEXAMETHASONE SODIUM PHOSPHATE 10 MG/ML IJ SOLN
INTRAMUSCULAR | Status: AC
  Filled 2022-09-23: qty 1

## 2022-09-23 MED ORDER — AMISULPRIDE (ANTIEMETIC) 5 MG/2ML IV SOLN: 10.0000 mg | Freq: Once | INTRAVENOUS | Status: DC | PRN

## 2022-09-23 MED ORDER — FENTANYL CITRATE (PF) 100 MCG/2ML IJ SOLN
INTRAMUSCULAR | Status: DC | PRN
  Administered 2022-09-23 (×2): 50 ug via INTRAVENOUS

## 2022-09-23 MED ORDER — ONDANSETRON HCL 4 MG/2ML IJ SOLN
INTRAMUSCULAR | Status: AC
  Filled 2022-09-23: qty 2

## 2022-09-23 MED ORDER — IBUPROFEN 800 MG PO TABS
800.0000 mg | ORAL_TABLET | Freq: Three times a day (TID) | ORAL | 0 refills | Status: DC | PRN
  Filled 2022-09-23: qty 30, 10d supply, fill #0

## 2022-09-23 MED ORDER — MIDAZOLAM HCL 5 MG/5ML IJ SOLN
INTRAMUSCULAR | Status: DC | PRN
  Administered 2022-09-23: 2 mg via INTRAVENOUS

## 2022-09-23 SURGICAL SUPPLY — 18 items
CATH ROBINSON RED A/P 16FR (CATHETERS) ×1 IMPLANT
DEVICE MYOSURE LITE (MISCELLANEOUS) IMPLANT
DEVICE MYOSURE REACH (MISCELLANEOUS) IMPLANT
DILATOR CANAL MILEX (MISCELLANEOUS) IMPLANT
DRSG TELFA 3X8 NADH STRL (GAUZE/BANDAGES/DRESSINGS) ×1 IMPLANT
GAUZE 4X4 16PLY ~~LOC~~+RFID DBL (SPONGE) ×2 IMPLANT
GLOVE BIO SURGEON STRL SZ 6.5 (GLOVE) ×1 IMPLANT
GOWN STRL REUS W/TWL LRG LVL3 (GOWN DISPOSABLE) ×1 IMPLANT
IV NS IRRIG 3000ML ARTHROMATIC (IV SOLUTION) ×1 IMPLANT
KIT PROCEDURE FLUENT (KITS) ×1 IMPLANT
KIT TURNOVER CYSTO (KITS) ×1 IMPLANT
MYOSURE XL FIBROID (MISCELLANEOUS)
PACK VAGINAL MINOR WOMEN LF (CUSTOM PROCEDURE TRAY) ×1 IMPLANT
PAD OB MATERNITY 4.3X12.25 (PERSONAL CARE ITEMS) ×1 IMPLANT
SEAL CERVICAL OMNI LOK (ABLATOR) IMPLANT
SEAL ROD LENS SCOPE MYOSURE (ABLATOR) ×1 IMPLANT
SYSTEM TISS REMOVAL MYOSURE XL (MISCELLANEOUS) IMPLANT
TOWEL OR 17X26 10 PK STRL BLUE (TOWEL DISPOSABLE) ×1 IMPLANT

## 2022-09-23 NOTE — Anesthesia Procedure Notes (Signed)
Procedure Name: LMA Insertion Date/Time: 09/23/2022 7:31 AM  Performed by: Rogers Blocker, CRNAPre-anesthesia Checklist: Patient identified, Emergency Drugs available, Suction available and Patient being monitored Patient Re-evaluated:Patient Re-evaluated prior to induction Oxygen Delivery Method: Circle System Utilized Preoxygenation: Pre-oxygenation with 100% oxygen Induction Type: IV induction Ventilation: Mask ventilation without difficulty LMA: LMA inserted LMA Size: 4.0 Number of attempts: 1 Placement Confirmation: positive ETCO2 Tube secured with: Tape Dental Injury: Teeth and Oropharynx as per pre-operative assessment

## 2022-09-23 NOTE — Discharge Instructions (Addendum)
Hi Brendalyn,   I found and removed several small polypoid areas.   Everything went well today!  I will see you in the office in about 2 weeks.   Josefa Half, MD       No acetaminophen/Tylenol until after 12:00pm today if needed for pain.   No ibuprofen, Advil, Aleve, Motrin, ketorolac, meloxicam, naproxen, or other NSAIDS until after 2:00pm today if needed for pain.      Post Anesthesia Home Care Instructions  Activity: Get plenty of rest for the remainder of the day. A responsible individual must stay with you for 24 hours following the procedure.  For the next 24 hours, DO NOT: -Drive a car -Paediatric nurse -Drink alcoholic beverages -Take any medication unless instructed by your physician -Make any legal decisions or sign important papers.  Meals: Start with liquid foods such as gelatin or soup. Progress to regular foods as tolerated. Avoid greasy, spicy, heavy foods. If nausea and/or vomiting occur, drink only clear liquids until the nausea and/or vomiting subsides. Call your physician if vomiting continues.  Special Instructions/Symptoms: Your throat may feel dry or sore from the anesthesia or the breathing tube placed in your throat during surgery. If this causes discomfort, gargle with warm salt water. The discomfort should disappear within 24 hours.  If you had a scopolamine patch placed behind your ear for the management of post- operative nausea and/or vomiting:  1. The medication in the patch is effective for 72 hours, after which it should be removed.  Wrap patch in a tissue and discard in the trash. Wash hands thoroughly with soap and water. 2. You may remove the patch earlier than 72 hours if you experience unpleasant side effects which may include dry mouth, dizziness or visual disturbances. 3. Avoid touching the patch. Wash your hands with soap and water after contact with the patch.

## 2022-09-23 NOTE — Op Note (Signed)
OPERATIVE REPORT   PREOPERATIVE DIAGNOSES:   Postmenopausal bleeding, endometrial mass  POSTOPERATIVE DIAGNOSES:   Postmenopausal bleeding, endometrial masses   PROCEDURE:  Hysteroscopy with dilation and curettage and Myosure resection of endometrial masses  SURGEON:  Lenard Galloway, MD  ANESTHESIA:  LMA, paracervical block with 10 mL of 1% lidocaine.  IV FLUIDS:   700 cc LR  EBL:   10 cc  URINE OUTPUT:   300 cc  NORMAL SALINE DEFICIT:   725 cc  COMPLICATIONS:  None.  INDICATIONS FOR THE PROCEDURE:     The patient is a 61 year old G53P0 Caucasian female with recurrent postmenopausal bleeding on hormone replacement therapy.  Her endometrial biopsy showed proliferative endometrium.  Pelvic ultrasound showed two intramural fibroids each measuring 2.3 cm.  Her endometrium measured 3.04 mm.  Her ovaries were atrophic and normal.  Sonohysterogram revealed a 1.1 x 0.2 cm mass of the anterior right cornual region.  Possible synechia was noted as well.  A plan is now made to proceed with a hysteroscopy with dilation and curettage and Myosurgical resection of endometrial mass; after risks, benefits and alternatives were reviewed.  FINDINGS:  Exam under anesthesia revealed a small anteverted, mobile uterus.  No adnexal masses were noted.  The uterus was sounded to 7 cm. Hysteroscopy showed multiple small polypoid areas scattered throughout the cavity.   The tubal ostia regions were normal.   Endometrial currettings were scant.   SPECIMENS:   The endometrial polys and the endometrial curettings were sent to Pathology separately.   PROCEDURE IN DETAIL:  The patient was reidentified in the preoperative hold area.  She received TED hose and PAS stockings for DVT prophylaxis.  In the operating room, the patient was placed in the dorsal lithotomy position and then an LMA anesthetic was introduced.  The patient's lower abdomen, vagina and perineum were sterilely prepped with Betadine, and  the  patient's bladder was catheterized of urine.  She was sterilly draped.  An exam under anesthesia was performed.  A speculum was placed inside the vagina and a single-tooth tenaculum was placed on the anterior cervical lip.  A paracervical block was performed with a total of 10 mL of 1% lidocaine plain.  The uterus was sounded. The cervix was dilated to a #19 Pratt dilator.  The MyoSure hysteroscope was then inserted inside the uterine cavity under the continuous infusion of normal saline solution.   Findings are as noted above.  The Myosure Reach device was used to resect the endometrial polypoid areas without difficulty.  The specimen was sent to Pathology. The MyoSure hysteroscope was removed. The serrated and then sharp curettes were introduced into the uterine cavity and the endometrium was curetted in all 4 quadrants.   A scant amount of endometrial curettings was obtained.  This specimen was sent to Pathology.  The single-tooth tenaculum which had been placed on the anterior cervical lip was removed.     Hemostasis was good, and all of the vaginal instruments were removed.  The patient was awakened and escorted to the recovery room in stable condition after she was cleansed of Betadine.  There were no complications to the procedure.   All needle, instrument and sponge counts were correct.  Lenard Galloway, MD

## 2022-09-23 NOTE — Anesthesia Postprocedure Evaluation (Signed)
Anesthesia Post Note  Patient: Shelly [name]  Procedure(s) Performed: DILATATION & CURETTAGE/HYSTEROSCOPY WITH [place]     Patient location during evaluation: PACU Anesthesia Type: General Level of consciousness: sedated Pain management: pain level controlled Vital Signs Assessment: post-procedure vital signs reviewed and stable Respiratory status: spontaneous breathing and respiratory function stable Cardiovascular status: stable Postop Assessment: no apparent nausea or vomiting Anesthetic complications: no   No notable events documented.  Last Vitals:  Vitals:   09/23/22 0830 09/23/22 0848  BP: 127/72 139/80  Pulse: 79 77  Resp: 13 14  Temp: 36.6 C 36.6 C  SpO2: 95% 99%    Last Pain:  Vitals:   09/23/22 0848  TempSrc:   PainSc: 0-No pain                 [name]

## 2022-09-23 NOTE — Progress Notes (Signed)
Update to History and Physical  No marked change in status since office pre-op visit.   Patient examined.   OK to proceed with surgery. 

## 2022-09-23 NOTE — H&P (Signed)
Office Visit  08/28/2022 [facility], [place] Obstetrics and Gynecology Postmenopausal bleeding +2 more Dx Referred by Charyl Dancer, NP Reason for Visit   Additional Documentation  Vitals:  BP 124/80 (BP Location: Right Arm, Patient Position: Sitting, Cuff Size: Normal) Pulse 78 LMP 02/22/2014  Flowsheets:  NEWS, MEWS Score, Method of Visit   Encounter Info:  Billing Info, History, Allergies, Detailed Report    All Notes   Progress Notes by Nunzio Cobbs, MD at 08/28/2022 3:00 PM  Author: Nunzio Cobbs, MD Author Type: Physician Filed: 08/29/2022  1:38 PM  Note Status: Signed Cosign: Cosign Not Required Encounter Date: 08/28/2022  Editor: Nunzio Cobbs, MD (Physician)      Prior Versions: 1. Enrigue Catena, [place] (Scientist, research (life sciences)) at 08/28/2022  3:36 PM - Sign when Signing Visit   2. Nunzio Cobbs, MD (Physician) at 08/28/2022  3:13 PM - Sign when Signing Visit   3. Enrigue Catena, RMA (Scientist, research (life sciences)) at 08/28/2022  2:54 PM - Sign when Signing Visit  GYNECOLOGY  VISIT   HPI: 61 y.o.   Married  Caucasian  female   [place] with Patient's last menstrual period was 02/22/2014.   here for sonohysterogram for postmenopausal bleeding on HRT.  Bleeding like a real period.   She has known fibroids and EMB showed proliferative endometrium 04/02/22.   GYNECOLOGIC HISTORY: Patient's last menstrual period was 02/22/2014. Contraception:  postmenopausal Menopausal hormone therapy: Vivelle Dot 0.'05mg'$ , Provera 10 mg daily.  Last mammogram: 01-15-22 Neg/Birad1 Last pap smear:  02-28-22 Neg:Neg HR HPV, 02-14-21 Neg:Neg HR HPV, 04-06-18 Neg:Neg HR HPV         OB History       Gravida  0   Para  0   Term  0   Preterm  0   AB  0   Living  0        SAB  0   IAB  0   Ectopic  0   Multiple  0   Live Births             Obstetric Comments  Has 2  adopted children                   Patient Active Problem List    Diagnosis Date Noted   Post-menopausal bleeding 01/02/2022   White coat syndrome with high blood pressure but without hypertension 01/02/2022   Left knee pain 06/01/2021   Aortic atherosclerosis ([place]) 01/25/2021   History of nephrolithiasis 01/19/2021   Anxiety 08/11/2019   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   Cubital tunnel syndrome 10/07/2018   Nonallopathic lesion of rib cage 07/08/2018   Nonallopathic lesion of sacral region 07/08/2018   Osteoarthritis of both hands 08/28/2016   Osteoarthritis of both feet 08/28/2016   Osteoarthritis of both knees 08/28/2016   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   Vitamin D deficiency 08/28/2016   Chronic neck pain 03/01/2016   Scapular dyskinesis 03/01/2016   Nonallopathic lesion of cervical region 03/01/2016   Nonallopathic lesion of thoracic region 03/01/2016   Nonallopathic lesion of lumbosacral region 03/01/2016   Family history of abdominal aortic aneurysm 08/12/2012   Renal calculus, left 06/04/2012   GERD (gastroesophageal reflux disease) 05/01/2012   History of migraine 05/01/2012   Hypothyroidism 05/01/2012   Leukopenia 05/01/2012   Allergic rhinitis due to allergen 05/01/2012  Past Medical History:  Diagnosis Date   Chronic kidney disease      kidney stones   Depression     Endometriosis     Fibroid     Fracture, tibial plateau     GERD (gastroesophageal reflux disease)     Heart murmur     Hypothyroid     Inflammatory arthritis 08/28/2016   Insomnia 08/28/2016   Migraine     Osteoarthritis of both feet 08/28/2016   Osteoarthritis of both hands 08/28/2016   Osteoarthritis of both knees 08/28/2016   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 Matthew Saras            Current Outpatient Medications  Medication Sig Dispense Refill   baclofen (LIORESAL) 10 MG tablet Take 1 tablet (10 mg total) by mouth 3 (three) times daily as needed for muscle pain 90 tablet 0   Cholecalciferol (VITAMIN D3) 250 MCG (10000 UT) capsule         estradiol (VIVELLE-DOT) 0.05 MG/24HR patch Place 1 patch onto the skin twice a week 8 patch 1   fluticasone (FLONASE) 50 MCG/ACT nasal spray Place 2 sprays into both nostrils daily. 16 g 6   Krill Oil 500 MG CAPS Take 1 capsule by mouth daily.       levothyroxine (SYNTHROID) 88 MCG tablet Take 1 tablet by mouth once daily 90 tablet 2   medroxyPROGESTERone (PROVERA) 10 MG tablet Take 1 tablet (10 mg total) by mouth daily. 30 tablet 1   nabumetone (RELAFEN) 500 MG tablet Take 1 tablet (500 mg total) by mouth daily. 90 tablet 0   pantoprazole (PROTONIX) 40 MG tablet Take 1 tablet (40 mg total) by mouth daily. 30 tablet 11   triamcinolone cream (KENALOG) 0.1 % Apply topically 2 times a day for up to 3 weeks in a row 80 g 0    No current facility-administered medications for this visit.      ALLERGIES: Compazine [prochlorperazine edisylate]        Family History  Problem Relation Age of Onset   Hypertension Mother     Dementia Mother     Hypertension Father     Stroke Father     Heart disease Father     Hypertension Brother     Cancer Maternal Aunt 42        breast   Aneurysm Maternal Aunt     Breast cancer Maternal Aunt     Hypertension Maternal Grandmother     Hypertension Maternal Grandfather     Kidney failure Maternal Grandfather     Aneurysm Maternal Grandfather     Cancer Cousin          breast   Breast cancer Cousin     Colon polyps Neg Hx     Esophageal cancer Neg Hx     Pancreatic cancer Neg Hx     Stomach cancer Neg Hx        Social History         Socioeconomic History   Marital status: Married      Spouse name: Not on file   Number of children: 2   Years of  education: Not on  file   Highest education level: Not on file  Occupational History   Occupation: RN      Employer: [employer]  Tobacco Use   Smoking status: Never   Smokeless tobacco: Never  Vaping Use   Vaping Use: Never used  Substance and Sexual Activity   Alcohol use: No   Drug use: No   Sexual activity: Yes      Partners: Male      Birth control/protection: None  Other Topics Concern   Not on file  Social History Narrative   Not on file    Social Determinants of Health    Financial Resource Strain: Not on file  Food Insecurity: Not on file  Transportation Needs: Not on file  Physical Activity: Not on file  Stress: Not on file  Social Connections: Not on file  Intimate Partner Violence: Not on file      Review of Systems   PHYSICAL EXAMINATION:     BP 124/80 (BP Location: Right Arm, Patient Position: Sitting, Cuff Size: Normal)   Pulse 78   LMP 02/22/2014     General appearance: alert, cooperative and appears stated age   Pelvic US Uterus 7.47 x 4.7 x 5.97 cm.   2 intramural fibroids: 2.31 and 2.31 cm.  EMS 3.04 mm.  Left ovary 2.40 x 1.32 x 0.86 cm.  Atrophic.  Right ovary 2.56 x 1.54 x 1.41 cm.  Atrophic.  No adnexal masses.  No free fluid.    Sonohysterogram Sterile prep of cervix with betadine.  Cannula placed inside uterine cavity and sterile NS injected.  Intracavitary mass 1.1 x 0.2 cm, anterior wall of right cornua.  Then intracavitary membrane notes, possible synechiae. No complications.  Minimal EBL.   Chaperone was present for exam:  Ivin Booty, [place] tech   ASSESSMENT   Postmenopausal bleeding on HRT.  Intramural fibroids.  Endometrial mass.   PLAN   Pelvic US and sonohysterogram images reviewed.    CBC.   Discussion of hysteroscopy with Myosure polypectomy, dilation and curettage.  Risks, benefits, and alternatives reviewed. Risks include but are not limited to bleeding, infection, damage to surrounding organs including uterine  perforation requiring hospitalization and laparoscopy, pulmonary edema, reaction to anesthesia, DVT, PE, death, need for further treatment and surgery including repeat hysteroscopy, hysterectomy or medical therapy.   Surgical expectations and recovery discussed.  Patient wishes to proceed.   An After Visit Summary was printed and given to the patient.   25 min  total time was spent for this patient encounter, including preparation, face-to-face counseling with the patient, coordination of care, and documentation of the encounter.

## 2022-09-23 NOTE — Transfer of Care (Signed)
Immediate Anesthesia Transfer of Care Note  Patient: Shelly [name]  Procedure(s) Performed: DILATATION & CURETTAGE/HYSTEROSCOPY WITH MYOSURE  Patient Location: PACU  Anesthesia Type:General  Level of Consciousness: awake, alert , oriented, and patient cooperative  Airway & Oxygen Therapy: Patient Spontanous Breathing  Post-op Assessment: Report given to RN and Post -op Vital signs reviewed and stable  Post vital signs: Reviewed and stable  Last Vitals:  Vitals Value Taken Time  BP 129/86 09/23/22 0805  Temp    Pulse 104 09/23/22 0807  Resp 21 09/23/22 0807  SpO2 96 % 09/23/22 0807  Vitals shown include unvalidated device data.  Last Pain:  Vitals:   09/23/22 0556  TempSrc: Oral  PainSc: 0-No pain      Patients Stated Pain Goal: 6 (83/01/59 9689)  Complications: No notable events documented.

## 2022-09-24 ENCOUNTER — Other Ambulatory Visit: Payer: Self-pay | Admitting: Obstetrics and Gynecology

## 2022-09-24 ENCOUNTER — Encounter (HOSPITAL_BASED_OUTPATIENT_CLINIC_OR_DEPARTMENT_OTHER): Payer: Self-pay | Admitting: Obstetrics and Gynecology

## 2022-09-24 DIAGNOSIS — Z79890 Hormone replacement therapy (postmenopausal): Secondary | ICD-10-CM

## 2022-09-25 ENCOUNTER — Other Ambulatory Visit: Payer: Self-pay | Admitting: Obstetrics and Gynecology

## 2022-09-25 ENCOUNTER — Other Ambulatory Visit: Payer: Self-pay

## 2022-09-25 DIAGNOSIS — Z79890 Hormone replacement therapy (postmenopausal): Secondary | ICD-10-CM

## 2022-09-25 NOTE — Telephone Encounter (Signed)
Med refill request:  Provera 10 mg Tab PO daily  AND  Vivelle Dot 0.05 mg patch twice weekly   Last AEX: OV with PAP 02/28/22 BS  Next AEX: Post Op 10/09/22 Last MMG (if hormonal med) 01/15/22 Bi Rads 1 NEG  Refill authorized: Please Advise?

## 2022-09-26 NOTE — Telephone Encounter (Signed)
See refill encounter dated 09/24/22, Rx refused by Dr. Quincy Simmonds "change not appropriate".  Patient is scheduled for Post-op visit 10/09/22.   Rx refused.   Routing to provider for final review.  Will close encounter.

## 2022-09-27 ENCOUNTER — Other Ambulatory Visit: Payer: Self-pay | Admitting: Obstetrics and Gynecology

## 2022-09-27 DIAGNOSIS — Z79890 Hormone replacement therapy (postmenopausal): Secondary | ICD-10-CM

## 2022-09-27 LAB — SURGICAL PATHOLOGY

## 2022-09-27 NOTE — Progress Notes (Unsigned)
[name] [place] Phone: [phone] Subjective:   Shelly [name], am serving as a scribe for Dr. Hulan Saas.  I'm seeing this patient by the request  of:  [name], Shelly A, NP  CC: back and neck pain follow up   [id number]  ELESE [name] is a 61 y.o. female coming in with complaint of back and neck pain. OMT 08/13/2022.  Continues to have some neck pain.  Has not been able to be as active as usual.  Patient describes the pain as a dull, throbbing aching sensation.  Medications patient has been prescribed: None  Taking:         Reviewed prior external information including notes and imaging from previsou exam, outside providers and external EMR if available.   As well as notes that were available from care everywhere and other healthcare systems.  Past medical history, social, surgical and family history all reviewed in electronic medical record.  No pertanent information unless stated regarding to the chief complaint.   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 POSITIVE muscle aches  Objective  Blood pressure 130/80, pulse 70, height '5\' 6"'$  (1.676 m), weight 163 lb (73.9 kg), last menstrual period 02/22/2014, SpO2 99 %.   General: No apparent distress alert and oriented x3 mood and affect normal, dressed appropriately.  HEENT: Pupils equal, extraocular movements intact  Respiratory: Patient's speak in full sentences and does not appear short of breath  Cardiovascular: No lower extremity edema, non tender, no erythema  MSK:  Back:does have low tightness noted.  Seems to be around the sacroiliac joint.  Seems to be right greater than left.  Also significant tightness of the neck noted.  Limited sidebending bilaterally.  Osteopathic findings  C3 flexed rotated and side bent right C7 flexed rotated and side bent left T3 extended rotated and side bent right inhaled rib T8 extended rotated and side bent left L2 flexed rotated and side bent right Sacrum right on right     Assessment and Plan:  Scapular dyskinesis Mild tightness noted.  Patient has had a lot of stress over the moment.  Is looking forward to finding more time for herself but is finding it difficult.  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  Patient will continue to increase activity slowly.  Continue to watch posture and ergonomics throughout the day.  Follow-up with me again  in 6 to 8 weeks.    Nonallopathic problems  Decision today to treat with OMT was based on Physical Exam  After verbal consent patient was treated with HVLA, ME, FPR techniques in cervical, rib, thoracic, lumbar, and sacral  areas  Patient tolerated the procedure well with improvement in symptoms  Patient given exercises, stretches and lifestyle modifications  See medications in patient instructions if given  Patient will follow up in 4-8  weeks    The above documentation has been reviewed and is accurate and complete [name], DO          Note: This dictation was prepared with Dragon dictation along with smaller phrase technology. Any transcriptional errors that result from this process are unintentional.

## 2022-09-27 NOTE — Telephone Encounter (Signed)
See refill encounter dated 09/24/22, Rx refused by Dr. Quincy Simmonds "change not appropriate".  Patient is scheduled for Post-op visit 10/09/22.    Rx refused.    Routing to provider for final review.  Will close encounter.

## 2022-09-29 ENCOUNTER — Other Ambulatory Visit: Payer: Self-pay | Admitting: Obstetrics and Gynecology

## 2022-09-29 DIAGNOSIS — Z79890 Hormone replacement therapy (postmenopausal): Secondary | ICD-10-CM

## 2022-09-30 ENCOUNTER — Other Ambulatory Visit (HOSPITAL_COMMUNITY): Payer: Self-pay

## 2022-09-30 ENCOUNTER — Other Ambulatory Visit: Payer: Self-pay | Admitting: Obstetrics and Gynecology

## 2022-09-30 DIAGNOSIS — Z79890 Hormone replacement therapy (postmenopausal): Secondary | ICD-10-CM

## 2022-09-30 NOTE — Progress Notes (Deleted)
GYNECOLOGY  VISIT   HPI: 61 y.o.   Married  Caucasian  female   [place] with Patient's last menstrual period was 02/22/2014.   here for   post op  GYNECOLOGIC HISTORY: Patient's last menstrual period was 02/22/2014. Contraception:  post menopausal Menopausal hormone therapy:  estradiol patch Last mammogram:  01/15/22, Breast Density Category C, BI-RADS CATEGORY 1 Negative Last pap smear:   02/28/22 negative: HR HPV negative, 02/14/21 negative: HR HPV negative        OB History     Gravida  0   Para  0   Term  0   Preterm  0   AB  0   Living  0      SAB  0   IAB  0   Ectopic  0   Multiple  0   Live Births           Obstetric Comments  Has 2 adopted children            Patient Active Problem List   Diagnosis Date Noted   Post-menopausal bleeding 01/02/2022   White coat syndrome with high blood pressure but without hypertension 01/02/2022   Left knee pain 06/01/2021   Aortic atherosclerosis ([place]) 01/25/2021   History of nephrolithiasis 01/19/2021   Anxiety 08/11/2019   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   Cubital tunnel syndrome 10/07/2018   Nonallopathic lesion of rib cage 07/08/2018   Nonallopathic lesion of sacral region 07/08/2018   Osteoarthritis of both hands 08/28/2016   Osteoarthritis of both feet 08/28/2016   Osteoarthritis of both knees 08/28/2016   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   Vitamin D deficiency 08/28/2016   Chronic neck pain 03/01/2016   Scapular dyskinesis 03/01/2016   Nonallopathic lesion of cervical region 03/01/2016   Nonallopathic lesion of thoracic region 03/01/2016   Nonallopathic lesion of lumbosacral region 03/01/2016   Family history of abdominal aortic aneurysm 08/12/2012   Renal calculus, left 06/04/2012   GERD (gastroesophageal reflux disease) 05/01/2012   History of migraine 05/01/2012   Hypothyroidism 05/01/2012   Leukopenia 05/01/2012   Allergic rhinitis due to allergen 05/01/2012    Past Medical History:   Diagnosis Date   Depression    Endometrial mass    Endometriosis    Fibroid, uterine    GERD (gastroesophageal reflux disease)    Heart murmur    History of adenomatous polyp of colon 2015   History of kidney stones    History of migraine    Hypothyroidism    followed by pcp   Insomnia 08/28/2016   Osteoarthritis 08/28/2016   feet, hands, knees   PMB (postmenopausal bleeding)    Sicca syndrome with keratoconjunctivitis ([place]) 08/28/2016   Somatic dysfunction    cervical , lumbar, sacral , rib cage region   Vitamin D deficiency 08/28/2016    Past Surgical History:  Procedure Laterality Date   COLONOSCOPY WITH ESOPHAGOGASTRODUODENOSCOPY (EGD)  04/11/2021   dr stark   DIAGNOSTIC LAPAROSCOPY     1993  and 1994  w/ laser ablation endometriosis   DILATATION & CURETTAGE/HYSTEROSCOPY WITH MYOSURE N/A 09/23/2022   Procedure: DILATATION & CURETTAGE/HYSTEROSCOPY WITH MYOSURE;  Surgeon: Nunzio Cobbs, MD;  Location: [facility];  Service: Gynecology;  Laterality: N/A;    Current Outpatient Medications  Medication Sig Dispense Refill   baclofen (LIORESAL) 10 MG tablet Take 1 tablet (10 mg total) by mouth 3 (three) times daily as needed for muscle pain 90 tablet 0   Cholecalciferol (VITAMIN D3)  250 MCG (10000 UT) capsule Take 10,000 Units by mouth daily.     estradiol (VIVELLE-DOT) 0.05 MG/24HR patch Place 1 patch onto the skin twice a week (Patient taking differently: Place 1 patch onto the skin 2 (two) times a week.) 8 patch 1   fluticasone (FLONASE) 50 MCG/ACT nasal spray Place 2 sprays into both nostrils daily. 16 g 6   ibuprofen (ADVIL) 800 MG tablet Take 1 tablet (800 mg total) by mouth every 8 (eight) hours as needed. 30 tablet 0   Krill Oil 500 MG CAPS Take 1 capsule by mouth daily.     levothyroxine (SYNTHROID) 88 MCG tablet Take 1 tablet by mouth once daily (Patient taking differently: Take 88 mcg by mouth at bedtime.) 90 tablet 2   medroxyPROGESTERone  (PROVERA) 10 MG tablet Take 1 tablet (10 mg total) by mouth daily. (Patient taking differently: Take 10 mg by mouth at bedtime.) 30 tablet 1   nabumetone (RELAFEN) 500 MG tablet Take 1 tablet (500 mg total) by mouth daily. (Patient taking differently: Take 500 mg by mouth daily as needed.) 90 tablet 0   pantoprazole (PROTONIX) 40 MG tablet Take 1 tablet (40 mg total) by mouth daily. (Patient taking differently: Take 40 mg by mouth daily.) 30 tablet 11   triamcinolone cream (KENALOG) 0.1 % Apply topically 2 times a day for up to 3 weeks in a row 80 g 0   No current facility-administered medications for this visit.     ALLERGIES: Compazine [prochlorperazine edisylate]  Family History  Problem Relation Age of Onset   Hypertension Mother    Dementia Mother    Hypertension Father    Stroke Father    Heart disease Father    Hypertension Brother    Cancer Maternal Aunt 42       breast   Aneurysm Maternal Aunt    Breast cancer Maternal Aunt    Hypertension Maternal Grandmother    Hypertension Maternal Grandfather    Kidney failure Maternal Grandfather    Aneurysm Maternal Grandfather    Cancer Cousin        breast   Breast cancer Cousin    Colon polyps Neg Hx    Esophageal cancer Neg Hx    Pancreatic cancer Neg Hx    Stomach cancer Neg Hx     Social History   Socioeconomic History   Marital status: Married    Spouse name: Not on file   Number of children: 2   Years of education: Not on file   Highest education level: Not on file  Occupational History   Occupation: Programmer, multimedia: [employer]  Tobacco Use   Smoking status: Never   Smokeless tobacco: Never  Vaping Use   Vaping Use: Never used  Substance and Sexual Activity   Alcohol use: Not Currently    Comment: rarely   Drug use: Never   Sexual activity: Yes    Partners: Male    Birth control/protection: Post-menopausal  Other Topics Concern   Not on file  Social History Narrative   Not on file   Social  Determinants of Health   Financial Resource Strain: Not on file  Food Insecurity: Not on file  Transportation Needs: Not on file  Physical Activity: Not on file  Stress: Not on file  Social Connections: Not on file  Intimate Partner Violence: Not on file    Review of Systems  PHYSICAL EXAMINATION:    LMP 02/22/2014     General  appearance: alert, cooperative and appears stated age Head: Normocephalic, without obvious abnormality, atraumatic Neck: no adenopathy, supple, symmetrical, trachea midline and thyroid normal to inspection and palpation Lungs: clear to auscultation bilaterally Breasts: normal appearance, no masses or tenderness, No nipple retraction or dimpling, No nipple discharge or bleeding, No axillary or supraclavicular adenopathy Heart: regular rate and rhythm Abdomen: soft, non-tender, no masses,  no organomegaly Extremities: extremities normal, atraumatic, no cyanosis or edema Skin: Skin color, texture, turgor normal. No rashes or lesions Lymph nodes: Cervical, supraclavicular, and axillary nodes normal. No abnormal inguinal nodes palpated Neurologic: Grossly normal  Pelvic: External genitalia:  no lesions              Urethra:  normal appearing urethra with no masses, tenderness or lesions              Bartholins and Skenes: normal                 Vagina: normal appearing vagina with normal color and discharge, no lesions              Cervix: no lesions                Bimanual Exam:  Uterus:  normal size, contour, position, consistency, mobility, non-tender              Adnexa: no mass, fullness, tenderness              Rectal exam: {yes no:314532}.  Confirms.              Anus:  normal sphincter tone, no lesions  Chaperone was present for exam:  ***  ASSESSMENT     PLAN     An After Visit Summary was printed and given to the patient.  ______ minutes face to face time of which over 50% was spent in counseling.

## 2022-10-01 ENCOUNTER — Ambulatory Visit: Payer: 59 | Admitting: Family Medicine

## 2022-10-01 ENCOUNTER — Other Ambulatory Visit (HOSPITAL_COMMUNITY): Payer: Self-pay

## 2022-10-01 ENCOUNTER — Other Ambulatory Visit: Payer: Self-pay | Admitting: *Deleted

## 2022-10-01 VITALS — BP 130/80 | HR 70 | Ht 66.0 in | Wt 163.0 lb

## 2022-10-01 DIAGNOSIS — Z79890 Hormone replacement therapy (postmenopausal): Secondary | ICD-10-CM

## 2022-10-01 DIAGNOSIS — M9902 Segmental and somatic dysfunction of thoracic region: Secondary | ICD-10-CM

## 2022-10-01 DIAGNOSIS — M9904 Segmental and somatic dysfunction of sacral region: Secondary | ICD-10-CM

## 2022-10-01 DIAGNOSIS — M9901 Segmental and somatic dysfunction of cervical region: Secondary | ICD-10-CM | POA: Diagnosis not present

## 2022-10-01 DIAGNOSIS — G2589 Other specified extrapyramidal and movement disorders: Secondary | ICD-10-CM | POA: Diagnosis not present

## 2022-10-01 DIAGNOSIS — M9903 Segmental and somatic dysfunction of lumbar region: Secondary | ICD-10-CM | POA: Diagnosis not present

## 2022-10-01 DIAGNOSIS — M9908 Segmental and somatic dysfunction of rib cage: Secondary | ICD-10-CM | POA: Diagnosis not present

## 2022-10-01 MED ORDER — MEDROXYPROGESTERONE ACETATE 10 MG PO TABS
10.0000 mg | ORAL_TABLET | Freq: Every day | ORAL | 0 refills | Status: DC
  Filled 2022-10-01: qty 90, 90d supply, fill #0

## 2022-10-01 MED ORDER — ESTRADIOL 0.05 MG/24HR TD PTTW
MEDICATED_PATCH | TRANSDERMAL | 0 refills | Status: DC
  Filled 2022-10-01: qty 24, 84d supply, fill #0

## 2022-10-01 NOTE — Patient Instructions (Signed)
Good to see you Follow up in 6-8 weeks

## 2022-10-01 NOTE — Assessment & Plan Note (Addendum)
Mild tightness noted.  Patient has had a lot of stress over the moment.  Is looking forward to finding more time for herself but is finding it difficult.  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  Patient will continue to increase activity slowly.  Continue to watch posture and ergonomics throughout the day.  Follow-up with me again in 6 to 8 weeks.

## 2022-10-01 NOTE — Telephone Encounter (Signed)
Patient left voicemail requesting refill of estradiol patch 0.05 mg and provera. Refill previously denied, "refill not appropriate". S/P Hysteroscopy D&C 09/23/22. Will run out of patches prior to appt on 10/09/22. Patient states she was not aware she would not continue medication, requesting f/u. If ok to proceed with RX, requesting 90 day supply.   Dr. Quincy Simmonds -please review.

## 2022-10-02 ENCOUNTER — Other Ambulatory Visit (HOSPITAL_COMMUNITY): Payer: Self-pay

## 2022-10-09 ENCOUNTER — Encounter: Payer: 59 | Admitting: Obstetrics and Gynecology

## 2022-10-11 NOTE — Progress Notes (Unsigned)
GYNECOLOGY  VISIT   HPI: 61 y.o.   Married  Caucasian  female   [place] with Patient's last menstrual period was 02/22/2014.   here for   post op  GYNECOLOGIC HISTORY: Patient's last menstrual period was 02/22/2014. Contraception:  post menopausal Menopausal hormone therapy:  estradiol patch Last mammogram:  01/15/22, Breast Density Category C, BI-RADS CATEGORY 1 Negative Last pap smear:   02/28/22 negative: HR HPV negative        OB History     Gravida  0   Para  0   Term  0   Preterm  0   AB  0   Living  0      SAB  0   IAB  0   Ectopic  0   Multiple  0   Live Births           Obstetric Comments  Has 2 adopted children            Patient Active Problem List   Diagnosis Date Noted   Post-menopausal bleeding 01/02/2022   White coat syndrome with high blood pressure but without hypertension 01/02/2022   Left knee pain 06/01/2021   Aortic atherosclerosis ([place]) 01/25/2021   History of nephrolithiasis 01/19/2021   Anxiety 08/11/2019   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   Cubital tunnel syndrome 10/07/2018   Nonallopathic lesion of rib cage 07/08/2018   Nonallopathic lesion of sacral region 07/08/2018   Osteoarthritis of both hands 08/28/2016   Osteoarthritis of both feet 08/28/2016   Osteoarthritis of both knees 08/28/2016   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   Vitamin D deficiency 08/28/2016   Chronic neck pain 03/01/2016   Scapular dyskinesis 03/01/2016   Nonallopathic lesion of cervical region 03/01/2016   Nonallopathic lesion of thoracic region 03/01/2016   Nonallopathic lesion of lumbosacral region 03/01/2016   Family history of abdominal aortic aneurysm 08/12/2012   Renal calculus, left 06/04/2012   GERD (gastroesophageal reflux disease) 05/01/2012   History of migraine 05/01/2012   Hypothyroidism 05/01/2012   Leukopenia 05/01/2012   Allergic rhinitis due to allergen 05/01/2012    Past Medical History:  Diagnosis Date   Depression     Endometrial mass    Endometriosis    Fibroid, uterine    GERD (gastroesophageal reflux disease)    Heart murmur    History of adenomatous polyp of colon 2015   History of kidney stones    History of migraine    Hypothyroidism    followed by pcp   Insomnia 08/28/2016   Osteoarthritis 08/28/2016   feet, hands, knees   PMB (postmenopausal bleeding)    Sicca syndrome with keratoconjunctivitis ([place]) 08/28/2016   Somatic dysfunction    cervical , lumbar, sacral , rib cage region   Vitamin D deficiency 08/28/2016    Past Surgical History:  Procedure Laterality Date   COLONOSCOPY WITH ESOPHAGOGASTRODUODENOSCOPY (EGD)  04/11/2021   dr stark   DIAGNOSTIC LAPAROSCOPY     1993  and 1994  w/ laser ablation endometriosis   DILATATION & CURETTAGE/HYSTEROSCOPY WITH MYOSURE N/A 09/23/2022   Procedure: DILATATION & CURETTAGE/HYSTEROSCOPY WITH MYOSURE;  Surgeon: Nunzio Cobbs, MD;  Location: [facility];  Service: Gynecology;  Laterality: N/A;    Current Outpatient Medications  Medication Sig Dispense Refill   baclofen (LIORESAL) 10 MG tablet Take 1 tablet (10 mg total) by mouth 3 (three) times daily as needed for muscle pain 90 tablet 0   Cholecalciferol (VITAMIN D3) 250 MCG (10000 UT) capsule  Take 10,000 Units by mouth daily.     estradiol (VIVELLE-DOT) 0.05 MG/24HR patch Place 1 patch onto the skin twice a week 24 patch 0   fluticasone (FLONASE) 50 MCG/ACT nasal spray Place 2 sprays into both nostrils daily. 16 g 6   ibuprofen (ADVIL) 800 MG tablet Take 1 tablet (800 mg total) by mouth every 8 (eight) hours as needed. 30 tablet 0   Krill Oil 500 MG CAPS Take 1 capsule by mouth daily.     levothyroxine (SYNTHROID) 88 MCG tablet Take 1 tablet by mouth once daily (Patient taking differently: Take 88 mcg by mouth at bedtime.) 90 tablet 2   medroxyPROGESTERone (PROVERA) 10 MG tablet Take 1 tablet (10 mg total) by mouth daily. 90 tablet 0   nabumetone (RELAFEN) 500 MG  tablet Take 1 tablet (500 mg total) by mouth daily. (Patient taking differently: Take 500 mg by mouth daily as needed.) 90 tablet 0   pantoprazole (PROTONIX) 40 MG tablet Take 1 tablet (40 mg total) by mouth daily. (Patient taking differently: Take 40 mg by mouth daily.) 30 tablet 11   triamcinolone cream (KENALOG) 0.1 % Apply topically 2 times a day for up to 3 weeks in a row 80 g 0   No current facility-administered medications for this visit.     ALLERGIES: Compazine [prochlorperazine edisylate]  Family History  Problem Relation Age of Onset   Hypertension Mother    Dementia Mother    Hypertension Father    Stroke Father    Heart disease Father    Hypertension Brother    Cancer Maternal Aunt 42       breast   Aneurysm Maternal Aunt    Breast cancer Maternal Aunt    Hypertension Maternal Grandmother    Hypertension Maternal Grandfather    Kidney failure Maternal Grandfather    Aneurysm Maternal Grandfather    Cancer Cousin        breast   Breast cancer Cousin    Colon polyps Neg Hx    Esophageal cancer Neg Hx    Pancreatic cancer Neg Hx    Stomach cancer Neg Hx     Social History   Socioeconomic History   Marital status: Married    Spouse name: Not on file   Number of children: 2   Years of education: Not on file   Highest education level: Not on file  Occupational History   Occupation: Programmer, multimedia: [employer]  Tobacco Use   Smoking status: Never   Smokeless tobacco: Never  Vaping Use   Vaping Use: Never used  Substance and Sexual Activity   Alcohol use: Not Currently    Comment: rarely   Drug use: Never   Sexual activity: Yes    Partners: Male    Birth control/protection: Post-menopausal  Other Topics Concern   Not on file  Social History Narrative   Not on file   Social Determinants of Health   Financial Resource Strain: Not on file  Food Insecurity: Not on file  Transportation Needs: Not on file  Physical Activity: Not on file  Stress:  Not on file  Social Connections: Not on file  Intimate Partner Violence: Not on file    Review of Systems  PHYSICAL EXAMINATION:    LMP 02/22/2014     General appearance: alert, cooperative and appears stated age Head: Normocephalic, without obvious abnormality, atraumatic Neck: no adenopathy, supple, symmetrical, trachea midline and thyroid normal to inspection and palpation Lungs: clear  to auscultation bilaterally Breasts: normal appearance, no masses or tenderness, No nipple retraction or dimpling, No nipple discharge or bleeding, No axillary or supraclavicular adenopathy Heart: regular rate and rhythm Abdomen: soft, non-tender, no masses,  no organomegaly Extremities: extremities normal, atraumatic, no cyanosis or edema Skin: Skin color, texture, turgor normal. No rashes or lesions Lymph nodes: Cervical, supraclavicular, and axillary nodes normal. No abnormal inguinal nodes palpated Neurologic: Grossly normal  Pelvic: External genitalia:  no lesions              Urethra:  normal appearing urethra with no masses, tenderness or lesions              Bartholins and Skenes: normal                 Vagina: normal appearing vagina with normal color and discharge, no lesions              Cervix: no lesions                Bimanual Exam:  Uterus:  normal size, contour, position, consistency, mobility, non-tender              Adnexa: no mass, fullness, tenderness              Rectal exam: {yes no:314532}.  Confirms.              Anus:  normal sphincter tone, no lesions  Chaperone was present for exam:  ***  ASSESSMENT     PLAN     An After Visit Summary was printed and given to the patient.  ______ minutes face to face time of which over 50% was spent in counseling.

## 2022-10-15 ENCOUNTER — Other Ambulatory Visit (HOSPITAL_COMMUNITY): Payer: Self-pay

## 2022-10-15 ENCOUNTER — Encounter: Payer: Self-pay | Admitting: Obstetrics and Gynecology

## 2022-10-15 ENCOUNTER — Ambulatory Visit (INDEPENDENT_AMBULATORY_CARE_PROVIDER_SITE_OTHER): Payer: 59 | Admitting: Obstetrics and Gynecology

## 2022-10-15 VITALS — BP 122/84 | HR 76 | Ht 65.5 in | Wt 166.0 lb

## 2022-10-15 DIAGNOSIS — Z9889 Other specified postprocedural states: Secondary | ICD-10-CM

## 2022-10-15 DIAGNOSIS — Z79890 Hormone replacement therapy (postmenopausal): Secondary | ICD-10-CM | POA: Diagnosis not present

## 2022-10-15 MED ORDER — PROGESTERONE MICRONIZED 100 MG PO CAPS
100.0000 mg | ORAL_CAPSULE | Freq: Every day | ORAL | 0 refills | Status: DC
  Filled 2022-10-15: qty 90, 90d supply, fill #0

## 2022-10-19 ENCOUNTER — Other Ambulatory Visit: Payer: Self-pay | Admitting: Gastroenterology

## 2022-10-22 ENCOUNTER — Other Ambulatory Visit (HOSPITAL_COMMUNITY): Payer: Self-pay

## 2022-10-22 MED ORDER — PANTOPRAZOLE SODIUM 40 MG PO TBEC
40.0000 mg | DELAYED_RELEASE_TABLET | Freq: Every day | ORAL | 0 refills | Status: DC
  Filled 2022-10-22: qty 30, 30d supply, fill #0

## 2022-12-02 ENCOUNTER — Other Ambulatory Visit: Payer: Self-pay | Admitting: Gastroenterology

## 2022-12-03 ENCOUNTER — Other Ambulatory Visit: Payer: Self-pay

## 2022-12-17 ENCOUNTER — Other Ambulatory Visit (HOSPITAL_COMMUNITY): Payer: Self-pay

## 2022-12-20 ENCOUNTER — Other Ambulatory Visit (HOSPITAL_COMMUNITY): Payer: Self-pay

## 2022-12-20 ENCOUNTER — Other Ambulatory Visit: Payer: Self-pay | Admitting: Obstetrics and Gynecology

## 2022-12-20 ENCOUNTER — Other Ambulatory Visit: Payer: Self-pay | Admitting: Gastroenterology

## 2022-12-20 DIAGNOSIS — Z79890 Hormone replacement therapy (postmenopausal): Secondary | ICD-10-CM

## 2022-12-20 NOTE — Telephone Encounter (Signed)
Medication refill request: Estradiol dott  Last AEX:  10/15/22 last OV for HRT  Next AEX: 01/14/23 Last MMG (if hormonal medication request): 01/15/22 Bi-rads 1 neg Refill authorized: 8 patches with 0 refill  Patient confirmed that she will run out of patches before her AEX #8 pended for today.

## 2022-12-24 ENCOUNTER — Other Ambulatory Visit (HOSPITAL_COMMUNITY): Payer: Self-pay

## 2022-12-24 ENCOUNTER — Other Ambulatory Visit (HOSPITAL_BASED_OUTPATIENT_CLINIC_OR_DEPARTMENT_OTHER): Payer: Self-pay

## 2022-12-24 MED ORDER — ESTRADIOL 0.05 MG/24HR TD PTTW
MEDICATED_PATCH | TRANSDERMAL | 0 refills | Status: DC
  Filled 2022-12-24: qty 8, 28d supply, fill #0

## 2022-12-31 NOTE — Progress Notes (Unsigned)
[facility] [place] Phone: [phone] Subjective:   Shelly Shelly [name], am serving as a scribe for Dr. Hulan Saas.  I'm seeing this patient by the request  of:  Shelly [name], Shelly A, NP  CC: Foot pain and neck pain  [id number]  Shelly Shelly [name] is a 62 y.o. female coming in with complaint of back and neck pain. OMT on 10/01/2022. Patient states that she is having tightness in her neck and R side of lumbar spine due to not getting OMT for a while.   Patient also notes pain in back and bottom of both heels, L>R. Worse in the mornings. Pain occurring for past month.    B elbow pain R>L. Pain has improved but was ready for an injection a month ago.   Medications patient has been prescribed:   Taking:         Reviewed prior external information including notes and imaging from previsou exam, outside providers and external EMR if available.   As well as notes that were available from care everywhere and other healthcare systems.  Past medical history, social, surgical and family history all reviewed in electronic medical record.  Shelly [name] pertanent information unless stated regarding to the chief complaint.   Past Medical History:  Diagnosis Date   Depression    Endometrial mass    Endometriosis    Fibroid, uterine    GERD (gastroesophageal reflux disease)    Heart murmur    History of adenomatous polyp of colon 2015   History of kidney stones    History of migraine    Hypothyroidism    followed by pcp   Insomnia 08/28/2016   Osteoarthritis 08/28/2016   feet, hands, knees   PMB (postmenopausal bleeding)    Sicca syndrome with keratoconjunctivitis ([place]) 08/28/2016   Somatic dysfunction    cervical , lumbar, sacral , rib cage region   Vitamin D deficiency 08/28/2016    Allergies  Allergen Reactions   Compazine [Prochlorperazine Edisylate] Other (See Comments)    Muscle contractions     Review of Systems:   Shelly [name] headache, visual changes, nausea, vomiting, diarrhea, constipation, dizziness, abdominal pain, skin rash, fevers, chills, night sweats, weight loss, swollen lymph nodes, body aches, joint swelling, chest pain, shortness of breath, mood changes. POSITIVE muscle aches  Objective  Blood pressure 108/80, pulse 71, height 5' 5.5" (1.664 m), weight 177 lb (80.3 kg), last menstrual period 02/22/2014, SpO2 99 %.   General: Shelly [name] apparent distress alert and oriented x3 mood and affect normal, dressed appropriately.  HEENT: Pupils equal, extraocular movements intact  Respiratory: Patient's speak in full sentences and does not appear short of breath  Cardiovascular: Shelly [name] lower extremity edema, non tender, Shelly [name] erythema  Tender to palpation laterally over the medial calcaneal area.  Patient does have overpronation of the hindfoot noted.  Patient does have tightness of the Achilles noted.  Patient does have a mild Haglund nodule even noted on the left side.  Osteopathic findings  C2 flexed rotated and side bent right C3 flexed rotated and side bent left C6 flexed rotated and side bent left C7 flexed rotated and side bent right T3 extended rotated and side bent right inhaled rib T9 extended rotated and side bent left L2 flexed rotated and side bent right Sacrum right on right       Assessment and Plan:  Scapular dyskinesis Scapular dyskinesis  Achilles tendinitis of both lower extremities Achilles tendinosis tendinitis noted today,  discussed heel lift, proper shoes, recovery sandals that would all be beneficial.  Discussed icing regimen and home exercises, which activities to avoid, follow-up again in 6 to 8 weeks worsening pain may need to consider formal physical therapy or bracing.  Follow-up with me again in 6 to 8 weeks  Chronic neck pain Has had some more stress recently, not being able to do daily activities as regularly, discussed icing regimen and home exercises, increase activity slowly.   Follow-up again in 6 to 8 weeks.    Nonallopathic problems  Decision today to treat with OMT was based on Physical Exam  After verbal consent patient was treated with HVLA, ME, FPR techniques in cervical, rib, thoracic, lumbar, and sacral  areas  Patient tolerated the procedure well with improvement in symptoms  Patient given exercises, stretches and lifestyle modifications  See medications in patient instructions if given  Patient will follow up in 4-8 weeks    The above documentation has been reviewed and is accurate and complete [name], DO          Note: This dictation was prepared with Dragon dictation along with smaller phrase technology. Any transcriptional errors that result from this process are unintentional.

## 2023-01-01 ENCOUNTER — Ambulatory Visit (INDEPENDENT_AMBULATORY_CARE_PROVIDER_SITE_OTHER): Payer: 59 | Admitting: Family Medicine

## 2023-01-01 VITALS — BP 108/80 | HR 71 | Ht 65.5 in | Wt 177.0 lb

## 2023-01-01 DIAGNOSIS — M7661 Achilles tendinitis, right leg: Secondary | ICD-10-CM

## 2023-01-01 DIAGNOSIS — M9901 Segmental and somatic dysfunction of cervical region: Secondary | ICD-10-CM | POA: Diagnosis not present

## 2023-01-01 DIAGNOSIS — G2589 Other specified extrapyramidal and movement disorders: Secondary | ICD-10-CM | POA: Diagnosis not present

## 2023-01-01 DIAGNOSIS — M7662 Achilles tendinitis, left leg: Secondary | ICD-10-CM | POA: Diagnosis not present

## 2023-01-01 DIAGNOSIS — M9902 Segmental and somatic dysfunction of thoracic region: Secondary | ICD-10-CM

## 2023-01-01 DIAGNOSIS — M9903 Segmental and somatic dysfunction of lumbar region: Secondary | ICD-10-CM

## 2023-01-01 DIAGNOSIS — M9904 Segmental and somatic dysfunction of sacral region: Secondary | ICD-10-CM | POA: Diagnosis not present

## 2023-01-01 DIAGNOSIS — M542 Cervicalgia: Secondary | ICD-10-CM

## 2023-01-01 DIAGNOSIS — G8929 Other chronic pain: Secondary | ICD-10-CM | POA: Diagnosis not present

## 2023-01-01 DIAGNOSIS — M9908 Segmental and somatic dysfunction of rib cage: Secondary | ICD-10-CM | POA: Diagnosis not present

## 2023-01-01 NOTE — Patient Instructions (Signed)
Good to see you Spenco orthotic Continue Oofos in the house Heel lift could help as well if needed  Exercises given Ice after a long day See me again in 6-8 weeks

## 2023-01-01 NOTE — Assessment & Plan Note (Signed)
Scapular dyskinesis

## 2023-01-02 DIAGNOSIS — M7661 Achilles tendinitis, right leg: Secondary | ICD-10-CM | POA: Insufficient documentation

## 2023-01-02 NOTE — Assessment & Plan Note (Signed)
Achilles tendinosis tendinitis noted today, discussed heel lift, proper shoes, recovery sandals that would all be beneficial.  Discussed icing regimen and home exercises, which activities to avoid, follow-up again in 6 to 8 weeks worsening pain may need to consider formal physical therapy or bracing.  Follow-up with me again in 6 to 8 weeks

## 2023-01-02 NOTE — Assessment & Plan Note (Signed)
Has had some more stress recently, not being able to do daily activities as regularly, discussed icing regimen and home exercises, increase activity slowly.  Follow-up again in 6 to 8 weeks.

## 2023-01-14 ENCOUNTER — Ambulatory Visit: Payer: Commercial Managed Care - PPO | Admitting: Obstetrics and Gynecology

## 2023-01-18 ENCOUNTER — Other Ambulatory Visit: Payer: Self-pay | Admitting: Gastroenterology

## 2023-01-18 ENCOUNTER — Other Ambulatory Visit: Payer: Self-pay | Admitting: Obstetrics and Gynecology

## 2023-01-18 DIAGNOSIS — Z79890 Hormone replacement therapy (postmenopausal): Secondary | ICD-10-CM

## 2023-01-20 ENCOUNTER — Other Ambulatory Visit: Payer: Self-pay | Admitting: Obstetrics and Gynecology

## 2023-01-20 DIAGNOSIS — Z79890 Hormone replacement therapy (postmenopausal): Secondary | ICD-10-CM

## 2023-01-20 MED ORDER — ESTRADIOL 0.05 MG/24HR TD PTTW
MEDICATED_PATCH | TRANSDERMAL | 0 refills | Status: DC
  Filled 2023-01-20: qty 8, 28d supply, fill #0

## 2023-01-20 MED ORDER — PROGESTERONE MICRONIZED 100 MG PO CAPS
100.0000 mg | ORAL_CAPSULE | Freq: Every day | ORAL | 0 refills | Status: DC
  Filled 2023-01-20: qty 30, 30d supply, fill #0
  Filled 2023-02-20: qty 30, 30d supply, fill #1
  Filled 2023-03-20: qty 30, 30d supply, fill #2

## 2023-01-21 ENCOUNTER — Other Ambulatory Visit (HOSPITAL_COMMUNITY): Payer: Self-pay

## 2023-01-27 ENCOUNTER — Other Ambulatory Visit (HOSPITAL_COMMUNITY): Payer: Self-pay

## 2023-01-28 ENCOUNTER — Other Ambulatory Visit (HOSPITAL_COMMUNITY): Payer: Self-pay

## 2023-02-18 NOTE — Progress Notes (Unsigned)
62 y.o. G79P0000 Married Caucasian female here for annual exam.    She is bleeding vaginally every other week.  Bleeding lasts for 3 days.   She is using transdermal estrogen 0.05 mg twice weekly and Prometrium 100 mg nightly.  No missed dosages.   She had breast tenderness on Prometrium 200 mg.   Patient has taken Provera for her progesterone and continued to have postmenopausal bleeding.   This pattern has been occurring for about 2 years.   Hx endometrial polyp removed with hysteroscopy 09/23/22.    She is having hot flashes at night currently.   She originally started on HRT for vaginal dryness.   She wants me to take over her thyroid medication and check her vitamin D level.   PCP:   Rodman Pickle, NP  Patient's last menstrual period was 02/22/2014.    Pt is still having PMP every two weeks       Sexually active: Yes.    The current method of family planning is post menopausal status.    Exercising: No.     Smoker:  no  Health Maintenance: Pap:  02/28/22 neg: HR HPV neg History of abnormal Pap:  no MMG:  01/15/22 Breast Density Cat C, BI-RADS CAT 1 neg Colonoscopy:  04/11/21 BMD:   09/27/20  Result  osteopenia TDaP:  unsure, thinks only 5-7 years ago Gardasil:   no Screening Labs:  PCP   reports that she has never smoked. She has never used smokeless tobacco. She reports that she does not currently use alcohol. She reports that she does not use drugs.  Past Medical History:  Diagnosis Date   Depression    Endometrial mass    Endometriosis    Fibroid, uterine    GERD (gastroesophageal reflux disease)    Heart murmur    History of adenomatous polyp of colon 2015   History of kidney stones    History of migraine    Hypothyroidism    followed by pcp   Insomnia 08/28/2016   Osteoarthritis 08/28/2016   feet, hands, knees   PMB (postmenopausal bleeding)    Sicca syndrome with keratoconjunctivitis (HCC) 08/28/2016   Somatic dysfunction    cervical , lumbar, sacral  , rib cage region   Vitamin D deficiency 08/28/2016    Past Surgical History:  Procedure Laterality Date   COLONOSCOPY WITH ESOPHAGOGASTRODUODENOSCOPY (EGD)  04/11/2021   dr stark   DIAGNOSTIC LAPAROSCOPY     1993  and 1994  w/ laser ablation endometriosis   DILATATION & CURETTAGE/HYSTEROSCOPY WITH MYOSURE N/A 09/23/2022   Procedure: DILATATION & CURETTAGE/HYSTEROSCOPY WITH MYOSURE;  Surgeon: Patton Salles, MD;  Location: [facility];  Service: Gynecology;  Laterality: N/A;    Current Outpatient Medications  Medication Sig Dispense Refill   baclofen (LIORESAL) 10 MG tablet Take 1 tablet (10 mg total) by mouth 3 (three) times daily as needed for muscle pain 90 tablet 0   Cholecalciferol (VITAMIN D3) 250 MCG (10000 UT) capsule Take 10,000 Units by mouth daily.     estradiol (DOTTI) 0.05 MG/24HR patch Place 1 patch onto the skin twice a week 24 patch 0   fluticasone (FLONASE) 50 MCG/ACT nasal spray Place 2 sprays into both nostrils daily. 16 g 6   Krill Oil 500 MG CAPS Take 1 capsule by mouth daily.     levothyroxine (SYNTHROID) 88 MCG tablet Take 1 tablet by mouth once daily (Patient taking differently: Take 88 mcg by mouth at bedtime.) 90  tablet 2   nabumetone (RELAFEN) 500 MG tablet Take 1 tablet (500 mg total) by mouth daily. (Patient taking differently: Take 500 mg by mouth daily as needed.) 90 tablet 0   pantoprazole (PROTONIX) 40 MG tablet Take 1 tablet (40 mg total) by mouth daily. 30 tablet 0   progesterone (PROMETRIUM) 100 MG capsule Take 1 capsule (100 mg total) by mouth daily at bedtime. 90 capsule 0   triamcinolone cream (KENALOG) 0.1 % Apply topically 2 times a day for up to 3 weeks in a row 80 g 0   No current facility-administered medications for this visit.    Family History  Problem Relation Age of Onset   Hypertension Mother    Dementia Mother    Hypertension Father    Stroke Father    Heart disease Father    Hypertension Brother     Cancer Maternal Aunt 18       breast   Aneurysm Maternal Aunt    Breast cancer Maternal Aunt    Hypertension Maternal Grandmother    Hypertension Maternal Grandfather    Kidney failure Maternal Grandfather    Aneurysm Maternal Grandfather    Cancer Cousin        breast   Breast cancer Cousin    Colon polyps Neg Hx    Esophageal cancer Neg Hx    Pancreatic cancer Neg Hx    Stomach cancer Neg Hx     Review of Systems  All other systems reviewed and are negative.   Exam:   BP 126/74 (BP Location: Left Arm, Patient Position: Sitting, Cuff Size: Normal)   Pulse 72   Ht 5' 6.5" (1.689 m)   Wt 171 lb (77.6 kg)   LMP 02/22/2014   SpO2 99%   BMI 27.19 kg/m     General appearance: alert, cooperative and appears stated age Head: normocephalic, without obvious abnormality, atraumatic Neck: no adenopathy, supple, symmetrical, trachea midline and thyroid normal to inspection and palpation Lungs: clear to auscultation bilaterally Breasts: normal appearance, no masses or tenderness, No nipple retraction or dimpling, No nipple discharge or bleeding, No axillary adenopathy Heart: regular rate and rhythm Abdomen: soft, non-tender; no masses, no organomegaly Extremities: extremities normal, atraumatic, no cyanosis or edema Skin: skin color, texture, turgor normal. No rashes or lesions Lymph nodes: cervical, supraclavicular, and axillary nodes normal. Neurologic: grossly normal  Pelvic: External genitalia:  no lesions              No abnormal inguinal nodes palpated.              Urethra:  normal appearing urethra with no masses, tenderness or lesions              Bartholins and Skenes: normal                 Vagina: normal appearing vagina with normal color and discharge, no lesions              Cervix: no lesions              Pap taken: Yes.   Bimanual Exam:  Uterus:  normal size, contour, position, consistency, mobility, non-tender              Adnexa: no mass, fullness, tenderness               Rectal exam: Yes.  .  Confirms.              Anus:  normal sphincter  tone, no lesions  Chaperone was present for exam:  Warren Lacy, CMA  Assessment:   Well woman visit with gynecologic exam. Recurrent postmenopausal bleeding.  Menopausal symptoms.  HRT.  Fibroids. Status post hysteroscopic resection of endometrial polyps, dilation and curettage.  Cervical cancer screening. Hypothyroidism.  Osteopenia.  Renal stones.   Plan: Mammogram screening discussed.  She will schedule. Self breast awareness reviewed. Pap and HR HPV collected. Will check GC/CT/trich from pap.  Trial of stopping HRT.  Return for pelvic ultrasound.  Guidelines for Calcium, Vitamin D, regular exercise program including cardiovascular and weight bearing exercise. Will check TFTs, vit D.  Rx for Synthroid 88 mcg for one year.  BMD at [facility]. Follow up annually and prn.   After visit summary provided.   20 min  total time was spent for this patient encounter, including preparation, face-to-face counseling with the patient, coordination of care, and documentation of the encounter regarding postmenopausal bleeding, hypothyroidism.

## 2023-02-20 ENCOUNTER — Other Ambulatory Visit: Payer: Self-pay | Admitting: Gastroenterology

## 2023-02-20 ENCOUNTER — Other Ambulatory Visit (HOSPITAL_COMMUNITY): Payer: Self-pay

## 2023-02-25 NOTE — Progress Notes (Unsigned)
[name] [address] [place] [address] Phone: [phone] Subjective:   Shelly [name], am serving as a scribe for Dr. Antoine Primas.  I'm seeing this patient by the request  of:  McElwee, Lauren A, NP  CC: Neck and back pain follow-up  [id number]  Shelly [name] is a 62 y.o. female coming in with complaint of back and neck pain. OMT on 01/01/2023. Patient states that she continues to have neck and lower back pain that is intermittent.  Feels like it is may be associated with her estrogen.  Feels like she is having some breakthrough pain from time to time.  Medications patient has been prescribed:   Taking:         Reviewed prior external information including notes and imaging from previsou exam, outside providers and external EMR if available.   As well as notes that were available from care everywhere and other healthcare systems.  Past medical history, social, surgical and family history all reviewed in electronic medical record.  No pertanent information unless stated regarding to the chief complaint.   Past Medical History:  Diagnosis Date   Depression    Endometrial mass    Endometriosis    Fibroid, uterine    GERD (gastroesophageal reflux disease)    Heart murmur    History of adenomatous polyp of colon 2015   History of kidney stones    History of migraine    Hypothyroidism    followed by pcp   Insomnia 08/28/2016   Osteoarthritis 08/28/2016   feet, hands, knees   PMB (postmenopausal bleeding)    Sicca syndrome with keratoconjunctivitis (HCC) 08/28/2016   Somatic dysfunction    cervical , lumbar, sacral , rib cage region   Vitamin D deficiency 08/28/2016    Allergies  Allergen Reactions   Compazine [Prochlorperazine Edisylate] Other (See Comments)    Muscle contractions     Review of Systems:  No headache, visual changes, nausea, vomiting, diarrhea, constipation, dizziness, abdominal pain, skin rash,  fevers, chills, night sweats, weight loss, swollen lymph nodes, body aches, joint swelling, chest pain, shortness of breath, mood changes. POSITIVE muscle aches  Objective  Blood pressure (!) 140/82, pulse 80, height 5' 5.5" (1.664 m), weight 171 lb (77.6 kg), last menstrual period 02/22/2014, SpO2 99 %.   General: No apparent distress alert and oriented x3 mood and affect normal, dressed appropriately.  HEENT: Pupils equal, extraocular movements intact  Respiratory: Patient's speak in full sentences and does not appear short of breath  Cardiovascular: No lower extremity edema, non tender, no erythema  Neck exam does have some tightness noted with sidebending bilaterally.  Patient does have some difficulty with extension noted as well.  Osteopathic findings  C6 flexed rotated and side bent left C7 flexed rotated and side bent right T3 extended rotated and side bent right inhaled rib T9 extended rotated and side bent left L1 flexed rotated and side bent right L4 flexed rotated and side bent left Sacrum right on right       Assessment and Plan:  Scapular dyskinesis Some worsening scapular dyskinesis.  Patient is going to be following up with her OB/GYN to discuss again about any type of supplementation.  Patient believes it is secondary to estrogen but I do not see any significant testing at the moment.  We discussed other medications.  Patient can take anti-inflammatories over-the-counter.  Will continue to monitor and see patient again in 6 to 8 weeks otherwise.  Nonallopathic problems  Decision today to treat with OMT was based on Physical Exam  After verbal consent patient was treated with HVLA, ME, FPR techniques in cervical, rib, thoracic, lumbar, and sacral  areas  Patient tolerated the procedure well with improvement in symptoms  Patient given exercises, stretches and lifestyle modifications  See medications in patient instructions if given  Patient will follow up  in 4-8 weeks     The above documentation has been reviewed and is accurate and complete Judi Saa, DO         Note: This dictation was prepared with Dragon dictation along with smaller phrase technology. Any transcriptional errors that result from this process are unintentional.

## 2023-02-26 ENCOUNTER — Ambulatory Visit (INDEPENDENT_AMBULATORY_CARE_PROVIDER_SITE_OTHER): Payer: 59 | Admitting: Family Medicine

## 2023-02-26 ENCOUNTER — Encounter: Payer: Self-pay | Admitting: Family Medicine

## 2023-02-26 VITALS — BP 140/82 | HR 80 | Ht 65.5 in | Wt 171.0 lb

## 2023-02-26 DIAGNOSIS — M9903 Segmental and somatic dysfunction of lumbar region: Secondary | ICD-10-CM | POA: Diagnosis not present

## 2023-02-26 DIAGNOSIS — M9904 Segmental and somatic dysfunction of sacral region: Secondary | ICD-10-CM

## 2023-02-26 DIAGNOSIS — M9902 Segmental and somatic dysfunction of thoracic region: Secondary | ICD-10-CM | POA: Diagnosis not present

## 2023-02-26 DIAGNOSIS — G2589 Other specified extrapyramidal and movement disorders: Secondary | ICD-10-CM

## 2023-02-26 DIAGNOSIS — M9901 Segmental and somatic dysfunction of cervical region: Secondary | ICD-10-CM

## 2023-02-26 DIAGNOSIS — M9908 Segmental and somatic dysfunction of rib cage: Secondary | ICD-10-CM

## 2023-02-26 NOTE — Patient Instructions (Signed)
Good to see you See if they can get hormones under control See me in 6-8 weeks

## 2023-02-26 NOTE — Assessment & Plan Note (Signed)
Some worsening scapular dyskinesis.  Patient is going to be following up with her OB/GYN to discuss again about any type of supplementation.  Patient believes it is secondary to estrogen but I do not see any significant testing at the moment.  We discussed other medications.  Patient can take anti-inflammatories over-the-counter.  Will continue to monitor and see patient again in 6 to 8 weeks otherwise.

## 2023-03-04 ENCOUNTER — Other Ambulatory Visit (HOSPITAL_COMMUNITY): Payer: Self-pay

## 2023-03-04 ENCOUNTER — Other Ambulatory Visit (HOSPITAL_COMMUNITY)
Admission: RE | Admit: 2023-03-04 | Discharge: 2023-03-04 | Disposition: A | Discharge status: Home / Self Care | Payer: 59 | Source: Ambulatory Visit | Attending: Obstetrics and Gynecology | Admitting: Obstetrics and Gynecology

## 2023-03-04 ENCOUNTER — Encounter: Payer: Self-pay | Admitting: Obstetrics and Gynecology

## 2023-03-04 ENCOUNTER — Ambulatory Visit (INDEPENDENT_AMBULATORY_CARE_PROVIDER_SITE_OTHER): Payer: 59 | Admitting: Obstetrics and Gynecology

## 2023-03-04 VITALS — BP 126/74 | HR 72 | Ht 66.5 in | Wt 171.0 lb

## 2023-03-04 DIAGNOSIS — Z0000 Encounter for general adult medical examination without abnormal findings: Secondary | ICD-10-CM

## 2023-03-04 DIAGNOSIS — Z124 Encounter for screening for malignant neoplasm of cervix: Secondary | ICD-10-CM

## 2023-03-04 DIAGNOSIS — E785 Hyperlipidemia, unspecified: Secondary | ICD-10-CM | POA: Diagnosis not present

## 2023-03-04 DIAGNOSIS — M8589 Other specified disorders of bone density and structure, multiple sites: Secondary | ICD-10-CM | POA: Diagnosis not present

## 2023-03-04 DIAGNOSIS — E038 Other specified hypothyroidism: Secondary | ICD-10-CM | POA: Diagnosis not present

## 2023-03-04 DIAGNOSIS — Z01419 Encounter for gynecological examination (general) (routine) without abnormal findings: Secondary | ICD-10-CM | POA: Diagnosis not present

## 2023-03-04 DIAGNOSIS — Z113 Encounter for screening for infections with a predominantly sexual mode of transmission: Secondary | ICD-10-CM

## 2023-03-04 DIAGNOSIS — N950 Postmenopausal bleeding: Secondary | ICD-10-CM

## 2023-03-04 DIAGNOSIS — D72819 Decreased white blood cell count, unspecified: Secondary | ICD-10-CM | POA: Diagnosis not present

## 2023-03-04 DIAGNOSIS — E559 Vitamin D deficiency, unspecified: Secondary | ICD-10-CM | POA: Diagnosis not present

## 2023-03-04 MED ORDER — LEVOTHYROXINE SODIUM 88 MCG PO TABS
ORAL_TABLET | ORAL | 3 refills | Status: DC
  Filled 2023-03-04: qty 90, 90d supply, fill #0
  Filled 2023-06-15: qty 90, 90d supply, fill #1
  Filled 2023-09-07: qty 90, 90d supply, fill #2
  Filled 2023-12-09: qty 90, 90d supply, fill #3

## 2023-03-04 NOTE — Patient Instructions (Signed)

## 2023-03-05 LAB — CYTOLOGY - PAP
Adequacy: ABSENT
Chlamydia: NEGATIVE
Comment: NEGATIVE
Comment: NEGATIVE
Comment: NEGATIVE
Comment: NORMAL
Diagnosis: NEGATIVE
High risk HPV: NEGATIVE
Neisseria Gonorrhea: NEGATIVE
Trichomonas: NEGATIVE

## 2023-03-05 LAB — VITAMIN D 25 HYDROXY (VIT D DEFICIENCY, FRACTURES): Vit D, 25-Hydroxy: 68 ng/mL (ref 30–100)

## 2023-03-05 LAB — TSH: TSH: 1.22 mIU/L (ref 0.40–4.50)

## 2023-03-05 LAB — T4, FREE: Free T4: 1.4 ng/dL (ref 0.8–1.8)

## 2023-03-17 ENCOUNTER — Other Ambulatory Visit: Payer: Self-pay | Admitting: Gastroenterology

## 2023-03-18 ENCOUNTER — Other Ambulatory Visit: Payer: Self-pay | Admitting: Gastroenterology

## 2023-03-18 NOTE — Progress Notes (Deleted)
GYNECOLOGY  VISIT   HPI: 62 y.o.   Married  Caucasian  female   G0P0000 with Patient's last menstrual period was 02/22/2014.   here for   U/S consult  GYNECOLOGIC HISTORY: Patient's last menstrual period was 02/22/2014. Contraception:  PMP Menopausal hormone therapy:  estradiol patch and progesterone Last mammogram:  01/15/22 Breast Density Cat C, BI-RADS CAT 1 neg  Last pap smear:   03/04/23 neg: HR HPV neg, 02/28/22 neg: HR HPV neg        OB History     Gravida  0   Para  0   Term  0   Preterm  0   AB  0   Living  0      SAB  0   IAB  0   Ectopic  0   Multiple  0   Live Births           Obstetric Comments  Has 2 adopted children            Patient Active Problem List   Diagnosis Date Noted   Achilles tendinitis of both lower extremities 01/02/2023   Post-menopausal bleeding 01/02/2022   White coat syndrome with high blood pressure but without hypertension 01/02/2022   Left knee pain 06/01/2021   Aortic atherosclerosis (HCC) 01/25/2021   History of nephrolithiasis 01/19/2021   Anxiety 08/11/2019   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   Cubital tunnel syndrome 10/07/2018   Nonallopathic lesion of rib cage 07/08/2018   Nonallopathic lesion of sacral region 07/08/2018   Osteoarthritis of both hands 08/28/2016   Osteoarthritis of both feet 08/28/2016   Osteoarthritis of both knees 08/28/2016   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   Vitamin D deficiency 08/28/2016   Chronic neck pain 03/01/2016   Scapular dyskinesis 03/01/2016   Nonallopathic lesion of cervical region 03/01/2016   Nonallopathic lesion of thoracic region 03/01/2016   Nonallopathic lesion of lumbosacral region 03/01/2016   Family history of abdominal aortic aneurysm 08/12/2012   Renal calculus, left 06/04/2012   GERD (gastroesophageal reflux disease) 05/01/2012   History of migraine 05/01/2012   Hypothyroidism 05/01/2012   Leukopenia 05/01/2012   Allergic rhinitis due to allergen 05/01/2012     Past Medical History:  Diagnosis Date   Depression    Endometrial mass    Endometriosis    Fibroid, uterine    GERD (gastroesophageal reflux disease)    Heart murmur    History of adenomatous polyp of colon 2015   History of kidney stones    History of migraine    Hypothyroidism    followed by pcp   Insomnia 08/28/2016   Osteoarthritis 08/28/2016   feet, hands, knees   PMB (postmenopausal bleeding)    Sicca syndrome with keratoconjunctivitis (HCC) 08/28/2016   Somatic dysfunction    cervical , lumbar, sacral , rib cage region   Vitamin D deficiency 08/28/2016    Past Surgical History:  Procedure Laterality Date   COLONOSCOPY WITH ESOPHAGOGASTRODUODENOSCOPY (EGD)  04/11/2021   dr stark   DIAGNOSTIC LAPAROSCOPY     1993  and 1994  w/ laser ablation endometriosis   DILATATION & CURETTAGE/HYSTEROSCOPY WITH MYOSURE N/A 09/23/2022   Procedure: DILATATION & CURETTAGE/HYSTEROSCOPY WITH MYOSURE;  Surgeon: Patton Salles, MD;  Location: [facility];  Service: Gynecology;  Laterality: N/A;    Current Outpatient Medications  Medication Sig Dispense Refill   baclofen (LIORESAL) 10 MG tablet Take 1 tablet (10 mg total) by mouth 3 (three) times daily as needed  for muscle pain 90 tablet 0   Cholecalciferol (VITAMIN D3) 250 MCG (10000 UT) capsule Take 10,000 Units by mouth daily.     estradiol (DOTTI) 0.05 MG/24HR patch Place 1 patch onto the skin twice a week 24 patch 0   fluticasone (FLONASE) 50 MCG/ACT nasal spray Place 2 sprays into both nostrils daily. 16 g 6   Krill Oil 500 MG CAPS Take 1 capsule by mouth daily.     levothyroxine (SYNTHROID) 88 MCG tablet Take 1 tablet by mouth once daily 90 tablet 3   nabumetone (RELAFEN) 500 MG tablet Take 1 tablet (500 mg total) by mouth daily. (Patient taking differently: Take 500 mg by mouth daily as needed.) 90 tablet 0   pantoprazole (PROTONIX) 40 MG tablet Take 1 tablet (40 mg total) by mouth daily. 30 tablet 0    progesterone (PROMETRIUM) 100 MG capsule Take 1 capsule (100 mg total) by mouth daily at bedtime. 90 capsule 0   triamcinolone cream (KENALOG) 0.1 % Apply topically 2 times a day for up to 3 weeks in a row 80 g 0   No current facility-administered medications for this visit.     ALLERGIES: Compazine [prochlorperazine edisylate]  Family History  Problem Relation Age of Onset   Hypertension Mother    Dementia Mother    Hypertension Father    Stroke Father    Heart disease Father    Hypertension Brother    Cancer Maternal Aunt 42       breast   Aneurysm Maternal Aunt    Breast cancer Maternal Aunt    Hypertension Maternal Grandmother    Hypertension Maternal Grandfather    Kidney failure Maternal Grandfather    Aneurysm Maternal Grandfather    Cancer Cousin        breast   Breast cancer Cousin    Colon polyps Neg Hx    Esophageal cancer Neg Hx    Pancreatic cancer Neg Hx    Stomach cancer Neg Hx     Social History   Socioeconomic History   Marital status: Married    Spouse name: Not on file   Number of children: 2   Years of education: Not on file   Highest education level: Not on file  Occupational History   Occupation: Teacher, adult education: [employer]  Tobacco Use   Smoking status: Never   Smokeless tobacco: Never  Vaping Use   Vaping Use: Never used  Substance and Sexual Activity   Alcohol use: Not Currently    Comment: rarely   Drug use: Never   Sexual activity: Yes    Partners: Male    Birth control/protection: Post-menopausal  Other Topics Concern   Not on file  Social History Narrative   Not on file   Social Determinants of Health   Financial Resource Strain: Not on file  Food Insecurity: Not on file  Transportation Needs: Not on file  Physical Activity: Not on file  Stress: Not on file  Social Connections: Not on file  Intimate Partner Violence: Not on file    Review of Systems  PHYSICAL EXAMINATION:    LMP 02/22/2014     General  appearance: alert, cooperative and appears stated age Head: Normocephalic, without obvious abnormality, atraumatic Neck: no adenopathy, supple, symmetrical, trachea midline and thyroid normal to inspection and palpation Lungs: clear to auscultation bilaterally Breasts: normal appearance, no masses or tenderness, No nipple retraction or dimpling, No nipple discharge or bleeding, No axillary or supraclavicular adenopathy  Heart: regular rate and rhythm Abdomen: soft, non-tender, no masses,  no organomegaly Extremities: extremities normal, atraumatic, no cyanosis or edema Skin: Skin color, texture, turgor normal. No rashes or lesions Lymph nodes: Cervical, supraclavicular, and axillary nodes normal. No abnormal inguinal nodes palpated Neurologic: Grossly normal  Pelvic: External genitalia:  no lesions              Urethra:  normal appearing urethra with no masses, tenderness or lesions              Bartholins and Skenes: normal                 Vagina: normal appearing vagina with normal color and discharge, no lesions              Cervix: no lesions                Bimanual Exam:  Uterus:  normal size, contour, position, consistency, mobility, non-tender              Adnexa: no mass, fullness, tenderness              Rectal exam: {yes no:314532}.  Confirms.              Anus:  normal sphincter tone, no lesions  Chaperone was present for exam:  ***  ASSESSMENT     PLAN     An After Visit Summary was printed and given to the patient.  ______ minutes face to face time of which over 50% was spent in counseling.

## 2023-03-19 ENCOUNTER — Other Ambulatory Visit: Payer: Self-pay | Admitting: Gastroenterology

## 2023-03-20 ENCOUNTER — Other Ambulatory Visit: Payer: Self-pay

## 2023-03-20 ENCOUNTER — Other Ambulatory Visit (HOSPITAL_COMMUNITY): Payer: Self-pay

## 2023-03-20 ENCOUNTER — Other Ambulatory Visit: Payer: Self-pay | Admitting: Gastroenterology

## 2023-03-28 ENCOUNTER — Other Ambulatory Visit (HOSPITAL_COMMUNITY): Payer: Self-pay

## 2023-04-01 ENCOUNTER — Other Ambulatory Visit: Payer: 59

## 2023-04-01 ENCOUNTER — Other Ambulatory Visit: Payer: 59 | Admitting: Obstetrics and Gynecology

## 2023-04-14 ENCOUNTER — Other Ambulatory Visit: Payer: Self-pay | Admitting: Obstetrics and Gynecology

## 2023-04-14 DIAGNOSIS — Z1231 Encounter for screening mammogram for malignant neoplasm of breast: Secondary | ICD-10-CM

## 2023-04-14 NOTE — Progress Notes (Unsigned)
[name] [address] [place] [address] Phone: [phone] Subjective:   INadine Counts, am serving as a scribe for Dr. Antoine Primas.  I'm seeing this patient by the request  of:  McElwee, Lauren A, NP  CC: Neck and back pain follow-up  [id number]  Shelly [name] is a 62 y.o. female coming in with complaint of back and neck pain. OMT on 02/26/2023. Patient states same per usual. No new concerns.  Having neck and back pain noted.         Reviewed prior external information including notes and imaging from previsou exam, outside providers and external EMR if available.   As well as notes that were available from care everywhere and other healthcare systems.  Past medical history, social, surgical and family history all reviewed in electronic medical record.  No pertanent information unless stated regarding to the chief complaint.   Past Medical History:  Diagnosis Date   Depression    Endometrial mass    Endometriosis    Fibroid, uterine    GERD (gastroesophageal reflux disease)    Heart murmur    History of adenomatous polyp of colon 2015   History of kidney stones    History of migraine    Hypothyroidism    followed by pcp   Insomnia 08/28/2016   Osteoarthritis 08/28/2016   feet, hands, knees   PMB (postmenopausal bleeding)    Sicca syndrome with keratoconjunctivitis (HCC) 08/28/2016   Somatic dysfunction    cervical , lumbar, sacral , rib cage region   Vitamin D deficiency 08/28/2016    Allergies  Allergen Reactions   Compazine [Prochlorperazine Edisylate] Other (See Comments)    Muscle contractions     Review of Systems:  No headache, visual changes, nausea, vomiting, diarrhea, constipation, dizziness, abdominal pain, skin rash, fevers, chills, night sweats, weight loss, swollen lymph nodes, body aches, joint swelling, chest pain, shortness of breath, mood changes. POSITIVE muscle aches  Objective  Blood  pressure (!) 130/90, pulse 77, height 5\' 6"  (1.676 m), weight 170 lb (77.1 kg), last menstrual period 02/22/2014, SpO2 97 %.   General: No apparent distress alert and oriented x3 mood and affect normal, dressed appropriately.  HEENT: Pupils equal, extraocular movements intact  Respiratory: Patient's speak in full sentences and does not appear short of breath  Cardiovascular: No lower extremity edema, non tender, no erythema  Neck exam does have some loss of lordosis.  Tightness noted in the parascapular area right greater than left.  Mild crepitus noted. Back exam does have some loss of lordosis as well.  Osteopathic findings  C2 flexed rotated and side bent right C7 flexed rotated and side bent right T3 extended rotated and side bent right inhaled rib T8 extended rotated and side bent right L1 flexed rotated and side bent right Sacrum right on right    Assessment and Plan:  Scapular dyskinesis Scattered dyskinesis and seems to be more secondary to posture and ergonomics but also seems to be associated with stress.  Discussed icing regimen and home exercises, discussed which activities to do and which ones to avoid.  Increase activity slowly.  Follow-up again in 6 to 8 weeks.    Nonallopathic problems  Decision today to treat with OMT was based on Physical Exam  After verbal consent patient was treated with HVLA, ME, FPR techniques in cervical, rib, thoracic, lumbar, and sacral  areas  Patient tolerated the procedure well with improvement in symptoms  Patient  given exercises, stretches and lifestyle modifications  See medications in patient instructions if given  Patient will follow up in 4-8 weeks     The above documentation has been reviewed and is accurate and complete Judi Saa, DO         Note: This dictation was prepared with Dragon dictation along with smaller phrase technology. Any transcriptional errors that result from this process are unintentional.

## 2023-04-15 ENCOUNTER — Encounter: Payer: Self-pay | Admitting: Family Medicine

## 2023-04-15 ENCOUNTER — Ambulatory Visit (INDEPENDENT_AMBULATORY_CARE_PROVIDER_SITE_OTHER): Payer: 59 | Admitting: Family Medicine

## 2023-04-15 VITALS — BP 130/90 | HR 77 | Ht 66.0 in | Wt 170.0 lb

## 2023-04-15 DIAGNOSIS — M9908 Segmental and somatic dysfunction of rib cage: Secondary | ICD-10-CM

## 2023-04-15 DIAGNOSIS — M9902 Segmental and somatic dysfunction of thoracic region: Secondary | ICD-10-CM

## 2023-04-15 DIAGNOSIS — M9904 Segmental and somatic dysfunction of sacral region: Secondary | ICD-10-CM

## 2023-04-15 DIAGNOSIS — M9903 Segmental and somatic dysfunction of lumbar region: Secondary | ICD-10-CM | POA: Diagnosis not present

## 2023-04-15 DIAGNOSIS — G2589 Other specified extrapyramidal and movement disorders: Secondary | ICD-10-CM | POA: Diagnosis not present

## 2023-04-15 DIAGNOSIS — M9901 Segmental and somatic dysfunction of cervical region: Secondary | ICD-10-CM

## 2023-04-15 NOTE — Patient Instructions (Signed)
Good to see you! Keep working for the little ones See you again in 7-8 weeks

## 2023-04-15 NOTE — Assessment & Plan Note (Signed)
Scattered dyskinesis and seems to be more secondary to posture and ergonomics but also seems to be associated with stress.  Discussed icing regimen and home exercises, discussed which activities to do and which ones to avoid.  Increase activity slowly.  Follow-up again in 6 to 8 weeks.

## 2023-04-16 ENCOUNTER — Other Ambulatory Visit: Payer: Self-pay

## 2023-04-16 DIAGNOSIS — Z79890 Hormone replacement therapy (postmenopausal): Secondary | ICD-10-CM

## 2023-04-16 NOTE — Telephone Encounter (Signed)
Medication refill request: estradiol 0.05mg  patch Last AEX:  03-04-23 Next AEX: pt cancelled appt for 04-01-23 & has not rescheduled Last MMG (if hormonal medication request): 01-15-22 birads 1:neg Refill authorized: rx denied & pharmacy note placed for patient to call to schedule yearly exam. Previous refill to pharmacy stated patient needed to keep appt for future refills.

## 2023-05-13 ENCOUNTER — Encounter: Payer: Self-pay | Admitting: Nurse Practitioner

## 2023-05-13 ENCOUNTER — Ambulatory Visit (INDEPENDENT_AMBULATORY_CARE_PROVIDER_SITE_OTHER): Payer: 59 | Admitting: Nurse Practitioner

## 2023-05-13 ENCOUNTER — Ambulatory Visit: Admission: RE | Admit: 2023-05-13 | Payer: 59 | Source: Ambulatory Visit

## 2023-05-13 ENCOUNTER — Other Ambulatory Visit (HOSPITAL_COMMUNITY): Payer: Self-pay

## 2023-05-13 VITALS — BP 126/82 | HR 75 | Temp 97.8°F | Ht 66.0 in | Wt 170.0 lb

## 2023-05-13 DIAGNOSIS — Z0000 Encounter for general adult medical examination without abnormal findings: Secondary | ICD-10-CM | POA: Diagnosis not present

## 2023-05-13 DIAGNOSIS — Z23 Encounter for immunization: Secondary | ICD-10-CM

## 2023-05-13 DIAGNOSIS — N951 Menopausal and female climacteric states: Secondary | ICD-10-CM | POA: Diagnosis not present

## 2023-05-13 DIAGNOSIS — K2100 Gastro-esophageal reflux disease with esophagitis, without bleeding: Secondary | ICD-10-CM | POA: Diagnosis not present

## 2023-05-13 DIAGNOSIS — M542 Cervicalgia: Secondary | ICD-10-CM

## 2023-05-13 DIAGNOSIS — I700 Atherosclerosis of aorta: Secondary | ICD-10-CM | POA: Diagnosis not present

## 2023-05-13 DIAGNOSIS — E559 Vitamin D deficiency, unspecified: Secondary | ICD-10-CM

## 2023-05-13 DIAGNOSIS — R7301 Impaired fasting glucose: Secondary | ICD-10-CM

## 2023-05-13 DIAGNOSIS — G8929 Other chronic pain: Secondary | ICD-10-CM

## 2023-05-13 DIAGNOSIS — F419 Anxiety disorder, unspecified: Secondary | ICD-10-CM

## 2023-05-13 DIAGNOSIS — Z1231 Encounter for screening mammogram for malignant neoplasm of breast: Secondary | ICD-10-CM

## 2023-05-13 DIAGNOSIS — E7800 Pure hypercholesterolemia, unspecified: Secondary | ICD-10-CM | POA: Diagnosis not present

## 2023-05-13 DIAGNOSIS — E038 Other specified hypothyroidism: Secondary | ICD-10-CM | POA: Diagnosis not present

## 2023-05-13 LAB — LIPID PANEL
Cholesterol: 202 mg/dL — ABNORMAL HIGH (ref 0–200)
HDL: 60.3 mg/dL (ref >39.00)
LDL Cholesterol: 129 mg/dL — ABNORMAL HIGH (ref 0–99)
NonHDL: 141.37
Total CHOL/HDL Ratio: 3
Triglycerides: 62 mg/dL (ref 0.0–149.0)
VLDL: 12.4 mg/dL (ref 0.0–40.0)

## 2023-05-13 LAB — HEMOGLOBIN A1C: Hgb A1c MFr Bld: 6 % (ref 4.6–6.5)

## 2023-05-13 LAB — COMPREHENSIVE METABOLIC PANEL
ALT: 20 U/L (ref 0–35)
AST: 16 U/L (ref 0–37)
Albumin: 4.6 g/dL (ref 3.5–5.2)
Alkaline Phosphatase: 68 U/L (ref 39–117)
BUN: 19 mg/dL (ref 6–23)
CO2: 28 mEq/L (ref 19–32)
Calcium: 10 mg/dL (ref 8.4–10.5)
Chloride: 107 mEq/L (ref 96–112)
Creatinine, Ser: 0.64 mg/dL (ref 0.40–1.20)
GFR: 96.05 mL/min (ref >60.00)
Glucose, Bld: 101 mg/dL — ABNORMAL HIGH (ref 70–99)
Potassium: 4.8 mEq/L (ref 3.5–5.1)
Sodium: 140 mEq/L (ref 135–145)
Total Bilirubin: 0.5 mg/dL (ref 0.2–1.2)
Total Protein: 7.3 g/dL (ref 6.0–8.3)

## 2023-05-13 LAB — CBC WITH DIFFERENTIAL/PLATELET
Basophils Absolute: 0 10*3/uL (ref 0.0–0.1)
Basophils Relative: 0.4 % (ref 0.0–3.0)
Eosinophils Absolute: 0.1 10*3/uL (ref 0.0–0.7)
Eosinophils Relative: 1.4 % (ref 0.0–5.0)
HCT: 42.7 % (ref 36.0–46.0)
Hemoglobin: 14.1 g/dL (ref 12.0–15.0)
Lymphocytes Relative: 33.3 % (ref 12.0–46.0)
Lymphs Abs: 1.2 10*3/uL (ref 0.7–4.0)
MCHC: 33.1 g/dL (ref 30.0–36.0)
MCV: 96 fl (ref 78.0–100.0)
Monocytes Absolute: 0.4 10*3/uL (ref 0.1–1.0)
Monocytes Relative: 10.4 % (ref 3.0–12.0)
Neutro Abs: 2 10*3/uL (ref 1.4–7.7)
Neutrophils Relative %: 54.5 % (ref 43.0–77.0)
Platelets: 257 10*3/uL (ref 150.0–400.0)
RBC: 4.45 Mil/uL (ref 3.87–5.11)
RDW: 13 % (ref 11.5–15.5)
WBC: 3.6 10*3/uL — ABNORMAL LOW (ref 4.0–10.5)

## 2023-05-13 LAB — HM HIV SCREENING LAB: HM HIV Screening: NEGATIVE

## 2023-05-13 MED ORDER — PANTOPRAZOLE SODIUM 40 MG PO TBEC
40.0000 mg | DELAYED_RELEASE_TABLET | Freq: Every day | ORAL | 3 refills | Status: AC
  Filled 2023-05-13: qty 90, 90d supply, fill #0
  Filled 2023-08-03: qty 90, 90d supply, fill #1
  Filled 2023-11-20: qty 90, 90d supply, fill #2
  Filled 2024-02-23: qty 90, 90d supply, fill #3

## 2023-05-13 MED ORDER — TRIAMCINOLONE ACETONIDE 0.1 % EX CREA
TOPICAL_CREAM | Freq: Two times a day (BID) | CUTANEOUS | 0 refills | Status: DC
  Filled 2023-05-13: qty 80, 21d supply, fill #0

## 2023-05-13 NOTE — Assessment & Plan Note (Signed)
Chronic, ongoing.  She has been following with GYN.  She was doing hormone replacement therapy, however was having some postmenopausal vaginal bleeding.  She has since stopped her hormones and the vaginal bleeding has stopped, however she is still having trouble with hot flashes and trouble sleeping.  Continue following with GYN.

## 2023-05-13 NOTE — Assessment & Plan Note (Signed)
Noted on CT scan 12/2020. Prior LDL 107.  Check CMP, CBC, lipid panel today.

## 2023-05-13 NOTE — Assessment & Plan Note (Signed)
Chronic, stable.  She states that her mood tends to fluctuate and has slightly worsened since coming off of the hormones.  Discussed medication like venlafaxine to help with hot flashes as well as mood, however she would like to hold off for now.  Follow-up with any concerns.

## 2023-05-13 NOTE — Assessment & Plan Note (Signed)
Chronic, stable. Continue vitamin D supplement daily.  Check vitamin D levels today and adjust regimen based on results

## 2023-05-13 NOTE — Progress Notes (Signed)
BP 126/82 (BP Location: Left Arm)   Pulse 75   Temp 97.8 F (36.6 C)   Ht 5\' 6"  (1.676 m)   Wt 170 lb (77.1 kg)   LMP 02/22/2014   SpO2 99%   BMI 27.44 kg/m    Subjective:    Patient ID: Shelly [name], female    DOB: [birth year], 62 y.o.   MRN: [id number]  CC: Chief Complaint  Patient presents with   Annual Exam    With fasting labs, Rx refills    HPI: Shelly [name] is a 62 y.o. female presenting on 05/13/2023 for comprehensive medical examination. Current medical complaints include:none  She currently lives with: Menopausal Symptoms: yes - hot flashes, vaginal dryness  Depression and Anxiety Screen done today and results listed below:     01/02/2022   10:52 AM 01/19/2021    3:54 PM  Depression screen PHQ 2/9  Decreased Interest 1 0  Down, Depressed, Hopeless 0 0  PHQ - 2 Score 1 0  Altered sleeping 0   Tired, decreased energy 2   Change in appetite 0   Feeling bad or failure about yourself  0   Trouble concentrating 0   Moving slowly or fidgety/restless 0   Suicidal thoughts 0   PHQ-9 Score 3       01/02/2022   10:53 AM  GAD 7 : Generalized Anxiety Score  Nervous, Anxious, on Edge 1  Control/stop worrying 1  Worry too much - different things 2  Trouble relaxing 0  Restless 0  Easily annoyed or irritable 0  Afraid - awful might happen 0  Total GAD 7 Score 4    The patient does not have a history of falls. I did not complete a risk assessment for falls. A plan of care for falls was not documented.   Past Medical History:  Past Medical History:  Diagnosis Date   Depression    Endometrial mass    Endometriosis    Fibroid, uterine    GERD (gastroesophageal reflux disease)    Heart murmur    History of adenomatous polyp of colon 2015   History of kidney stones    History of migraine    Hypothyroidism    followed by pcp   Insomnia 08/28/2016   Osteoarthritis 08/28/2016   feet, hands, knees   PMB (postmenopausal bleeding)    Sicca syndrome with  keratoconjunctivitis (HCC) 08/28/2016   Somatic dysfunction    cervical , lumbar, sacral , rib cage region   Vitamin D deficiency 08/28/2016    Surgical History:  Past Surgical History:  Procedure Laterality Date   COLONOSCOPY WITH ESOPHAGOGASTRODUODENOSCOPY (EGD)  04/11/2021   dr stark   DIAGNOSTIC LAPAROSCOPY     1993  and 1994  w/ laser ablation endometriosis   DILATATION & CURETTAGE/HYSTEROSCOPY WITH MYOSURE N/A 09/23/2022   Procedure: DILATATION & CURETTAGE/HYSTEROSCOPY WITH MYOSURE;  Surgeon: Patton Salles, MD;  Location: [facility];  Service: Gynecology;  Laterality: N/A;    Medications:  Current Outpatient Medications on File Prior to Visit  Medication Sig   baclofen (LIORESAL) 10 MG tablet Take 1 tablet (10 mg total) by mouth 3 (three) times daily as needed for muscle pain   Cholecalciferol (VITAMIN D3) 250 MCG (10000 UT) capsule Take 10,000 Units by mouth daily.   fluticasone (FLONASE) 50 MCG/ACT nasal spray Place 2 sprays into both nostrils daily.   Krill Oil 500 MG CAPS Take 1 capsule by mouth daily.  levothyroxine (SYNTHROID) 88 MCG tablet Take 1 tablet by mouth once daily   nabumetone (RELAFEN) 500 MG tablet Take 1 tablet (500 mg total) by mouth daily. (Patient taking differently: Take 500 mg by mouth daily as needed.)   No current facility-administered medications on file prior to visit.    Allergies:  Allergies  Allergen Reactions   Compazine [Prochlorperazine Edisylate] Other (See Comments)    Muscle contractions    Social History:  Social History   Socioeconomic History   Marital status: Married    Spouse name: Not on file   Number of children: 2   Years of education: Not on file   Highest education level: Not on file  Occupational History   Occupation: Charity fundraiser    Employer: [employer]  Tobacco Use   Smoking status: Never   Smokeless tobacco: Never  Vaping Use   Vaping status: Never Used  Substance and Sexual Activity    Alcohol use: Not Currently    Comment: rarely   Drug use: Never   Sexual activity: Yes    Partners: Male    Birth control/protection: Post-menopausal  Other Topics Concern   Not on file  Social History Narrative   Not on file   Social Determinants of Health   Financial Resource Strain: Not on file  Food Insecurity: Not on file  Transportation Needs: Not on file  Physical Activity: Not on file  Stress: Not on file  Social Connections: Not on file  Intimate Partner Violence: Not on file   Social History   Tobacco Use  Smoking Status Never  Smokeless Tobacco Never   Social History   Substance and Sexual Activity  Alcohol Use Not Currently   Comment: rarely    Family History:  Family History  Problem Relation Age of Onset   Hypertension Mother    Dementia Mother    Hypertension Father    Stroke Father    Heart disease Father    Hypertension Brother    Cancer Maternal Aunt 42       breast   Aneurysm Maternal Aunt    Breast cancer Maternal Aunt    Hypertension Maternal Grandmother    Hypertension Maternal Grandfather    Kidney failure Maternal Grandfather    Aneurysm Maternal Grandfather    Cancer Cousin        breast   Breast cancer Cousin    Colon polyps Neg Hx    Esophageal cancer Neg Hx    Pancreatic cancer Neg Hx    Stomach cancer Neg Hx     Past medical history, surgical history, medications, allergies, family history and social history reviewed with patient today and changes made to appropriate areas of the chart.   Review of Systems  Constitutional:  Positive for malaise/fatigue. Negative for weight loss.  HENT: Negative.    Respiratory: Negative.    Cardiovascular: Negative.   Gastrointestinal: Negative.   Genitourinary: Negative.   Musculoskeletal:  Positive for joint pain.  Skin: Negative.   Neurological:  Positive for headaches (tension). Negative for dizziness.  Psychiatric/Behavioral:  The patient is nervous/anxious.        Sleep  disturbance due to hot flashes   All other ROS negative except what is listed above and in the HPI.      Objective:    BP 126/82 (BP Location: Left Arm)   Pulse 75   Temp 97.8 F (36.6 C)   Ht 5\' 6"  (1.676 m)   Wt 170 lb (77.1 kg)  LMP 02/22/2014   SpO2 99%   BMI 27.44 kg/m   Wt Readings from Last 3 Encounters:  05/13/23 170 lb (77.1 kg)  04/15/23 170 lb (77.1 kg)  03/04/23 171 lb (77.6 kg)    Physical Exam Vitals and nursing note reviewed.  Constitutional:      General: She is not in acute distress.    Appearance: Normal appearance.  HENT:     Head: Normocephalic and atraumatic.     Right Ear: Tympanic membrane, ear canal and external ear normal.     Left Ear: Tympanic membrane, ear canal and external ear normal.  Eyes:     Conjunctiva/sclera: Conjunctivae normal.  Cardiovascular:     Rate and Rhythm: Normal rate and regular rhythm.     Pulses: Normal pulses.     Heart sounds: Normal heart sounds.  Pulmonary:     Effort: Pulmonary effort is normal.     Breath sounds: Normal breath sounds.  Abdominal:     Palpations: Abdomen is soft.     Tenderness: There is no abdominal tenderness.  Musculoskeletal:        General: Normal range of motion.     Cervical back: Normal range of motion and neck supple.     Right lower leg: No edema.     Left lower leg: No edema.  Lymphadenopathy:     Cervical: No cervical adenopathy.  Skin:    General: Skin is warm and dry.  Neurological:     General: No focal deficit present.     Mental Status: She is alert and oriented to person, place, and time.     Cranial Nerves: No cranial nerve deficit.     Coordination: Coordination normal.     Gait: Gait normal.  Psychiatric:        Mood and Affect: Mood normal.        Behavior: Behavior normal.        Thought Content: Thought content normal.        Judgment: Judgment normal.     Results for orders placed or performed in visit on 05/13/23  [facility] HIV SCREENING LAB  Result Value Ref  Range   [facility] HIV Screening Negative - Patient reported       Assessment & Plan:   Problem List Items Addressed This Visit       Cardiovascular and Mediastinum   Aortic atherosclerosis (HCC)    Noted on CT scan 12/2020. Prior LDL 107.  Check CMP, CBC, lipid panel today.      Hot flashes due to menopause    Chronic, ongoing.  She has been following with GYN.  She was doing hormone replacement therapy, however was having some postmenopausal vaginal bleeding.  She has since stopped her hormones and the vaginal bleeding has stopped, however she is still having trouble with hot flashes and trouble sleeping.  Continue following with GYN.          Digestive   GERD (gastroesophageal reflux disease)    Chronic, stable. Continue pantoprazole 40mg  daily and collaboration and recommendations from GI.       Relevant Medications   pantoprazole (PROTONIX) 40 MG tablet     Endocrine   Hypothyroidism    Chronic, stable.  Continue levothyroxine 88 mcg daily.  Check TSH today and adjust regimen based on results.        Other   Chronic neck pain    Chronic, stable. Follows with Dr. Katrinka Blazing with sports medicine. Continue collaboration and recommendations  from Dr. Katrinka Blazing.       Vitamin D deficiency    Chronic, stable. Continue vitamin D supplement daily.  Check vitamin D levels today and adjust regimen based on results      Anxiety    Chronic, stable.  She states that her mood tends to fluctuate and has slightly worsened since coming off of the hormones.  Discussed medication like venlafaxine to help with hot flashes as well as mood, however she would like to hold off for now.  Follow-up with any concerns.      Pure hypercholesterolemia    Chronic, stable.  Last LDL was 107.  Will check CMP, CBC, lipid panel today and adjust regimen based on results.      Relevant Orders   CBC with Differential/Platelet   Lipid panel   Comprehensive metabolic panel   Routine general medical examination at a  health care facility - Primary    Health maintenance reviewed and updated. Discussed nutrition, exercise. Check CMP, CBC today. Follow-up 1 year.        Other Visit Diagnoses     Immunization due       TD booster given today   Relevant Orders   Td vaccine greater than or equal to 7yo preservative free IM (Completed)   IFG (impaired fasting glucose)       Check A1c today   Relevant Orders   Hemoglobin A1c        Follow up plan: Return in about 1 year (around 05/12/2024) for CPE.   LABORATORY TESTING:  - Pap smear: up to date  IMMUNIZATIONS:   - Tdap: Tetanus vaccination status reviewed: Td vaccination indicated and given today. - Influenza: Postponed to flu season - Pneumovax: Not applicable - Prevnar: Not applicable - HPV: Not applicable - Zostavax vaccine: Up to date  SCREENING: -Mammogram:  scheduled for today   - Colonoscopy: Up to date  - Bone Density: Not applicable   PATIENT COUNSELING:   Advised to take 1 mg of folate supplement per day if capable of pregnancy.   Sexuality: Discussed sexually transmitted diseases, partner selection, use of condoms, avoidance of unintended pregnancy  and contraceptive alternatives.   Advised to avoid cigarette smoking.  I discussed with the patient that most people either abstain from alcohol or drink within safe limits (<=14/week and <=4 drinks/occasion for males, <=7/weeks and <= 3 drinks/occasion for females) and that the risk for alcohol disorders and other health effects rises proportionally with the number of drinks per week and how often a drinker exceeds daily limits.  Discussed cessation/primary prevention of drug use and availability of treatment for abuse.   Diet: Encouraged to adjust caloric intake to maintain  or achieve ideal body weight, to reduce intake of dietary saturated fat and total fat, to limit sodium intake by avoiding high sodium foods and not adding table salt, and to maintain adequate dietary  potassium and calcium preferably from fresh fruits, vegetables, and low-fat dairy products.    stressed the importance of regular exercise  Injury prevention: Discussed safety belts, safety helmets, smoke detector, smoking near bedding or upholstery.   Dental health: Discussed importance of regular tooth brushing, flossing, and dental visits.    NEXT PREVENTATIVE PHYSICAL DUE IN 1 YEAR. Return in about 1 year (around 05/12/2024) for CPE.

## 2023-05-13 NOTE — Patient Instructions (Signed)
It was great to see you!  We are checking your labs today and will let you know the results via mychart/phone.   Keep talking with Dr. Edward Jolly   Let's follow-up in 1 year, sooner if you have concerns.  If a referral was placed today, you will be contacted for an appointment. Please note that routine referrals can sometimes take up to 3-4 weeks to process. Please call our office if you haven't heard anything after this time frame.  Take care,  Rodman Pickle, NP

## 2023-05-13 NOTE — Assessment & Plan Note (Signed)
Chronic, stable.  Last LDL was 107.  Will check CMP, CBC, lipid panel today and adjust regimen based on results.

## 2023-05-13 NOTE — Assessment & Plan Note (Signed)
Chronic, stable. Follows with Dr. Tamala Julian with sports medicine. Continue collaboration and recommendations from Dr. Tamala Julian.  ?

## 2023-05-13 NOTE — Assessment & Plan Note (Signed)
Chronic, stable.  Continue levothyroxine 88 mcg daily.  Check TSH today and adjust regimen based on results.

## 2023-05-13 NOTE — Assessment & Plan Note (Signed)
Chronic, stable. Continue pantoprazole '40mg'$  daily and collaboration and recommendations from GI.  ?

## 2023-05-13 NOTE — Assessment & Plan Note (Signed)
Health maintenance reviewed and updated. Discussed nutrition, exercise. Check CMP, CBC today. Follow-up 1 year.   

## 2023-05-15 ENCOUNTER — Other Ambulatory Visit: Payer: Self-pay | Admitting: Obstetrics and Gynecology

## 2023-05-15 DIAGNOSIS — R928 Other abnormal and inconclusive findings on diagnostic imaging of breast: Secondary | ICD-10-CM

## 2023-05-28 ENCOUNTER — Ambulatory Visit
Admission: RE | Admit: 2023-05-28 | Discharge: 2023-05-28 | Disposition: A | Discharge status: Home / Self Care | Payer: 59 | Source: Ambulatory Visit | Attending: Obstetrics and Gynecology | Admitting: Obstetrics and Gynecology

## 2023-05-28 DIAGNOSIS — N6323 Unspecified lump in the left breast, lower outer quadrant: Secondary | ICD-10-CM | POA: Diagnosis not present

## 2023-05-28 DIAGNOSIS — R928 Other abnormal and inconclusive findings on diagnostic imaging of breast: Secondary | ICD-10-CM

## 2023-05-28 DIAGNOSIS — N6012 Diffuse cystic mastopathy of left breast: Secondary | ICD-10-CM | POA: Diagnosis not present

## 2023-06-11 NOTE — Progress Notes (Signed)
[name] [address] [place] [address] Phone: [phone] Subjective:   Bruce Donath, am serving as a scribe for Dr. Antoine Primas.  I'm seeing this patient by the request  of:  McElwee, Lauren A, NP  CC: Back and neck pain follow-up  [id number]  Shelly [name] is a 62 y.o. female coming in with complaint of back and neck pain. OMT on 04/15/2023. Patient states that she is doing ok. Back pain is the same as last visit.   B foot pain. Achilles pain L foot and R foot heel pain. Pain is slowly improving.   Medications patient has been prescribed:   Taking:         Reviewed prior external information including notes and imaging from previsou exam, outside providers and external EMR if available.   As well as notes that were available from care everywhere and other healthcare systems.  Past medical history, social, surgical and family history all reviewed in electronic medical record.  No pertanent information unless stated regarding to the chief complaint.   Past Medical History:  Diagnosis Date   Depression    Endometrial mass    Endometriosis    Fibroid, uterine    GERD (gastroesophageal reflux disease)    Heart murmur    History of adenomatous polyp of colon 2015   History of kidney stones    History of migraine    Hypothyroidism    followed by pcp   Insomnia 08/28/2016   Osteoarthritis 08/28/2016   feet, hands, knees   PMB (postmenopausal bleeding)    Sicca syndrome with keratoconjunctivitis (HCC) 08/28/2016   Somatic dysfunction    cervical , lumbar, sacral , rib cage region   Vitamin D deficiency 08/28/2016    Allergies  Allergen Reactions   Compazine [Prochlorperazine Edisylate] Other (See Comments)    Muscle contractions     Review of Systems:  No headache, visual changes, nausea, vomiting, diarrhea, constipation, dizziness, abdominal pain, skin rash, fevers, chills, night sweats, weight loss, swollen  lymph nodes, body aches, joint swelling, chest pain, shortness of breath, mood changes. POSITIVE muscle aches  Objective  Blood pressure (!) 142/88, pulse 73, height 5\' 6"  (1.676 m), weight 176 lb (79.8 kg), last menstrual period 02/22/2014, SpO2 98%.   General: No apparent distress alert and oriented x3 mood and affect normal, dressed appropriately.  HEENT: Pupils equal, extraocular movements intact  Respiratory: Patient's speak in full sentences and does not appear short of breath  Cardiovascular: No lower extremity edema, non tender, no erythema  Gait MSK:  Back back exam does have some loss lordosis noted.  Some tenderness to palpation in the paraspinal musculature.  Some limited sidebending bilaterally. \Patient's ankles do have  Osteopathic findings  C2 flexed rotated and side bent right T3 extended rotated and side bent right inhaled rib L5 flexed rotated and side bent right Sacrum right on right       Assessment and Plan:  No problem-specific Assessment & Plan notes found for this encounter.    Nonallopathic problems  Decision today to treat with OMT was based on Physical Exam  After verbal consent patient was treated with HVLA, ME, FPR techniques in cervical, rib, thoracic, lumbar, and sacral  areas  Patient tolerated the procedure well with improvement in symptoms  Patient given exercises, stretches and lifestyle modifications  See medications in patient instructions if given  Patient will follow up in 4-8 weeks  Note: This dictation was prepared with Dragon dictation along with smaller phrase technology. Any transcriptional errors that result from this process are unintentional.

## 2023-06-17 ENCOUNTER — Other Ambulatory Visit (HOSPITAL_COMMUNITY): Payer: Self-pay

## 2023-06-17 ENCOUNTER — Encounter: Payer: Self-pay | Admitting: Family Medicine

## 2023-06-17 ENCOUNTER — Ambulatory Visit (INDEPENDENT_AMBULATORY_CARE_PROVIDER_SITE_OTHER): Payer: 59 | Admitting: Family Medicine

## 2023-06-17 VITALS — BP 142/88 | HR 73 | Ht 66.0 in | Wt 176.0 lb

## 2023-06-17 DIAGNOSIS — M9901 Segmental and somatic dysfunction of cervical region: Secondary | ICD-10-CM

## 2023-06-17 DIAGNOSIS — G8929 Other chronic pain: Secondary | ICD-10-CM | POA: Diagnosis not present

## 2023-06-17 DIAGNOSIS — M9904 Segmental and somatic dysfunction of sacral region: Secondary | ICD-10-CM

## 2023-06-17 DIAGNOSIS — M9902 Segmental and somatic dysfunction of thoracic region: Secondary | ICD-10-CM

## 2023-06-17 DIAGNOSIS — M9903 Segmental and somatic dysfunction of lumbar region: Secondary | ICD-10-CM

## 2023-06-17 DIAGNOSIS — M542 Cervicalgia: Secondary | ICD-10-CM | POA: Diagnosis not present

## 2023-06-17 DIAGNOSIS — M9908 Segmental and somatic dysfunction of rib cage: Secondary | ICD-10-CM | POA: Diagnosis not present

## 2023-06-17 NOTE — Patient Instructions (Signed)
Keep vacation glow going Randel Pigg or Margot Ables See me in 2 months

## 2023-06-17 NOTE — Assessment & Plan Note (Signed)
Chronic problem with mild exacerbation.  Has had some stress recently.  Discussed icing regimen and home exercises, which activities to do and which ones to avoid.  No change in medications we will refill appropriately.  Will follow-up with me again in 6 to 8 weeks otherwise

## 2023-07-03 DIAGNOSIS — H52222 Regular astigmatism, left eye: Secondary | ICD-10-CM | POA: Diagnosis not present

## 2023-07-03 DIAGNOSIS — H524 Presbyopia: Secondary | ICD-10-CM | POA: Diagnosis not present

## 2023-08-04 ENCOUNTER — Other Ambulatory Visit (HOSPITAL_COMMUNITY): Payer: Self-pay

## 2023-08-07 DIAGNOSIS — H33322 Round hole, left eye: Secondary | ICD-10-CM | POA: Diagnosis not present

## 2023-08-11 DIAGNOSIS — H33322 Round hole, left eye: Secondary | ICD-10-CM | POA: Diagnosis not present

## 2023-08-18 NOTE — Progress Notes (Unsigned)
[name] [address] [place] [address] Phone: [phone] Subjective:   Bruce Donath, am serving as a scribe for Dr. Antoine Primas.  I'm seeing this patient by the request  of:  McElwee, Lauren A, NP  CC: Back and neck pain follow-up  [id number]  Shelly [name] is a 62 y.o. female coming in with complaint of back and neck pain. OMT 06/17/2023. Patient states that she is doing well. Has been doing some yardwork and has some discomfort but minimal.  Has been doing some yard work and does feel that this is contributing to some increasing in tightness.  Medications patient has been prescribed: None  Taking:         Reviewed prior external information including notes and imaging from previsou exam, outside providers and external EMR if available.   As well as notes that were available from care everywhere and other healthcare systems.  Past medical history, social, surgical and family history all reviewed in electronic medical record.  No pertanent information unless stated regarding to the chief complaint.   Past Medical History:  Diagnosis Date   Depression    Endometrial mass    Endometriosis    Fibroid, uterine    GERD (gastroesophageal reflux disease)    Heart murmur    History of adenomatous polyp of colon 2015   History of kidney stones    History of migraine    Hypothyroidism    followed by pcp   Insomnia 08/28/2016   Osteoarthritis 08/28/2016   feet, hands, knees   PMB (postmenopausal bleeding)    Sicca syndrome with keratoconjunctivitis (HCC) 08/28/2016   Somatic dysfunction    cervical , lumbar, sacral , rib cage region   Vitamin D deficiency 08/28/2016    Allergies  Allergen Reactions   Compazine [Prochlorperazine Edisylate] Other (See Comments)    Muscle contractions     Review of Systems:  No headache, visual changes, nausea, vomiting, diarrhea, constipation, dizziness, abdominal pain, skin rash,  fevers, chills, night sweats, weight loss, swollen lymph nodes, body aches, joint swelling, chest pain, shortness of breath, mood changes. POSITIVE muscle aches  Objective  Blood pressure 120/84, pulse (!) 55, height 5\' 6"  (1.676 m), weight 174 lb (78.9 kg), last menstrual period 02/22/2014, SpO2 95%.   General: No apparent distress alert and oriented x3 mood and affect normal, dressed appropriately.  HEENT: Pupils equal, extraocular movements intact  Respiratory: Patient's speak in full sentences and does not appear short of breath  Cardiovascular: No lower extremity edema, non tender, no erythema  Gait MSK:  Back on the low back does have some loss lordosis noted.  Some tenderness to palpation in the paraspinal musculature.  Osteopathic findings  C7 flexed rotated and side bent right T3 extended rotated and side bent right inhaled rib L1 flexed rotated and side bent right L3 flexed rotated and side bent left Sacrum right on right       Assessment and Plan:  Scapular dyskinesis Continues to have some scapular dyskinesis as well as some tightness of the lower back.  Will continue to monitor.  Discussed icing regimen and home exercises patient is continuing to treat and take care of her aging father.  Having difficulty with congestive heart failure.  Not being able to do the exercises on a regular basis.  Follow-up again in 6 to 8 weeks    Nonallopathic problems  Decision today to treat with OMT was based on Physical Exam  After verbal consent patient was treated with HVLA, ME, FPR techniques in cervical, rib, thoracic, lumbar, and sacral  areas  Patient tolerated the procedure well with improvement in symptoms  Patient given exercises, stretches and lifestyle modifications  See medications in patient instructions if given  Patient will follow up in 4-8 weeks     The above documentation has been reviewed and is accurate and complete Judi Saa, DO          Note: This dictation was prepared with Dragon dictation along with smaller phrase technology. Any transcriptional errors that result from this process are unintentional.

## 2023-08-19 ENCOUNTER — Ambulatory Visit (INDEPENDENT_AMBULATORY_CARE_PROVIDER_SITE_OTHER): Payer: 59 | Admitting: Family Medicine

## 2023-08-19 ENCOUNTER — Encounter: Payer: Self-pay | Admitting: Family Medicine

## 2023-08-19 ENCOUNTER — Other Ambulatory Visit (HOSPITAL_COMMUNITY): Payer: Self-pay

## 2023-08-19 VITALS — BP 120/84 | HR 55 | Ht 66.0 in | Wt 174.0 lb

## 2023-08-19 DIAGNOSIS — M9908 Segmental and somatic dysfunction of rib cage: Secondary | ICD-10-CM | POA: Diagnosis not present

## 2023-08-19 DIAGNOSIS — G2589 Other specified extrapyramidal and movement disorders: Secondary | ICD-10-CM

## 2023-08-19 DIAGNOSIS — M9901 Segmental and somatic dysfunction of cervical region: Secondary | ICD-10-CM | POA: Diagnosis not present

## 2023-08-19 DIAGNOSIS — M9902 Segmental and somatic dysfunction of thoracic region: Secondary | ICD-10-CM

## 2023-08-19 DIAGNOSIS — M9903 Segmental and somatic dysfunction of lumbar region: Secondary | ICD-10-CM

## 2023-08-19 DIAGNOSIS — M9904 Segmental and somatic dysfunction of sacral region: Secondary | ICD-10-CM | POA: Diagnosis not present

## 2023-08-19 MED ORDER — INFLUENZA VIRUS VACC SPLIT PF (FLUZONE) 0.5 ML IM SUSY
0.5000 mL | PREFILLED_SYRINGE | Freq: Once | INTRAMUSCULAR | 0 refills | Status: AC
  Filled 2023-08-19: qty 0.5, 1d supply, fill #0

## 2023-08-19 NOTE — Assessment & Plan Note (Signed)
Continues to have some scapular dyskinesis as well as some tightness of the lower back.  Will continue to monitor.  Discussed icing regimen and home exercises patient is continuing to treat and take care of her aging father.  Having difficulty with congestive heart failure.  Not being able to do the exercises on a regular basis.  Follow-up again in 6 to 8 weeks

## 2023-08-19 NOTE — Patient Instructions (Signed)
Good to see you See me again in 6-8 weeks 

## 2023-09-09 ENCOUNTER — Other Ambulatory Visit (HOSPITAL_COMMUNITY): Payer: Self-pay

## 2023-10-08 DIAGNOSIS — H31092 Other chorioretinal scars, left eye: Secondary | ICD-10-CM | POA: Diagnosis not present

## 2023-10-13 NOTE — Progress Notes (Signed)
[name] [address] [place] [address] Phone: [phone] Subjective:   INadine Counts, am serving as a scribe for Dr. Antoine Primas.  I'm seeing this patient by the request  of:  McElwee, Lauren A, NP  CC: Chronic neck and back pain follow-up  [id number]  Shelly [name] is a 62 y.o. female coming in with complaint of back and neck pain. OMT 08/19/2023. Patient states same per usual. No new concerns.  Medications patient has been prescribed: None  Taking:         Reviewed prior external information including notes and imaging from previsou exam, outside providers and external EMR if available.   As well as notes that were available from care everywhere and other healthcare systems.  Past medical history, social, surgical and family history all reviewed in electronic medical record.  No pertanent information unless stated regarding to the chief complaint.   Past Medical History:  Diagnosis Date   Depression    Endometrial mass    Endometriosis    Fibroid, uterine    GERD (gastroesophageal reflux disease)    Heart murmur    History of adenomatous polyp of colon 2015   History of kidney stones    History of migraine    Hypothyroidism    followed by pcp   Insomnia 08/28/2016   Osteoarthritis 08/28/2016   feet, hands, knees   PMB (postmenopausal bleeding)    Sicca syndrome with keratoconjunctivitis (HCC) 08/28/2016   Somatic dysfunction    cervical , lumbar, sacral , rib cage region   Vitamin D deficiency 08/28/2016    Allergies  Allergen Reactions   Compazine [Prochlorperazine Edisylate] Other (See Comments)    Muscle contractions     Review of Systems:  No headache, visual changes, nausea, vomiting, diarrhea, constipation, dizziness, abdominal pain, skin rash, fevers, chills, night sweats, weight loss, swollen lymph nodes, body aches, joint swelling, chest pain, shortness of breath, mood changes. POSITIVE muscle  aches  Objective  Blood pressure 124/84, pulse 73, height 5\' 6"  (1.676 m), weight 176 lb (79.8 kg), last menstrual period 02/22/2014, SpO2 99%.   General: No apparent distress alert and oriented x3 mood and affect normal, dressed appropriately.  HEENT: Pupils equal, extraocular movements intact  Respiratory: Patient's speak in full sentences and does not appear short of breath  Cardiovascular: No lower extremity edema, non tender, no erythema  Neck exam does seem to be some stiffer than what was previously seen.  Tightness with sidebending bilaterally.  Tightness in the parascapular area.  Osteopathic findings  C2 flexed rotated and side bent right C3 flexed rotated and side bent right T3 extended rotated and side bent right inhaled rib        Assessment and Plan:  Chronic neck pain Chronic neck pain that seems to be exacerbated secondary to patient's underlying increase in stress.  Recently did have loss of family but seems to have good support system.  Patient will continue to make strides.  Discussed icing regimen and home exercises otherwise.  Follow-up with me again in 6 to 8 weeks.    Nonallopathic problems  Decision today to treat with OMT was based on Physical Exam  After verbal consent patient was treated with HVLA, ME, FPR techniques in cervical, rib, thoracic areas  Patient tolerated the procedure well with improvement in symptoms  Patient given exercises, stretches and lifestyle modifications  See medications in patient instructions if given  Patient will follow up in 4-8  weeks     The above documentation has been reviewed and is accurate and complete Judi Saa, DO         Note: This dictation was prepared with Dragon dictation along with smaller phrase technology. Any transcriptional errors that result from this process are unintentional.

## 2023-10-14 ENCOUNTER — Encounter: Payer: Self-pay | Admitting: Family Medicine

## 2023-10-14 ENCOUNTER — Ambulatory Visit: Payer: 59 | Admitting: Family Medicine

## 2023-10-14 VITALS — BP 124/84 | HR 73 | Ht 66.0 in | Wt 176.0 lb

## 2023-10-14 DIAGNOSIS — M9903 Segmental and somatic dysfunction of lumbar region: Secondary | ICD-10-CM | POA: Diagnosis not present

## 2023-10-14 DIAGNOSIS — M9908 Segmental and somatic dysfunction of rib cage: Secondary | ICD-10-CM | POA: Diagnosis not present

## 2023-10-14 DIAGNOSIS — M9902 Segmental and somatic dysfunction of thoracic region: Secondary | ICD-10-CM | POA: Diagnosis not present

## 2023-10-14 DIAGNOSIS — M9901 Segmental and somatic dysfunction of cervical region: Secondary | ICD-10-CM | POA: Diagnosis not present

## 2023-10-14 DIAGNOSIS — G8929 Other chronic pain: Secondary | ICD-10-CM | POA: Diagnosis not present

## 2023-10-14 DIAGNOSIS — M542 Cervicalgia: Secondary | ICD-10-CM | POA: Diagnosis not present

## 2023-10-14 DIAGNOSIS — M9904 Segmental and somatic dysfunction of sacral region: Secondary | ICD-10-CM | POA: Diagnosis not present

## 2023-10-14 NOTE — Assessment & Plan Note (Signed)
Chronic neck pain that seems to be exacerbated secondary to patient's underlying increase in stress.  Recently did have loss of family but seems to have good support system.  Patient will continue to make strides.  Discussed icing regimen and home exercises otherwise.  Follow-up with me again in 6 to 8 weeks.

## 2023-10-14 NOTE — Patient Instructions (Signed)
Good to see you I know this Christmas is going to be different See me in 6-8 weeks

## 2023-10-24 ENCOUNTER — Other Ambulatory Visit: Payer: 59

## 2023-11-25 NOTE — Progress Notes (Unsigned)
[name] [address] [place] [address] Phone: [phone] Subjective:   Shelly [name] am a scribe for Dr. Katrinka Blazing.  I'm seeing this patient by the request  of:  McElwee, Lauren A, NP  CC: back and neck pain follow up   [id number]  Shelly [name] is a 63 y.o. female coming in with complaint of back and neck pain. OMT 10/14/2023. Patient states has a little crick in the neck today right above scapula. She pointed to the center of her upper back and neck. Patient stated that she did not wake up with this sensation this morning.   Medications patient has been prescribed: None  Taking:       Reviewed prior external information including notes and imaging from previsou exam, outside providers and external EMR if available.   As well as notes that were available from care everywhere and other healthcare systems.  Past medical history, social, surgical and family history all reviewed in electronic medical record.  No pertanent information unless stated regarding to the chief complaint.   Past Medical History:  Diagnosis Date   Depression    Endometrial mass    Endometriosis    Fibroid, uterine    GERD (gastroesophageal reflux disease)    Heart murmur    History of adenomatous polyp of colon 2015   History of kidney stones    History of migraine    Hypothyroidism    followed by pcp   Insomnia 08/28/2016   Osteoarthritis 08/28/2016   feet, hands, knees   PMB (postmenopausal bleeding)    Sicca syndrome with keratoconjunctivitis (HCC) 08/28/2016   Somatic dysfunction    cervical , lumbar, sacral , rib cage region   Vitamin D deficiency 08/28/2016    Allergies  Allergen Reactions   Compazine [Prochlorperazine Edisylate] Other (See Comments)    Muscle contractions     Review of Systems:  No  visual changes, nausea, vomiting, diarrhea, constipation, dizziness, abdominal pain, skin rash, fevers, chills, night sweats, weight  loss, swollen lymph nodes,  joint swelling, chest pain, shortness of breath, mood changes. POSITIVE muscle aches, body aches, headache  Objective  Blood pressure (!) 144/80, pulse 89, height 5\' 6"  (1.676 m), weight 176 lb (79.8 kg), last menstrual period 02/22/2014, SpO2 97%.   General: No apparent distress alert and oriented x3 mood and affect normal, dressed appropriately.  HEENT: Pupils equal, extraocular movements intact  Respiratory: Patient's speak in full sentences and does not appear short of breath  Cardiovascular: No lower extremity edema, non tender, no erythema  MSK:  Back does have some loss lordosis noted.  Some tenderness to palpation of the paraspinal musculature.  Tightness with Pearlean Brownie right greater than left.  Osteopathic findings  C3 flexed rotated and side bent right C7 flexed rotated and side bent right T3 extended rotated and side bent right inhaled rib T9 extended rotated and side bent left L2 flexed rotated and side bent right L4 flexed rotated and side bent left Sacrum right on right    Assessment and Plan:  Scapular dyskinesis Continue to have some scapular dyskinesis.  Discussed icing regimen and home exercises, which activities to do and which ones to avoid.  Patient has had some increasing stress again..  Follow-up again in 6 to 8 weeks  Vitamin D deficiency Continue supplementation.  Is due for bone density.    Nonallopathic problems  Decision today to treat with OMT was based on Physical Exam  After verbal  consent patient was treated with HVLA, ME, FPR techniques in cervical, rib, thoracic, lumbar, and sacral  areas  Patient tolerated the procedure well with improvement in symptoms  Patient given exercises, stretches and lifestyle modifications  See medications in patient instructions if given  Patient will follow up in 4-8 weeks    The above documentation has been reviewed and is accurate and complete Judi Saa, DO           Note: This dictation was prepared with Dragon dictation along with smaller phrase technology. Any transcriptional errors that result from this process are unintentional.

## 2023-11-26 ENCOUNTER — Encounter: Payer: Self-pay | Admitting: Family Medicine

## 2023-11-26 ENCOUNTER — Ambulatory Visit (INDEPENDENT_AMBULATORY_CARE_PROVIDER_SITE_OTHER): Payer: 59 | Admitting: Family Medicine

## 2023-11-26 VITALS — BP 144/80 | HR 89 | Ht 66.0 in | Wt 176.0 lb

## 2023-11-26 DIAGNOSIS — M859 Disorder of bone density and structure, unspecified: Secondary | ICD-10-CM

## 2023-11-26 DIAGNOSIS — M9903 Segmental and somatic dysfunction of lumbar region: Secondary | ICD-10-CM | POA: Diagnosis not present

## 2023-11-26 DIAGNOSIS — M9902 Segmental and somatic dysfunction of thoracic region: Secondary | ICD-10-CM | POA: Diagnosis not present

## 2023-11-26 DIAGNOSIS — E559 Vitamin D deficiency, unspecified: Secondary | ICD-10-CM

## 2023-11-26 DIAGNOSIS — M9904 Segmental and somatic dysfunction of sacral region: Secondary | ICD-10-CM

## 2023-11-26 DIAGNOSIS — M9901 Segmental and somatic dysfunction of cervical region: Secondary | ICD-10-CM

## 2023-11-26 DIAGNOSIS — G2589 Other specified extrapyramidal and movement disorders: Secondary | ICD-10-CM

## 2023-11-26 DIAGNOSIS — M9908 Segmental and somatic dysfunction of rib cage: Secondary | ICD-10-CM | POA: Diagnosis not present

## 2023-11-26 NOTE — Patient Instructions (Addendum)
Schedule Bone Density. Return in 6 to 8 weeks.

## 2023-11-26 NOTE — Assessment & Plan Note (Signed)
Continue supplementation.  Is due for bone density.

## 2023-11-26 NOTE — Assessment & Plan Note (Signed)
Continue to have some scapular dyskinesis.  Discussed icing regimen and home exercises, which activities to do and which ones to avoid.  Patient has had some increasing stress again..  Follow-up again in 6 to 8 weeks

## 2023-12-02 ENCOUNTER — Encounter: Payer: Self-pay | Admitting: Family Medicine

## 2023-12-02 ENCOUNTER — Ambulatory Visit (INDEPENDENT_AMBULATORY_CARE_PROVIDER_SITE_OTHER)
Admission: RE | Admit: 2023-12-02 | Discharge: 2023-12-02 | Disposition: A | Discharge status: Home / Self Care | Payer: 59 | Source: Ambulatory Visit | Attending: Family Medicine | Admitting: Family Medicine

## 2023-12-02 DIAGNOSIS — M859 Disorder of bone density and structure, unspecified: Secondary | ICD-10-CM

## 2024-01-12 ENCOUNTER — Other Ambulatory Visit (HOSPITAL_COMMUNITY): Payer: Self-pay

## 2024-01-12 MED ORDER — DEXAMETHASONE 4 MG PO TABS
4.0000 mg | ORAL_TABLET | Freq: Once | ORAL | 0 refills | Status: AC
  Filled 2024-01-12: qty 1, 1d supply, fill #0

## 2024-01-12 MED ORDER — AMOXICILLIN 500 MG PO CAPS
2000.0000 mg | ORAL_CAPSULE | Freq: Once | ORAL | 0 refills | Status: AC
  Filled 2024-01-12: qty 4, 1d supply, fill #0

## 2024-01-13 NOTE — Progress Notes (Unsigned)
 [name] [address] [place] [address] Phone: [phone] Subjective:   Bruce Donath, am serving as a scribe for Dr. Antoine Primas.  I'm seeing this patient by the request  of:  McElwee, Lauren A, NP  CC: Back and neck pain follow-up  [id number]  Shelly [name] is a 63 y.o. female coming in with complaint of back and neck pain. OMT on 11/26/2023. Patient states that she has been doing ok since last visit.  Stopping her from regular daily activities.  Has been taking care of her grandchildren who has been sick recently.  Medications patient has been prescribed:   Taking:         Reviewed prior external information including notes and imaging from previsou exam, outside providers and external EMR if available.   As well as notes that were available from care everywhere and other healthcare systems.  Past medical history, social, surgical and family history all reviewed in electronic medical record.  No pertanent information unless stated regarding to the chief complaint.   Past Medical History:  Diagnosis Date   Depression    Endometrial mass    Endometriosis    Fibroid, uterine    GERD (gastroesophageal reflux disease)    Heart murmur    History of adenomatous polyp of colon 2015   History of kidney stones    History of migraine    Hypothyroidism    followed by pcp   Insomnia 08/28/2016   Osteoarthritis 08/28/2016   feet, hands, knees   PMB (postmenopausal bleeding)    Sicca syndrome with keratoconjunctivitis (HCC) 08/28/2016   Somatic dysfunction    cervical , lumbar, sacral , rib cage region   Vitamin D deficiency 08/28/2016    Allergies  Allergen Reactions   Compazine [Prochlorperazine Edisylate] Other (See Comments)    Muscle contractions     Review of Systems:  No headache, visual changes, nausea, vomiting, diarrhea, constipation, dizziness, abdominal pain, skin rash, fevers, chills, night sweats, weight  loss, swollen lymph nodes, body aches, joint swelling, chest pain, shortness of breath, mood changes. POSITIVE muscle aches  Objective  Blood pressure 130/78, height 5\' 6"  (1.676 m), last menstrual period 02/22/2014.   General: No apparent distress alert and oriented x3 mood and affect normal, dressed appropriately.  HEENT: Pupils equal, extraocular movements intact  Respiratory: Patient's speak in full sentences and does not appear short of breath  Cardiovascular: No lower extremity edema, non tender, no erythema  Gait MSK:  Back does have some mild loss lordosis noted.  Significant tightness still noted in the trapezium.  Tightness of the neck with sidebending bilaterally.  Osteopathic findings  C4 flexed rotated and side bent right C6 flexed rotated and side bent left T4 extended rotated and side bent right inhaled rib L2 flexed rotated and side bent right Sacrum right on right       Assessment and Plan:  Chronic neck pain Tightness, anxiety, as well as ergonomics.  Discussed with patient about icing regimen and home exercises.  Discussed which activities to do and which ones to avoid.  Increase activity slowly otherwise.  Discussed posture and ergonomics.  Does respond well to osteopathic manipulation.  Follow-up again in 6 to 8 weeks.    Nonallopathic problems  Decision today to treat with OMT was based on Physical Exam  After verbal consent patient was treated with HVLA, ME, FPR techniques in cervical, rib, thoracic, lumbar, and sacral  areas  Patient tolerated the  procedure well with improvement in symptoms  Patient given exercises, stretches and lifestyle modifications  See medications in patient instructions if given  Patient will follow up in 4-8 weeks     The above documentation has been reviewed and is accurate and complete Judi Saa, DO         Note: This dictation was prepared with Dragon dictation along with smaller phrase technology. Any  transcriptional errors that result from this process are unintentional.

## 2024-01-14 ENCOUNTER — Ambulatory Visit (INDEPENDENT_AMBULATORY_CARE_PROVIDER_SITE_OTHER): Payer: 59 | Admitting: Family Medicine

## 2024-01-14 ENCOUNTER — Encounter: Payer: Self-pay | Admitting: Family Medicine

## 2024-01-14 VITALS — BP 130/78 | Ht 66.0 in

## 2024-01-14 DIAGNOSIS — M9902 Segmental and somatic dysfunction of thoracic region: Secondary | ICD-10-CM

## 2024-01-14 DIAGNOSIS — M9901 Segmental and somatic dysfunction of cervical region: Secondary | ICD-10-CM | POA: Diagnosis not present

## 2024-01-14 DIAGNOSIS — M9908 Segmental and somatic dysfunction of rib cage: Secondary | ICD-10-CM | POA: Diagnosis not present

## 2024-01-14 DIAGNOSIS — G8929 Other chronic pain: Secondary | ICD-10-CM

## 2024-01-14 DIAGNOSIS — M9903 Segmental and somatic dysfunction of lumbar region: Secondary | ICD-10-CM | POA: Diagnosis not present

## 2024-01-14 DIAGNOSIS — M542 Cervicalgia: Secondary | ICD-10-CM | POA: Diagnosis not present

## 2024-01-14 DIAGNOSIS — M9904 Segmental and somatic dysfunction of sacral region: Secondary | ICD-10-CM | POA: Diagnosis not present

## 2024-01-14 NOTE — Patient Instructions (Signed)
See me in 6-7 weeks 

## 2024-01-14 NOTE — Assessment & Plan Note (Signed)
 Tightness, anxiety, as well as ergonomics.  Discussed with patient about icing regimen and home exercises.  Discussed which activities to do and which ones to avoid.  Increase activity slowly otherwise.  Discussed posture and ergonomics.  Does respond well to osteopathic manipulation.  Follow-up again in 6 to 8 weeks.

## 2024-02-11 DIAGNOSIS — H31092 Other chorioretinal scars, left eye: Secondary | ICD-10-CM | POA: Diagnosis not present

## 2024-03-02 ENCOUNTER — Ambulatory Visit: Admitting: Family Medicine

## 2024-03-02 ENCOUNTER — Other Ambulatory Visit (HOSPITAL_COMMUNITY): Payer: Self-pay

## 2024-03-02 ENCOUNTER — Encounter: Payer: Self-pay | Admitting: Family Medicine

## 2024-03-02 VITALS — BP 118/82 | HR 67 | Ht 66.0 in | Wt 176.0 lb

## 2024-03-02 DIAGNOSIS — M9904 Segmental and somatic dysfunction of sacral region: Secondary | ICD-10-CM

## 2024-03-02 DIAGNOSIS — M9908 Segmental and somatic dysfunction of rib cage: Secondary | ICD-10-CM

## 2024-03-02 DIAGNOSIS — M542 Cervicalgia: Secondary | ICD-10-CM | POA: Diagnosis not present

## 2024-03-02 DIAGNOSIS — M9902 Segmental and somatic dysfunction of thoracic region: Secondary | ICD-10-CM

## 2024-03-02 DIAGNOSIS — E038 Other specified hypothyroidism: Secondary | ICD-10-CM

## 2024-03-02 DIAGNOSIS — M9901 Segmental and somatic dysfunction of cervical region: Secondary | ICD-10-CM | POA: Diagnosis not present

## 2024-03-02 DIAGNOSIS — M9903 Segmental and somatic dysfunction of lumbar region: Secondary | ICD-10-CM

## 2024-03-02 DIAGNOSIS — G8929 Other chronic pain: Secondary | ICD-10-CM | POA: Diagnosis not present

## 2024-03-02 MED ORDER — LEVOTHYROXINE SODIUM 88 MCG PO TABS
ORAL_TABLET | ORAL | 3 refills | Status: DC
  Filled 2024-03-02: qty 90, 90d supply, fill #0
  Filled 2024-06-14: qty 90, 90d supply, fill #1

## 2024-03-02 NOTE — Patient Instructions (Addendum)
 Synthroid  refilled Try the infrared therapy 680 nanometers See you again in 6-8 weeks

## 2024-03-02 NOTE — Assessment & Plan Note (Signed)
 Chronic neck pain.  Discussed icing regimen and home exercises, discussed which activities to do and which ones to avoid.  Increase activity slowly.  Discussed icing regimen and home exercises.  Increase activity slowly.  Follow-up again in 6 to 8 weeks.

## 2024-03-02 NOTE — Progress Notes (Signed)
 [facility] [address] [place] [address] Phone: [phone] Subjective:   Shelly [name], am serving as a scribe for Dr. Ronnell Coins.  I'm seeing this patient by the request  of:  McElwee, Lauren A, NP  CC: Neck pain and back pain follow-up  [id number]  Shelly [name] is a 63 y.o. female coming in with complaint of back and neck pain. OMT on 01/14/2024. Patient states that 2 weeks ago her elbows flared up. Hx of cubital tunnel syndrome. Pain has improved and is localized to back of each elbow. Burning in character.   Back is doing well.           Reviewed prior external information including notes and imaging from previsou exam, outside providers and external EMR if available.   As well as notes that were available from care everywhere and other healthcare systems.  Past medical history, social, surgical and family history all reviewed in electronic medical record.  No pertanent information unless stated regarding to the chief complaint.   Past Medical History:  Diagnosis Date   Depression    Endometrial mass    Endometriosis    Fibroid, uterine    GERD (gastroesophageal reflux disease)    Heart murmur    History of adenomatous polyp of colon 2015   History of kidney stones    History of migraine    Hypothyroidism    followed by pcp   Insomnia 08/28/2016   Osteoarthritis 08/28/2016   feet, hands, knees   PMB (postmenopausal bleeding)    Sicca syndrome with keratoconjunctivitis (HCC) 08/28/2016   Somatic dysfunction    cervical , lumbar, sacral , rib cage region   Vitamin D  deficiency 08/28/2016    Allergies  Allergen Reactions   Compazine [Prochlorperazine Edisylate] Other (See Comments)    Muscle contractions     Review of Systems:  No headache, visual changes, nausea, vomiting, diarrhea, constipation, dizziness, abdominal pain, skin rash, fevers, chills, night sweats, weight loss, swollen lymph nodes, body  aches, joint swelling, chest pain, shortness of breath, mood changes. POSITIVE muscle aches  Objective  Blood pressure 118/82, pulse 67, height 5\' 6"  (1.676 m), weight 176 lb (79.8 kg), last menstrual period 02/22/2014, SpO2 99%.   General: No apparent distress alert and oriented x3 mood and affect normal, dressed appropriately.  HEENT: Pupils equal, extraocular movements intact  Respiratory: Patient's speak in full sentences and does not appear short of breath  Cardiovascular: No lower extremity edema, non tender, no erythema  Neck does have some loss lordosis noted.  Tightness noted.  Limited sidebending bilaterally.  Negative Spurling's noted.  Osteopathic findings  C2 flexed rotated and side bent right C6 flexed rotated and side bent left C7 flexed rotated and side bent right T3 extended rotated and side bent right inhaled rib L4 flexed rotated and side bent right Sacrum right on right       Assessment and Plan:  Chronic neck pain Chronic neck pain.  Discussed icing regimen and home exercises, discussed which activities to do and which ones to avoid.  Increase activity slowly.  Discussed icing regimen and home exercises.  Increase activity slowly.  Follow-up again in 6 to 8 weeks.    Nonallopathic problems  Decision today to treat with OMT was based on Physical Exam  After verbal consent patient was treated with HVLA, ME, FPR techniques in cervical, rib, thoracic, lumbar, and sacral  areas  Patient tolerated the procedure well with improvement  in symptoms  Patient given exercises, stretches and lifestyle modifications  See medications in patient instructions if given  Patient will follow up in 4-8 weeks     The above documentation has been reviewed and is accurate and complete Shelly Bia M Ryszard Socarras, DO         Note: This dictation was prepared with Dragon dictation along with smaller phrase technology. Any transcriptional errors that result from this process are  unintentional.

## 2024-03-02 NOTE — Assessment & Plan Note (Signed)
 Refilled medications while she is awaiting primary care follow-up

## 2024-03-05 ENCOUNTER — Other Ambulatory Visit (HOSPITAL_COMMUNITY): Payer: Self-pay

## 2024-04-16 NOTE — Progress Notes (Unsigned)
  [facility] [address] [place] [address] Phone: [phone] Subjective:    I'm seeing this patient by the request  of:  McElwee, Lauren A, NP  CC:   [id number]  Shelly [name] is a 63 y.o. female coming in with complaint of back and neck pain. OMT on 03/02/2024. Patient states   Medications patient has been prescribed: Synthroid   Taking:         Reviewed prior external information including notes and imaging from previsou exam, outside providers and external EMR if available.   As well as notes that were available from care everywhere and other healthcare systems.  Past medical history, social, surgical and family history all reviewed in electronic medical record.  No pertanent information unless stated regarding to the chief complaint.   Past Medical History:  Diagnosis Date   Depression    Endometrial mass    Endometriosis    Fibroid, uterine    GERD (gastroesophageal reflux disease)    Heart murmur    History of adenomatous polyp of colon 2015   History of kidney stones    History of migraine    Hypothyroidism    followed by pcp   Insomnia 08/28/2016   Osteoarthritis 08/28/2016   feet, hands, knees   PMB (postmenopausal bleeding)    Sicca syndrome with keratoconjunctivitis 08/28/2016   Somatic dysfunction    cervical , lumbar, sacral , rib cage region   Vitamin D  deficiency 08/28/2016    Allergies  Allergen Reactions   Compazine [Prochlorperazine Edisylate] Other (See Comments)    Muscle contractions     Review of Systems:  No headache, visual changes, nausea, vomiting, diarrhea, constipation, dizziness, abdominal pain, skin rash, fevers, chills, night sweats, weight loss, swollen lymph nodes, body aches, joint swelling, chest pain, shortness of breath, mood changes. POSITIVE muscle aches  Objective  Last menstrual period 02/22/2014.   General: No apparent distress alert and oriented x3 mood and affect  normal, dressed appropriately.  HEENT: Pupils equal, extraocular movements intact  Respiratory: Patient's speak in full sentences and does not appear short of breath  Cardiovascular: No lower extremity edema, non tender, no erythema  Gait MSK:  Back   Osteopathic findings  C2 flexed rotated and side bent right C6 flexed rotated and side bent left T3 extended rotated and side bent right inhaled rib T9 extended rotated and side bent left L2 flexed rotated and side bent right Sacrum right on right       Assessment and Plan:  No problem-specific Assessment & Plan notes found for this encounter.    Nonallopathic problems  Decision today to treat with OMT was based on Physical Exam  After verbal consent patient was treated with HVLA, ME, FPR techniques in cervical, rib, thoracic, lumbar, and sacral  areas  Patient tolerated the procedure well with improvement in symptoms  Patient given exercises, stretches and lifestyle modifications  See medications in patient instructions if given  Patient will follow up in 4-8 weeks             Note: This dictation was prepared with Dragon dictation along with smaller phrase technology. Any transcriptional errors that result from this process are unintentional.

## 2024-04-21 ENCOUNTER — Other Ambulatory Visit (HOSPITAL_COMMUNITY): Payer: Self-pay

## 2024-04-21 ENCOUNTER — Encounter: Payer: Self-pay | Admitting: Family Medicine

## 2024-04-21 ENCOUNTER — Ambulatory Visit (INDEPENDENT_AMBULATORY_CARE_PROVIDER_SITE_OTHER): Admitting: Family Medicine

## 2024-04-21 VITALS — HR 90 | Ht 66.0 in | Wt 175.0 lb

## 2024-04-21 DIAGNOSIS — M9902 Segmental and somatic dysfunction of thoracic region: Secondary | ICD-10-CM

## 2024-04-21 DIAGNOSIS — M9903 Segmental and somatic dysfunction of lumbar region: Secondary | ICD-10-CM | POA: Diagnosis not present

## 2024-04-21 DIAGNOSIS — G8929 Other chronic pain: Secondary | ICD-10-CM | POA: Diagnosis not present

## 2024-04-21 DIAGNOSIS — M545 Low back pain, unspecified: Secondary | ICD-10-CM | POA: Diagnosis not present

## 2024-04-21 DIAGNOSIS — M9908 Segmental and somatic dysfunction of rib cage: Secondary | ICD-10-CM

## 2024-04-21 DIAGNOSIS — M9901 Segmental and somatic dysfunction of cervical region: Secondary | ICD-10-CM

## 2024-04-21 DIAGNOSIS — M9904 Segmental and somatic dysfunction of sacral region: Secondary | ICD-10-CM

## 2024-04-21 MED ORDER — FAMOTIDINE 40 MG PO TABS
40.0000 mg | ORAL_TABLET | Freq: Every morning | ORAL | 3 refills | Status: AC
  Filled 2024-04-21: qty 90, 90d supply, fill #0
  Filled 2024-07-20: qty 90, 90d supply, fill #1
  Filled 2024-10-19: qty 90, 90d supply, fill #2

## 2024-04-21 NOTE — Assessment & Plan Note (Signed)
 Increasing low back pain.  New problem.  Responded extremely well though to home exercises so long ago.  Patient given some home exercises, discussed core strengthening.  Attempted osteopathic manipulation for other findings today.  Discussed icing regimen increase activity slowly and follow-up with me again in 6 to 8 weeks.

## 2024-04-21 NOTE — Patient Instructions (Signed)
 Good to see you  Enjoy the pool  Keep strengthening the glutes 8 week follow up

## 2024-05-17 DIAGNOSIS — R5383 Other fatigue: Secondary | ICD-10-CM | POA: Diagnosis not present

## 2024-05-17 DIAGNOSIS — D519 Vitamin B12 deficiency anemia, unspecified: Secondary | ICD-10-CM | POA: Diagnosis not present

## 2024-05-17 DIAGNOSIS — N951 Menopausal and female climacteric states: Secondary | ICD-10-CM | POA: Diagnosis not present

## 2024-05-17 DIAGNOSIS — E559 Vitamin D deficiency, unspecified: Secondary | ICD-10-CM | POA: Diagnosis not present

## 2024-05-25 ENCOUNTER — Other Ambulatory Visit (HOSPITAL_COMMUNITY): Payer: Self-pay

## 2024-05-25 MED ORDER — LIOTHYRONINE SODIUM 5 MCG PO TABS
5.0000 ug | ORAL_TABLET | Freq: Every morning | ORAL | 1 refills | Status: DC
  Filled 2024-05-25: qty 90, 90d supply, fill #0
  Filled 2024-08-19: qty 90, 90d supply, fill #1

## 2024-06-17 NOTE — Progress Notes (Deleted)
  [name] [address] [place] [address] Phone: [phone] Subjective:    I'm seeing this patient by the request  of:  McElwee, Lauren A, NP  CC:   YEP:Dlagzrupcz  Shelly [name] is a 63 y.o. female coming in with complaint of back and neck pain. OMT on 04/21/2024. Patient states   Medications patient has been prescribed: Synthroid   Taking:         Reviewed prior external information including notes and imaging from previsou exam, outside providers and external EMR if available.   As well as notes that were available from care everywhere and other healthcare systems.  Past medical history, social, surgical and family history all reviewed in electronic medical record.  No pertanent information unless stated regarding to the chief complaint.   Past Medical History:  Diagnosis Date   Depression    Endometrial mass    Endometriosis    Fibroid, uterine    GERD (gastroesophageal reflux disease)    Heart murmur    History of adenomatous polyp of colon 2015   History of kidney stones    History of migraine    Hypothyroidism    followed by pcp   Insomnia 08/28/2016   Osteoarthritis 08/28/2016   feet, hands, knees   PMB (postmenopausal bleeding)    Sicca syndrome with keratoconjunctivitis 08/28/2016   Somatic dysfunction    cervical , lumbar, sacral , rib cage region   Vitamin D  deficiency 08/28/2016    Allergies  Allergen Reactions   Compazine [Prochlorperazine Edisylate] Other (See Comments)    Muscle contractions     Review of Systems:  No headache, visual changes, nausea, vomiting, diarrhea, constipation, dizziness, abdominal pain, skin rash, fevers, chills, night sweats, weight loss, swollen lymph nodes, body aches, joint swelling, chest pain, shortness of breath, mood changes. POSITIVE muscle aches  Objective  Last menstrual period 02/22/2014.   General: No apparent distress alert and oriented x3 mood and affect  normal, dressed appropriately.  HEENT: Pupils equal, extraocular movements intact  Respiratory: Patient's speak in full sentences and does not appear short of breath  Cardiovascular: No lower extremity edema, non tender, no erythema  Gait MSK:  Back   Osteopathic findings  C2 flexed rotated and side bent right C6 flexed rotated and side bent left T3 extended rotated and side bent right inhaled rib T9 extended rotated and side bent left L2 flexed rotated and side bent right Sacrum right on right       Assessment and Plan:  No problem-specific Assessment & Plan notes found for this encounter.    Nonallopathic problems  Decision today to treat with OMT was based on Physical Exam  After verbal consent patient was treated with HVLA, ME, FPR techniques in cervical, rib, thoracic, lumbar, and sacral  areas  Patient tolerated the procedure well with improvement in symptoms  Patient given exercises, stretches and lifestyle modifications  See medications in patient instructions if given  Patient will follow up in 4-8 weeks             Note: This dictation was prepared with Dragon dictation along with smaller phrase technology. Any transcriptional errors that result from this process are unintentional.

## 2024-06-21 ENCOUNTER — Encounter: Payer: Self-pay | Admitting: Family Medicine

## 2024-06-21 ENCOUNTER — Ambulatory Visit (INDEPENDENT_AMBULATORY_CARE_PROVIDER_SITE_OTHER): Admitting: Family Medicine

## 2024-06-21 ENCOUNTER — Other Ambulatory Visit (HOSPITAL_COMMUNITY): Payer: Self-pay

## 2024-06-21 VITALS — BP 122/88 | HR 82 | Ht 66.0 in | Wt 174.0 lb

## 2024-06-21 DIAGNOSIS — M545 Low back pain, unspecified: Secondary | ICD-10-CM | POA: Diagnosis not present

## 2024-06-21 DIAGNOSIS — M9904 Segmental and somatic dysfunction of sacral region: Secondary | ICD-10-CM | POA: Diagnosis not present

## 2024-06-21 DIAGNOSIS — M9902 Segmental and somatic dysfunction of thoracic region: Secondary | ICD-10-CM | POA: Diagnosis not present

## 2024-06-21 DIAGNOSIS — M9903 Segmental and somatic dysfunction of lumbar region: Secondary | ICD-10-CM | POA: Diagnosis not present

## 2024-06-21 DIAGNOSIS — M9908 Segmental and somatic dysfunction of rib cage: Secondary | ICD-10-CM

## 2024-06-21 DIAGNOSIS — G8929 Other chronic pain: Secondary | ICD-10-CM

## 2024-06-21 DIAGNOSIS — M9901 Segmental and somatic dysfunction of cervical region: Secondary | ICD-10-CM | POA: Diagnosis not present

## 2024-06-21 MED ORDER — BACLOFEN 10 MG PO TABS
10.0000 mg | ORAL_TABLET | Freq: Three times a day (TID) | ORAL | 0 refills | Status: DC
  Filled 2024-06-21: qty 30, 10d supply, fill #0

## 2024-06-21 NOTE — Progress Notes (Signed)
 [name] [address] [place] [address] Phone: [phone] Subjective:   Shelly [name], am serving as a scribe for Dr. Arthea Claudene.  I'm seeing this patient by the request  of:  McElwee, Lauren A, NP  CC: Back and neck pain follow-up  YEP:Dlagzrupcz  Shelly [name] is a 63 y.o. female coming in with complaint of back and neck pain. OMT 04/21/2024.  Tightness noted more in the shoulder blades.  Patient states has some lower back pain as well.  Nothing severe that stopping from home exercises.  Medications patient has been prescribed: None  Taking:         Reviewed prior external information including notes and imaging from previsou exam, outside providers and external EMR if available.   As well as notes that were available from care everywhere and other healthcare systems.  Past medical history, social, surgical and family history all reviewed in electronic medical record.  No pertanent information unless stated regarding to the chief complaint.   Past Medical History:  Diagnosis Date   Depression    Endometrial mass    Endometriosis    Fibroid, uterine    GERD (gastroesophageal reflux disease)    Heart murmur    History of adenomatous polyp of colon 2015   History of kidney stones    History of migraine    Hypothyroidism    followed by pcp   Insomnia 08/28/2016   Osteoarthritis 08/28/2016   feet, hands, knees   PMB (postmenopausal bleeding)    Sicca syndrome with keratoconjunctivitis 08/28/2016   Somatic dysfunction    cervical , lumbar, sacral , rib cage region   Vitamin D  deficiency 08/28/2016    Allergies  Allergen Reactions   Compazine [Prochlorperazine Edisylate] Other (See Comments)    Muscle contractions     Review of Systems:  No headache, visual changes, nausea, vomiting, diarrhea, constipation, dizziness, abdominal pain, skin rash, fevers, chills, night sweats, weight loss, swollen lymph nodes, body  aches, joint swelling, chest pain, shortness of breath, mood changes. POSITIVE muscle aches  Objective  Blood pressure 122/88, pulse 82, height 5' 6 (1.676 m), weight 174 lb (78.9 kg), last menstrual period 02/22/2014, SpO2 98%.   General: No apparent distress alert and oriented x3 mood and affect normal, dressed appropriately.  HEENT: Pupils equal, extraocular movements intact  Respiratory: Patient's speak in full sentences and does not appear short of breath  Cardiovascular: No lower extremity edema, non tender, no erythema  Gait relatively normal MSK:  Back he does have some loss of lordosis noted.  Some tightness noted more in the thoracolumbar juncture.  Neck exam does have some mild limited sidebending bilaterally.  Osteopathic findings  C4 flexed rotated and side bent right C5 flexed rotated and side bent left T3 extended rotated and side bent right inhaled rib T4 extended rotated and side bent left L2 flexed rotated and side bent right Sacrum right on right    Assessment and Plan:  Low back pain Knee, the mild exacerbation secondary to cleaning of her parents house.  Has had some difficulty with this.  Little more neck pain as well since having some dental procedures.  Follow-up again in 6 to 8 weeks    Nonallopathic problems  Decision today to treat with OMT was based on Physical Exam  After verbal consent patient was treated with HVLA, ME, FPR techniques in cervical, rib, thoracic, lumbar, and sacral  areas  Patient tolerated the procedure well  with improvement in symptoms  Patient given exercises, stretches and lifestyle modifications  See medications in patient instructions if given  Patient will follow up in 4-8 weeks    The above documentation has been reviewed and is accurate and complete Azaan Leask M Samiyah Stupka, DO          Note: This dictation was prepared with Dragon dictation along with smaller phrase technology. Any transcriptional errors that result  from this process are unintentional.

## 2024-06-21 NOTE — Assessment & Plan Note (Addendum)
 Knee, the mild exacerbation secondary to cleaning of her parents house.  Has had some difficulty with this.  Little more neck pain as well since having some dental procedures.  Patient is describing more tightness at night and getting some baclofen .  10 mg to take at night.  Follow-up again in 6 to 8 weeks

## 2024-06-21 NOTE — Patient Instructions (Addendum)
 Baclofen  refilled Keep Sept appt [facility] dentist gets their act together

## 2024-06-22 ENCOUNTER — Ambulatory Visit: Admitting: Family Medicine

## 2024-06-22 ENCOUNTER — Other Ambulatory Visit (HOSPITAL_COMMUNITY): Payer: Self-pay

## 2024-06-29 DIAGNOSIS — E663 Overweight: Secondary | ICD-10-CM | POA: Diagnosis not present

## 2024-06-29 DIAGNOSIS — E039 Hypothyroidism, unspecified: Secondary | ICD-10-CM | POA: Diagnosis not present

## 2024-06-29 DIAGNOSIS — E782 Mixed hyperlipidemia: Secondary | ICD-10-CM | POA: Diagnosis not present

## 2024-06-29 DIAGNOSIS — R5383 Other fatigue: Secondary | ICD-10-CM | POA: Diagnosis not present

## 2024-07-06 ENCOUNTER — Other Ambulatory Visit (HOSPITAL_COMMUNITY): Payer: Self-pay

## 2024-07-06 MED ORDER — LEVOTHYROXINE SODIUM 100 MCG PO TABS
100.0000 ug | ORAL_TABLET | Freq: Every day | ORAL | 1 refills | Status: AC
  Filled 2024-07-06 – 2024-07-07 (×2): qty 90, 90d supply, fill #0
  Filled 2024-09-26: qty 90, 90d supply, fill #1

## 2024-07-07 ENCOUNTER — Other Ambulatory Visit: Payer: Self-pay

## 2024-07-07 ENCOUNTER — Other Ambulatory Visit (HOSPITAL_COMMUNITY): Payer: Self-pay

## 2024-07-08 ENCOUNTER — Other Ambulatory Visit (HOSPITAL_COMMUNITY): Payer: Self-pay

## 2024-07-20 ENCOUNTER — Other Ambulatory Visit (HOSPITAL_COMMUNITY): Payer: Self-pay

## 2024-07-22 ENCOUNTER — Ambulatory Visit: Admitting: Family Medicine

## 2024-08-03 ENCOUNTER — Other Ambulatory Visit (HOSPITAL_COMMUNITY): Payer: Self-pay

## 2024-08-03 ENCOUNTER — Other Ambulatory Visit: Payer: Self-pay

## 2024-08-03 ENCOUNTER — Encounter: Payer: Self-pay | Admitting: Pharmacist

## 2024-08-03 MED ORDER — SCOPOLAMINE 1 MG/3DAYS TD PT72
MEDICATED_PATCH | TRANSDERMAL | 0 refills | Status: AC
  Filled 2024-08-03 – 2024-08-04 (×4): qty 10, 30d supply, fill #0

## 2024-08-04 ENCOUNTER — Other Ambulatory Visit: Payer: Self-pay

## 2024-08-04 ENCOUNTER — Other Ambulatory Visit (HOSPITAL_COMMUNITY): Payer: Self-pay

## 2024-08-05 ENCOUNTER — Other Ambulatory Visit (HOSPITAL_COMMUNITY): Payer: Self-pay

## 2024-08-19 ENCOUNTER — Other Ambulatory Visit: Payer: Self-pay

## 2024-08-24 ENCOUNTER — Other Ambulatory Visit (HOSPITAL_COMMUNITY): Payer: Self-pay

## 2024-08-24 MED ORDER — FLUZONE 0.5 ML IM SUSY
PREFILLED_SYRINGE | INTRAMUSCULAR | 0 refills | Status: AC
  Filled 2024-08-24: qty 0.5, 1d supply, fill #0

## 2024-08-30 ENCOUNTER — Other Ambulatory Visit: Payer: Self-pay | Admitting: Nurse Practitioner

## 2024-08-30 DIAGNOSIS — Z1231 Encounter for screening mammogram for malignant neoplasm of breast: Secondary | ICD-10-CM

## 2024-08-31 NOTE — Progress Notes (Unsigned)
 [name] [address] [place] [address] Phone: [phone] Subjective:   ISusannah Gully, am serving as a scribe for Dr. Arthea Claudene.  I'm seeing this patient by the request  of:  McElwee, Lauren A, NP  CC: Neck and back pain  YEP:Dlagzrupcz  LUVERNA DEGENHART is a 63 y.o. female coming in with complaint of back and neck pain. OMT on 06/21/2024. Patient states same per usual. No new symptoms.  Nothing new overall.  Did get a massage and feels like it has been beneficial.  Was able to travel to [place]  and that was helpful as well.  I was able to do a lot of hiking in the woods a little winded but nothing severe.  Medications patient has been prescribed: baclofen   Taking:         Reviewed prior external information including notes and imaging from previsou exam, outside providers and external EMR if available.   As well as notes that were available from care everywhere and other healthcare systems.  Past medical history, social, surgical and family history all reviewed in electronic medical record.  No pertanent information unless stated regarding to the chief complaint.   Past Medical History:  Diagnosis Date   Depression    Endometrial mass    Endometriosis    Fibroid, uterine    GERD (gastroesophageal reflux disease)    Heart murmur    History of adenomatous polyp of colon 2015   History of kidney stones    History of migraine    Hypothyroidism    followed by pcp   Insomnia 08/28/2016   Osteoarthritis 08/28/2016   feet, hands, knees   PMB (postmenopausal bleeding)    Sicca syndrome with keratoconjunctivitis 08/28/2016   Somatic dysfunction    cervical , lumbar, sacral , rib cage region   Vitamin D  deficiency 08/28/2016    Allergies  Allergen Reactions   Compazine [Prochlorperazine Edisylate] Other (See Comments)    Muscle contractions     Review of Systems:  No headache, visual changes, nausea, vomiting, diarrhea,  constipation, dizziness, abdominal pain, skin rash, fevers, chills, night sweats, weight loss, swollen lymph nodes, body aches, joint swelling, chest pain, shortness of breath, mood changes. POSITIVE muscle aches  Objective  Blood pressure 122/88, pulse 86, height 5' 6 (1.676 m), weight 182 lb (82.6 kg), last menstrual period 02/22/2014, SpO2 98%.   General: No apparent distress alert and oriented x3 mood and affect normal, dressed appropriately.  HEENT: Pupils equal, extraocular movements intact  Respiratory: Patient's speak in full sentences and does not appear short of breath  Cardiovascular: No lower extremity edema, non tender, no erythema  Gait relatively normal MSK:  Back does show scapular dyskinesis noted.  Some tenderness to palpation in the paraspinal musculature.  Patient actually has some less tenderness on palpation noted today than some of patient's baseline.  Osteopathic findings C5 flexed rotated and side bent left T3 extended rotated and side bent right inhaled rib T5 extended rotated and side bent left L2 flexed rotated and side bent right L3 flexed rotated and side bent left Sacrum right on right    Assessment and Plan:  Low back pain Multifactorial.  Continue to work on posture and ergonomics, patient does have a lot of stress in her life that I think contributes to some of the discomfort and pain and makes it difficult to get into a regular routine.  Increase activity slowly.  Discussed icing regimen.  Increase  activity slowly.  Follow-up again in 6 to 12 weeks    Nonallopathic problems  Decision today to treat with OMT was based on Physical Exam  After verbal consent patient was treated with HVLA, ME, FPR techniques in cervical, rib, thoracic, lumbar, and sacral  areas  Patient tolerated the procedure well with improvement in symptoms  Patient given exercises, stretches and lifestyle modifications  See medications in patient instructions if  given  Patient will follow up in 4-8 weeks       The above documentation has been reviewed and is accurate and complete Mishawn Hemann M Kaipo Ardis, DO       Note: This dictation was prepared with Dragon dictation along with smaller phrase technology. Any transcriptional errors that result from this process are unintentional.

## 2024-09-01 ENCOUNTER — Encounter: Payer: Self-pay | Admitting: Family Medicine

## 2024-09-01 ENCOUNTER — Ambulatory Visit: Admitting: Family Medicine

## 2024-09-01 VITALS — BP 122/88 | HR 86 | Ht 66.0 in | Wt 182.0 lb

## 2024-09-01 DIAGNOSIS — M545 Low back pain, unspecified: Secondary | ICD-10-CM | POA: Diagnosis not present

## 2024-09-01 DIAGNOSIS — M9904 Segmental and somatic dysfunction of sacral region: Secondary | ICD-10-CM

## 2024-09-01 DIAGNOSIS — G8929 Other chronic pain: Secondary | ICD-10-CM | POA: Diagnosis not present

## 2024-09-01 DIAGNOSIS — M9902 Segmental and somatic dysfunction of thoracic region: Secondary | ICD-10-CM

## 2024-09-01 DIAGNOSIS — M9901 Segmental and somatic dysfunction of cervical region: Secondary | ICD-10-CM

## 2024-09-01 DIAGNOSIS — M9903 Segmental and somatic dysfunction of lumbar region: Secondary | ICD-10-CM | POA: Diagnosis not present

## 2024-09-01 DIAGNOSIS — M9908 Segmental and somatic dysfunction of rib cage: Secondary | ICD-10-CM | POA: Diagnosis not present

## 2024-09-01 NOTE — Patient Instructions (Addendum)
 Good to see you.  Travel is wonderful for you. Keep up with the massages.  See me again in 6 to 8 weeks.

## 2024-09-01 NOTE — Assessment & Plan Note (Signed)
 Multifactorial.  Continue to work on posture and ergonomics, patient does have a lot of stress in her life that I think contributes to some of the discomfort and pain and makes it difficult to get into a regular routine.  Increase activity slowly.  Discussed icing regimen.  Increase activity slowly.  Follow-up again in 6 to 12 weeks

## 2024-09-13 DIAGNOSIS — H3561 Retinal hemorrhage, right eye: Secondary | ICD-10-CM | POA: Diagnosis not present

## 2024-09-16 DIAGNOSIS — H34831 Tributary (branch) retinal vein occlusion, right eye, with macular edema: Secondary | ICD-10-CM | POA: Diagnosis not present

## 2024-09-16 LAB — OPHTHALMOLOGY REPORT-SCANNED

## 2024-09-17 ENCOUNTER — Other Ambulatory Visit (HOSPITAL_COMMUNITY): Payer: Self-pay

## 2024-09-17 ENCOUNTER — Ambulatory Visit
Admission: RE | Admit: 2024-09-17 | Discharge: 2024-09-17 | Disposition: A | Discharge status: Home / Self Care | Source: Ambulatory Visit

## 2024-09-17 ENCOUNTER — Other Ambulatory Visit: Payer: Self-pay

## 2024-09-17 DIAGNOSIS — H3411 Central retinal artery occlusion, right eye: Secondary | ICD-10-CM | POA: Diagnosis not present

## 2024-09-17 DIAGNOSIS — Z1231 Encounter for screening mammogram for malignant neoplasm of breast: Secondary | ICD-10-CM

## 2024-09-17 DIAGNOSIS — R739 Hyperglycemia, unspecified: Secondary | ICD-10-CM | POA: Diagnosis not present

## 2024-09-17 DIAGNOSIS — E663 Overweight: Secondary | ICD-10-CM | POA: Diagnosis not present

## 2024-09-17 MED ORDER — BUPROPION HCL ER (XL) 150 MG PO TB24
150.0000 mg | ORAL_TABLET | Freq: Every day | ORAL | 3 refills | Status: AC
  Filled 2024-09-17: qty 90, 90d supply, fill #0

## 2024-09-17 MED ORDER — PREDNISOLONE ACETATE 1 % OP SUSP
1.0000 [drp] | Freq: Two times a day (BID) | OPHTHALMIC | 6 refills | Status: AC
  Filled 2024-09-17: qty 5, 50d supply, fill #0

## 2024-09-17 MED ORDER — KETOROLAC TROMETHAMINE 0.5 % OP SOLN
1.0000 [drp] | Freq: Two times a day (BID) | OPHTHALMIC | 6 refills | Status: AC
  Filled 2024-09-17: qty 5, 50d supply, fill #0

## 2024-09-17 MED ORDER — SPIRONOLACTONE 25 MG PO TABS
25.0000 mg | ORAL_TABLET | Freq: Every day | ORAL | 1 refills | Status: AC
  Filled 2024-09-17: qty 90, 90d supply, fill #0

## 2024-09-20 ENCOUNTER — Telehealth: Payer: Self-pay | Admitting: Nurse Practitioner

## 2024-09-20 NOTE — Telephone Encounter (Signed)
 Let me know if I should schedule an appt for her.

## 2024-09-20 NOTE — Telephone Encounter (Signed)
 Copied from CRM [id number]. Topic: General - Other >> Sep 20, 2024  1:56 PM Alexandria E wrote: Reason for CRM: Ileana from [facility] called in stating they faxed over a form to have patient seen for blood pressure management and cholesterol management. After review, please reach out to patient to schedule. Fax number for [facility] is [phone] and phone number [phone].

## 2024-09-21 ENCOUNTER — Other Ambulatory Visit (HOSPITAL_BASED_OUTPATIENT_CLINIC_OR_DEPARTMENT_OTHER): Payer: Self-pay | Admitting: Nurse Practitioner

## 2024-09-21 ENCOUNTER — Other Ambulatory Visit (HOSPITAL_COMMUNITY): Payer: Self-pay

## 2024-09-21 DIAGNOSIS — H3411 Central retinal artery occlusion, right eye: Secondary | ICD-10-CM

## 2024-09-21 MED ORDER — WEGOVY 0.5 MG/0.5ML ~~LOC~~ SOAJ
0.5000 mg | SUBCUTANEOUS | 2 refills | Status: AC
  Filled 2024-09-21 – 2024-11-12 (×4): qty 2, 28d supply, fill #0

## 2024-09-22 ENCOUNTER — Encounter (HOSPITAL_COMMUNITY): Payer: Self-pay

## 2024-09-22 ENCOUNTER — Other Ambulatory Visit (HOSPITAL_COMMUNITY): Payer: Self-pay

## 2024-09-22 ENCOUNTER — Other Ambulatory Visit: Payer: Self-pay

## 2024-09-22 ENCOUNTER — Encounter: Payer: Self-pay | Admitting: Pharmacist

## 2024-09-24 ENCOUNTER — Other Ambulatory Visit: Payer: Self-pay

## 2024-09-25 ENCOUNTER — Ambulatory Visit (HOSPITAL_BASED_OUTPATIENT_CLINIC_OR_DEPARTMENT_OTHER)
Admission: RE | Admit: 2024-09-25 | Discharge: 2024-09-25 | Disposition: A | Source: Ambulatory Visit | Attending: Nurse Practitioner | Admitting: Nurse Practitioner

## 2024-09-25 DIAGNOSIS — H3411 Central retinal artery occlusion, right eye: Secondary | ICD-10-CM | POA: Insufficient documentation

## 2024-09-25 DIAGNOSIS — I6523 Occlusion and stenosis of bilateral carotid arteries: Secondary | ICD-10-CM | POA: Diagnosis not present

## 2024-09-25 DIAGNOSIS — H538 Other visual disturbances: Secondary | ICD-10-CM | POA: Diagnosis not present

## 2024-09-26 ENCOUNTER — Other Ambulatory Visit (HOSPITAL_COMMUNITY): Payer: Self-pay

## 2024-09-27 ENCOUNTER — Other Ambulatory Visit: Payer: Self-pay | Admitting: Nurse Practitioner

## 2024-09-27 ENCOUNTER — Other Ambulatory Visit (HOSPITAL_COMMUNITY): Payer: Self-pay

## 2024-09-27 ENCOUNTER — Other Ambulatory Visit: Payer: Self-pay

## 2024-09-27 DIAGNOSIS — R928 Other abnormal and inconclusive findings on diagnostic imaging of breast: Secondary | ICD-10-CM

## 2024-10-01 DIAGNOSIS — H34831 Tributary (branch) retinal vein occlusion, right eye, with macular edema: Secondary | ICD-10-CM | POA: Diagnosis not present

## 2024-10-02 ENCOUNTER — Other Ambulatory Visit

## 2024-10-02 ENCOUNTER — Encounter

## 2024-10-07 ENCOUNTER — Inpatient Hospital Stay
Admission: RE | Admit: 2024-10-07 | Discharge: 2024-10-07 | Attending: Nurse Practitioner | Admitting: Nurse Practitioner

## 2024-10-07 DIAGNOSIS — R928 Other abnormal and inconclusive findings on diagnostic imaging of breast: Secondary | ICD-10-CM

## 2024-10-07 DIAGNOSIS — N6489 Other specified disorders of breast: Secondary | ICD-10-CM | POA: Diagnosis not present

## 2024-10-11 ENCOUNTER — Ambulatory Visit (INDEPENDENT_AMBULATORY_CARE_PROVIDER_SITE_OTHER): Admitting: Family Medicine

## 2024-10-11 ENCOUNTER — Encounter: Payer: Self-pay | Admitting: Family Medicine

## 2024-10-11 VITALS — BP 138/70 | HR 82 | Ht 66.0 in | Wt 180.0 lb

## 2024-10-11 DIAGNOSIS — E038 Other specified hypothyroidism: Secondary | ICD-10-CM | POA: Diagnosis not present

## 2024-10-11 DIAGNOSIS — M9908 Segmental and somatic dysfunction of rib cage: Secondary | ICD-10-CM

## 2024-10-11 DIAGNOSIS — G2589 Other specified extrapyramidal and movement disorders: Secondary | ICD-10-CM

## 2024-10-11 DIAGNOSIS — M9904 Segmental and somatic dysfunction of sacral region: Secondary | ICD-10-CM

## 2024-10-11 DIAGNOSIS — M9902 Segmental and somatic dysfunction of thoracic region: Secondary | ICD-10-CM | POA: Diagnosis not present

## 2024-10-11 DIAGNOSIS — M9901 Segmental and somatic dysfunction of cervical region: Secondary | ICD-10-CM | POA: Diagnosis not present

## 2024-10-11 DIAGNOSIS — I6529 Occlusion and stenosis of unspecified carotid artery: Secondary | ICD-10-CM | POA: Diagnosis not present

## 2024-10-11 DIAGNOSIS — M859 Disorder of bone density and structure, unspecified: Secondary | ICD-10-CM | POA: Diagnosis not present

## 2024-10-11 DIAGNOSIS — H3410 Central retinal artery occlusion, unspecified eye: Secondary | ICD-10-CM | POA: Diagnosis not present

## 2024-10-11 DIAGNOSIS — M9903 Segmental and somatic dysfunction of lumbar region: Secondary | ICD-10-CM | POA: Diagnosis not present

## 2024-10-11 DIAGNOSIS — M545 Low back pain, unspecified: Secondary | ICD-10-CM | POA: Diagnosis not present

## 2024-10-11 DIAGNOSIS — G8929 Other chronic pain: Secondary | ICD-10-CM | POA: Diagnosis not present

## 2024-10-11 LAB — COMPREHENSIVE METABOLIC PANEL WITH GFR
ALT: 29 U/L (ref 0–35)
AST: 20 U/L (ref 0–37)
Albumin: 4.7 g/dL (ref 3.5–5.2)
Alkaline Phosphatase: 110 U/L (ref 39–117)
BUN: 24 mg/dL — ABNORMAL HIGH (ref 6–23)
CO2: 25 meq/L (ref 19–32)
Calcium: 10 mg/dL (ref 8.4–10.5)
Chloride: 106 meq/L (ref 96–112)
Creatinine, Ser: 0.84 mg/dL (ref 0.40–1.20)
GFR: 74.78 mL/min (ref >60.00)
Glucose, Bld: 96 mg/dL (ref 70–99)
Potassium: 4.5 meq/L (ref 3.5–5.1)
Sodium: 138 meq/L (ref 135–145)
Total Bilirubin: 0.4 mg/dL (ref 0.2–1.2)
Total Protein: 7.7 g/dL (ref 6.0–8.3)

## 2024-10-11 LAB — CBC WITH DIFFERENTIAL/PLATELET
Basophils Absolute: 0 K/uL (ref 0.0–0.1)
Basophils Relative: 0.3 % (ref 0.0–3.0)
Eosinophils Absolute: 0.1 K/uL (ref 0.0–0.7)
Eosinophils Relative: 1.3 % (ref 0.0–5.0)
HCT: 42.1 % (ref 36.0–46.0)
Hemoglobin: 14.3 g/dL (ref 12.0–15.0)
Lymphocytes Relative: 31.1 % (ref 12.0–46.0)
Lymphs Abs: 1.2 K/uL (ref 0.7–4.0)
MCHC: 34 g/dL (ref 30.0–36.0)
MCV: 93.6 fl (ref 78.0–100.0)
Monocytes Absolute: 0.4 K/uL (ref 0.1–1.0)
Monocytes Relative: 9.1 % (ref 3.0–12.0)
Neutro Abs: 2.3 K/uL (ref 1.4–7.7)
Neutrophils Relative %: 58.2 % (ref 43.0–77.0)
Platelets: 260 K/uL (ref 150.0–400.0)
RBC: 4.5 Mil/uL (ref 3.87–5.11)
RDW: 12.7 % (ref 11.5–15.5)
WBC: 3.9 K/uL — ABNORMAL LOW (ref 4.0–10.5)

## 2024-10-11 LAB — IBC PANEL
Iron: 102 ug/dL (ref 42–145)
Saturation Ratios: 25.2 % (ref 20.0–50.0)
TIBC: 404.6 ug/dL (ref 250.0–450.0)
Transferrin: 289 mg/dL (ref 212.0–360.0)

## 2024-10-11 LAB — SEDIMENTATION RATE: Sed Rate: 16 mm/h (ref 0–30)

## 2024-10-11 LAB — FERRITIN: Ferritin: 204.4 ng/mL (ref 10.0–291.0)

## 2024-10-11 NOTE — Assessment & Plan Note (Signed)
 See me for chronic problem.  Discussed icing regimen and home exercises, discussed which activities to do and which ones to avoid.  Increase activity slowly.  Discussed icing regimen.  Does respond well to manipulation.  Follow-up again in 6 to 12 weeks.

## 2024-10-11 NOTE — Assessment & Plan Note (Signed)
 Had an occlusion noted.  Has not had further cardiovascular workup other than carotid arteries.  I do feel at this point echocardiogram is necessary and we will do it with bubble study.  Referral to cardiology as well strong family history of cardiovascular disease.

## 2024-10-11 NOTE — Progress Notes (Signed)
 [name] [address] [place] [address] Phone: [phone] Subjective:   Shelly [name] am a scribe for Dr. Claudene.  I'm seeing this patient by the request  of:  Turbyfill, Silvano I, NP  CC: Back and neck pain follow-up  YEP:Dlagzrupcz  Shelly [name] is a 64 y.o. female coming in with complaint of back and neck pain Patient states that her low back is a little catchy. The neck feels pretty good.   Medications patient has been prescribed:   Taking:     Since we have seen patient though patient has been diagnosed with a retinal artery occlusion.  Has had a carotid Doppler that did not show any significant narrowing.  Cholesterol panel does show an LDL above 120.  No chronic headaches, no weakness.    Reviewed prior external information including notes and imaging from previsou exam, outside providers and external EMR if available.   As well as notes that were available from care everywhere and other healthcare systems.  Past medical history, social, surgical and family history all reviewed in electronic medical record.  No pertanent information unless stated regarding to the chief complaint.   Past Medical History:  Diagnosis Date   Depression    Endometrial mass    Endometriosis    Fibroid, uterine    GERD (gastroesophageal reflux disease)    Heart murmur    History of adenomatous polyp of colon 2015   History of kidney stones    History of migraine    Hypothyroidism    followed by pcp   Insomnia 08/28/2016   Osteoarthritis 08/28/2016   feet, hands, knees   PMB (postmenopausal bleeding)    Sicca syndrome with keratoconjunctivitis 08/28/2016   Somatic dysfunction    cervical , lumbar, sacral , rib cage region   Vitamin D  deficiency 08/28/2016    Allergies[1]   Review of Systems:  No headache, visual changes, nausea, vomiting, diarrhea, constipation, dizziness, abdominal pain, skin rash, fevers, chills, night sweats,  weight loss, swollen lymph nodes, body aches, joint swelling, chest pain, shortness of breath, mood changes. POSITIVE muscle aches  Objective  Blood pressure 138/70, pulse 82, height 5' 6 (1.676 m), weight 180 lb (81.6 kg), last menstrual period 02/22/2014, SpO2 97%.   General: No apparent distress alert and oriented x3 mood and affect normal, dressed appropriately.  HEENT: Pupils equal, extraocular movements intact  Respiratory: Patient's speak in full sentences and does not appear short of breath  Cardiovascular: No lower extremity edema, non tender, no erythema  Gait MSK:  Back tightness noted in the paraspinal musculature of right scapular compared to left.  Low back does have tightness noted on both sides of the paraspinal musculature.  No midline tenderness.  Osteopathic findings  C2 flexed rotated and side bent right C6 flexed rotated and side bent left T3 extended rotated and side bent right inhaled rib T9 extended rotated and side bent left L2 flexed rotated and side bent right L3 flexed rotated and side bent left Sacrum right on right     Assessment and Plan:  Scapular dyskinesis See me for chronic problem.  Discussed icing regimen and home exercises, discussed which activities to do and which ones to avoid.  Increase activity slowly.  Discussed icing regimen.  Does respond well to manipulation.  Follow-up again in 6 to 12 weeks.  Low back pain Low back does have some loss of lordosis noted.  Some tenderness to palpation of the paraspinal  musculature.  Some tightness noted with FABER right greater than left.  Will continue to monitor.  Responds well to manipulation.  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  Has not had further cardiovascular workup other than carotid arteries.  I do feel at this point echocardiogram is necessary and we will do it with bubble study.  Referral to cardiology as well strong family history of cardiovascular disease.     Nonallopathic problems  Decision today to treat with OMT was based on Physical Exam  After verbal consent patient was treated with HVLA, ME, FPR techniques in cervical, rib, thoracic, lumbar, and sacral  areas  Patient tolerated the procedure well with improvement in symptoms  Patient given exercises, stretches and lifestyle modifications  See medications in patient instructions if given  Patient will follow up in 4-8 weeks     The above documentation has been reviewed and is accurate and complete Sharece Fleischhacker M Mitsue Peery, DO         Note: This dictation was prepared with Dragon dictation along with smaller phrase technology. Any transcriptional errors that result from this process are unintentional.            [1]  Allergies Allergen Reactions   Compazine [Prochlorperazine Edisylate] Other (See Comments)    Muscle contractions

## 2024-10-11 NOTE — Assessment & Plan Note (Signed)
 Low back does have some loss of lordosis noted.  Some tenderness to palpation of the paraspinal musculature.  Some tightness noted with FABER right greater than left.  Will continue to monitor.  Responds well to manipulation.

## 2024-10-11 NOTE — Patient Instructions (Addendum)
 Good to see you. Referral to Cardiology. Labs on the way out. See me again in 6 to 8 weeks.

## 2024-10-13 ENCOUNTER — Ambulatory Visit: Payer: Self-pay | Admitting: Family Medicine

## 2024-10-13 ENCOUNTER — Encounter: Payer: Self-pay | Admitting: Family Medicine

## 2024-10-13 LAB — ANA: Anti Nuclear Antibody (ANA): POSITIVE — AB

## 2024-10-13 LAB — PTH, INTACT AND CALCIUM
Calcium: 9.4 mg/dL (ref 8.6–10.4)
PTH: 23 pg/mL (ref 16–77)

## 2024-10-13 LAB — ANTI-NUCLEAR AB-TITER (ANA TITER)
ANA TITER: 1:320 {titer} — ABNORMAL HIGH
ANA Titer 1: 1:80 {titer} — ABNORMAL HIGH

## 2024-10-13 LAB — SJOGRENS SYNDROME-A EXTRACTABLE NUCLEAR ANTIBODY: SSA (Ro) (ENA) Antibody, IgG: <1 AI

## 2024-10-14 ENCOUNTER — Other Ambulatory Visit: Payer: Self-pay | Admitting: Family Medicine

## 2024-10-14 DIAGNOSIS — R7689 Other specified abnormal immunological findings in serum: Secondary | ICD-10-CM

## 2024-10-14 NOTE — Telephone Encounter (Signed)
 Referral sent

## 2024-10-19 ENCOUNTER — Other Ambulatory Visit: Payer: Self-pay | Admitting: Family Medicine

## 2024-10-19 ENCOUNTER — Other Ambulatory Visit: Payer: Self-pay

## 2024-10-19 DIAGNOSIS — R7689 Other specified abnormal immunological findings in serum: Secondary | ICD-10-CM

## 2024-10-19 MED ORDER — BACLOFEN 10 MG PO TABS
10.0000 mg | ORAL_TABLET | Freq: Three times a day (TID) | ORAL | 0 refills | Status: AC
  Filled 2024-10-19: qty 30, 10d supply, fill #0

## 2024-10-22 ENCOUNTER — Encounter: Payer: Self-pay | Admitting: Emergency Medicine

## 2024-10-22 ENCOUNTER — Ambulatory Visit

## 2024-10-22 DIAGNOSIS — H3410 Central retinal artery occlusion, unspecified eye: Secondary | ICD-10-CM

## 2024-10-22 LAB — ECHOCARDIOGRAM COMPLETE BUBBLE STUDY
AR max vel: 2.89 cm2
AV Area VTI: 2.69 cm2
AV Area mean vel: 2.79 cm2
AV Mean grad: 6 mmHg
AV Peak grad: 10.9 mmHg
Ao pk vel: 1.65 m/s
Area-P 1/2: 3.91 cm2
Calc EF: 55.5 %
S' Lateral: 2.9 cm
Single Plane A2C EF: 55.8 %
Single Plane A4C EF: 54.6 %

## 2024-10-25 ENCOUNTER — Other Ambulatory Visit: Payer: Self-pay

## 2024-10-25 DIAGNOSIS — Q211 Atrial septal defect, unspecified: Secondary | ICD-10-CM

## 2024-11-11 ENCOUNTER — Other Ambulatory Visit: Payer: Self-pay

## 2024-11-11 ENCOUNTER — Other Ambulatory Visit (HOSPITAL_COMMUNITY): Payer: Self-pay

## 2024-11-12 ENCOUNTER — Other Ambulatory Visit: Payer: Self-pay

## 2024-11-12 ENCOUNTER — Other Ambulatory Visit (HOSPITAL_COMMUNITY): Payer: Self-pay

## 2024-11-12 MED ORDER — LIOTHYRONINE SODIUM 5 MCG PO TABS
5.0000 ug | ORAL_TABLET | Freq: Every morning | ORAL | 1 refills | Status: AC
  Filled 2024-11-12: qty 90, 90d supply, fill #0

## 2024-11-13 ENCOUNTER — Other Ambulatory Visit (HOSPITAL_COMMUNITY): Payer: Self-pay

## 2024-11-25 ENCOUNTER — Other Ambulatory Visit (HOSPITAL_COMMUNITY): Payer: Self-pay

## 2024-11-25 MED ORDER — DORZOLAMIDE HCL-TIMOLOL MAL 2-0.5 % OP SOLN
1.0000 [drp] | Freq: Two times a day (BID) | OPHTHALMIC | 6 refills | Status: AC
  Filled 2024-11-25: qty 10, 100d supply, fill #0

## 2024-11-26 ENCOUNTER — Other Ambulatory Visit (HOSPITAL_COMMUNITY): Payer: Self-pay

## 2024-11-26 ENCOUNTER — Ambulatory Visit: Attending: Cardiovascular Disease | Admitting: Cardiovascular Disease

## 2024-11-26 ENCOUNTER — Encounter: Payer: Self-pay | Admitting: Cardiovascular Disease

## 2024-11-26 VITALS — BP 160/91 | HR 78 | Ht 66.0 in | Wt 177.0 lb

## 2024-11-26 DIAGNOSIS — N29 Other disorders of kidney and ureter in diseases classified elsewhere: Secondary | ICD-10-CM

## 2024-11-26 DIAGNOSIS — H34832 Tributary (branch) retinal vein occlusion, left eye, with macular edema: Secondary | ICD-10-CM | POA: Diagnosis not present

## 2024-11-26 DIAGNOSIS — E039 Hypothyroidism, unspecified: Secondary | ICD-10-CM

## 2024-11-26 DIAGNOSIS — I253 Aneurysm of heart: Secondary | ICD-10-CM | POA: Diagnosis not present

## 2024-11-26 DIAGNOSIS — Q2112 Patent foramen ovale: Secondary | ICD-10-CM | POA: Diagnosis not present

## 2024-11-26 DIAGNOSIS — I10 Essential (primary) hypertension: Secondary | ICD-10-CM

## 2024-11-26 DIAGNOSIS — E7800 Pure hypercholesterolemia, unspecified: Secondary | ICD-10-CM

## 2024-11-26 DIAGNOSIS — R7689 Other specified abnormal immunological findings in serum: Secondary | ICD-10-CM

## 2024-11-26 NOTE — Patient Instructions (Signed)
 Medication Instructions:  No changes *If you need a refill on your cardiac medications before your next appointment, please call your pharmacy*  Keep a BP log daily and send in one week  Lab Work: None ordered If you have labs (blood work) drawn today and your tests are completely normal, you will receive your results only by: MyChart Message (if you have MyChart) OR A paper copy in the mail If you have any lab test that is abnormal or we need to change your treatment, we will call you to review the results.  Testing/Procedures: None ordered  Follow-Up: At [facility], you and your health needs are our priority.  As part of our continuing mission to provide you with exceptional heart care, our providers are all part of one team.  This team includes your primary Cardiologist (physician) and Advanced Practice Providers or APPs (Physician Assistants and Nurse Practitioners) who all work together to provide you with the care you need, when you need it.  Your next appointment:    4-5 months  Provider:   Dr Francyne  We recommend signing up for the patient portal called MyChart.  Sign up information is provided on this After Visit Summary.  MyChart is used to connect with patients for Virtual Visits (Telemedicine).  Patients are able to view lab/test results, encounter notes, upcoming appointments, etc.  Non-urgent messages can be sent to your provider as well.   To learn more about what you can do with MyChart, go to [email or web address].

## 2024-11-27 ENCOUNTER — Encounter: Payer: Self-pay | Admitting: Cardiovascular Disease

## 2024-11-27 NOTE — Progress Notes (Signed)
 " Cardiology Office Note   Date:  11/27/2024  ID:  Shelly [name], DOB 12/30/1960, MRN [id number] PCP: Shlomo Silvano FERNS, NP  [facility] Providers Cardiologist:  None     History of Present Illness Shelly [name] is a 64 y.o. female  who is being seen today for the evaluation of intracardiac shunting at the request of Shelly Arthea HERO, DO..  I have met her before, since her father [name]) was my patient.  He passed away in 12/28/22.  She experienced swelling in her legs following a plane trip to [place] , which was a new symptom for her. During a strenuous hike in [facility] blowhole), she had significant shortness of breath, which she attributed to being out of shape.  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 She has a family history of cardiovascular issues and was informed by her eye doctor that her condition might be cardiovascular-related.  Some of her medical records document central retinal artery occlusion, which could indeed be cardioembolic, but I retrieved the notes from her retina specialist, Dr. 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  He pointed out to her that this is commonly associated with diabetes, hypertension or high cholesterol and recommended the cardiology follow-up.  She underwent a carotid ultrasound and an echocardiogram.  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    She has been started on blood pressure medication (spironolactone ) and sometimes her blood pressure is in the 120s/60s, but often it is over 140/80.  She forgot to bring her blood pressure log to the appointment today.  Keyia has gained weight over the past few years, which she is trying to lose.  She  has started treatment with semaglutide  (Wegovy ) just 2 weeks ago.  She is also taking Wellbutrin  to manage anxiety.  Although there are family members who have had heart trouble, none of these occur at early ages  She has a positive ANA (currently with titer 1: 320, homogenous pattern) and is awaiting a rheumatology consultation to rule out lupus or rheumatoid arthritis. She has a history of a positive ANA from previous tests many years ago, but has not been firmly diagnosed with a specific autoimmune disorder. She is currently using prednisolone  and ketorolac  eye drops, and has started Cosopt  for increased eye pressure.  Her social history includes being a mother and grandmother, with an eight-year-old child and two grandchildren. She has experienced significant family loss, with both parents passing away in recent years.    Studies Reviewed EKG Interpretation Date/Time:  Friday November 26 2024 14:23:05 EST Ventricular Rate:  72 PR Interval:  154 QRS Duration:  80 QT Interval:  374 QTC Calculation: 409 R Axis:   20  Text Interpretation: Normal sinus rhythm Low voltage QRS No previous ECGs available Confirmed by Kacin Dancy ([id number]) on 11/26/2024 2:28:50 PM     Echocardiogram 10/22/2024   1. Left ventricular ejection fraction, by estimation, is 60 to 65%. The  left ventricle has normal function. Left ventricular endocardial border  not optimally defined to evaluate regional wall motion. Left ventricular  diastolic parameters were normal.   2. Right ventricular systolic function is normal. The right ventricular  size is normal.   3. The mitral valve is normal  in structure. Trivial mitral valve  regurgitation. No evidence of mitral stenosis.   4. The aortic valve was not well visualized. Aortic valve regurgitation  is not visualized. No aortic stenosis is present.   5. The inferior vena cava is normal in size with greater than 50%  respiratory variability, suggesting right atrial  pressure of 3 mmHg.   6. Agitated saline contrast bubble study was positive with shunting  observed within 3-6 cardiac cycles suggestive of interatrial shunt.   Comparison(s): No prior Echocardiogram.   Conclusion(s)/Recommendation(s): Consider cardiac MRI for further  evaluation of possible atrial level shunt.   Carotid duplex ultrasound 09/25/2024 Mild amount of plaque at the level of both carotid bulbs. No evidence of ICA plaque or stenosis bilaterally.    Risk Assessment/Calculations   HYPERTENSION CONTROL Vitals:   11/26/24 1419 11/27/24 1608  BP: (!) 140/86 (!) 160/91    The patient's blood pressure is elevated above target today.  In order to address the patient's elevated BP: Blood pressure will be monitored at home to determine if medication changes need to be made.          Physical Exam VS:  BP (!) 160/91   Pulse 78   Ht 5' 6 (1.676 m)   Wt 177 lb (80.3 kg)   LMP 02/22/2014   SpO2 98%   BMI 28.57 kg/m        Wt Readings from Last 3 Encounters:  11/26/24 177 lb (80.3 kg)  10/11/24 180 lb (81.6 kg)  09/01/24 182 lb (82.6 kg)    GEN: Well nourished, well developed in no acute distress NECK: No JVD; No carotid bruits CARDIAC: RRR, no murmurs, rubs, gallops RESPIRATORY:  Clear to auscultation without rales, wheezing or rhonchi  ABDOMEN: Soft, non-tender, non-distended EXTREMITIES:  No edema; No deformity   ASSESSMENT AND PLAN Branch retinal vein occlusion: We discussed the difference between retinal artery occlusion (which could be an embolic event) and retinal vein occlusion (which shares risk factors with many other cardiovascular disorders, but which cannot be embolic).  Diagnosed following a trip to [place] , with symptoms of leg swelling and shortness of breath. Differential diagnosis includes cardiovascular issues, autoimmune conditions, or inflammatory problems. Positive ANA suggests possible autoimmune disorder. Retinal vein occlusion is unlikely  related to leg clots due to anatomical differences between veins and arteries. Paradoxical embolism is considered unlikely as a cause.   Continue follow-up with rheumatologist for autoimmune evaluation, current eye treatment with prednisolone  and ketorolac  drops, Cosopt  for elevated intraocular pressure. Patent foramen ovale: Frequently an incidental finding.  Appeared to be a pertinent abnormality since she was thought to have had an embolic retinal artery occlusion, but it is not actually related to her retinal vein occlusion.  No indication for closure due to lack of significant symptoms or history of stroke. TEE may provide more information but is not deemed necessary at this time. Hypertension: This could well be the cause for her exertional dyspnea.  Started on spironolactone . Blood pressure readings have been variable, with some elevated readings. Blood pressure management is crucial given the potential link to retinal vein occlusion and since it may lead to diastolic dysfunction and heart failure.  Asked her to send me a log of her blood pressure and discussed adding hydrochlorothiazide to spironolactone  for better blood pressure control, if necessary.  Reviewed the appropriate way to collect blood pressure readings after sitting down fully relax for at least 10 minutes. Positive ANA: She has an appointment with Dr. Dolphus in  a couple of months. HLP: Should get updated labs, but over the last 10 to 15 years her typical LDL cholesterol has been only mildly elevated in the 104-112 range, although more elevated in 2024 when it was 129.  He has had a very good HDL cholesterol of around 60 and normal triglycerides.  Would recommend focusing on healthy diet and exercise to bring the LDL cholesterol less than 899, in which case I do not think we need to start medications. Hypothyroidism: On levothyroxine  supplementation, recently adjusting the dose since her TSH was a little elevated.       Dispo:   Patient Instructions  Medication Instructions:  No changes *If you need a refill on your cardiac medications before your next appointment, please call your pharmacy*  Keep a BP log daily and send in one week  Lab Work: None ordered If you have labs (blood work) drawn today and your tests are completely normal, you will receive your results only by: MyChart Message (if you have MyChart) OR A paper copy in the mail If you have any lab test that is abnormal or we need to change your treatment, we will call you to review the results.  Testing/Procedures: None ordered  Follow-Up: At [facility], you and your health needs are our priority.  As part of our continuing mission to provide you with exceptional heart care, our providers are all part of one team.  This team includes your primary Cardiologist (physician) and Advanced Practice Providers or APPs (Physician Assistants and Nurse Practitioners) who all work together to provide you with the care you need, when you need it.  Your next appointment:    4-5 months  Provider:   Dr Francyne  We recommend signing up for the patient portal called MyChart.  Sign up information is provided on this After Visit Summary.  MyChart is used to connect with patients for Virtual Visits (Telemedicine).  Patients are able to view lab/test results, encounter notes, upcoming appointments, etc.  Non-urgent messages can be sent to your provider as well.   To learn more about what you can do with MyChart, go to [email or web address].         Signed, Jerel Francyne, MD   "

## 2024-12-03 NOTE — Progress Notes (Unsigned)
" °  [name] [address] [place] [address] Phone: [phone] Subjective:    I'm seeing this patient by the request  of:  Shlomo Castilla I, NP  CC:   YEP:Dlagzrupcz  Shelly [name] is a 64 y.o. female coming in with complaint of back and neck pain. OMT 10/11/2025. Patient states   Medications patient has been prescribed: Baclofen   Taking:         Reviewed prior external information including notes and imaging from previsou exam, outside providers and external EMR if available.   As well as notes that were available from care everywhere and other healthcare systems.  Past medical history, social, surgical and family history all reviewed in electronic medical record.  No pertanent information unless stated regarding to the chief complaint.   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 POSITIVE muscle aches  Objective  Last menstrual period 02/22/2014.   General: No apparent distress alert and oriented x3 mood and affect normal, dressed appropriately.  HEENT: Pupils equal, extraocular movements intact  Respiratory: Patient's speak  in full sentences and does not appear short of breath  Cardiovascular: No lower extremity edema, non tender, no erythema  Gait MSK:  Back   Osteopathic findings  C2 flexed rotated and side bent right C6 flexed rotated and side bent left T3 extended rotated and side bent right inhaled rib T9 extended rotated and side bent left L2 flexed rotated and side bent right Sacrum right on right       Assessment and Plan:  No problem-specific Assessment & Plan notes found for this encounter.    Nonallopathic problems  Decision today to treat with OMT was based on Physical Exam  After verbal consent patient was treated with HVLA, ME, FPR techniques in cervical, rib, thoracic, lumbar, and sacral  areas  Patient tolerated the procedure well with improvement in symptoms  Patient given exercises, stretches and lifestyle modifications  See medications in patient instructions if given  Patient will follow up in 4-8 weeks             Note: This dictation was prepared with Dragon dictation along with smaller phrase technology. Any transcriptional errors that result from this process are unintentional.            [1]  Allergies Allergen Reactions   Compazine [Prochlorperazine Edisylate] Other (See Comments)    Muscle contractions   "

## 2024-12-06 ENCOUNTER — Ambulatory Visit: Admitting: Family Medicine

## 2025-01-26 ENCOUNTER — Ambulatory Visit: Admitting: Rheumatology

## 2025-02-25 ENCOUNTER — Ambulatory Visit: Admitting: Rheumatology

## 2025-03-22 ENCOUNTER — Ambulatory Visit: Admitting: Cardiovascular Disease
